# Patient Record
Sex: Female | Born: 1950 | Race: White | Hispanic: No | Smoking: Never smoker
Health system: Southern US, Community
[De-identification: ages and names within clinical notes are randomized; demographics above are authoritative.]

## PROBLEM LIST (undated history)

## (undated) DIAGNOSIS — G4733 Obstructive sleep apnea (adult) (pediatric): Secondary | ICD-10-CM

## (undated) DIAGNOSIS — D6851 Activated protein C resistance: Secondary | ICD-10-CM

## (undated) DIAGNOSIS — I10 Essential (primary) hypertension: Secondary | ICD-10-CM

## (undated) DIAGNOSIS — I4819 Other persistent atrial fibrillation: Secondary | ICD-10-CM

## (undated) DIAGNOSIS — Z86718 Personal history of other venous thrombosis and embolism: Secondary | ICD-10-CM

## (undated) DIAGNOSIS — K219 Gastro-esophageal reflux disease without esophagitis: Secondary | ICD-10-CM

## (undated) DIAGNOSIS — E782 Mixed hyperlipidemia: Secondary | ICD-10-CM

## (undated) DIAGNOSIS — Z86711 Personal history of pulmonary embolism: Secondary | ICD-10-CM

## (undated) DIAGNOSIS — Z9289 Personal history of other medical treatment: Secondary | ICD-10-CM

## (undated) DIAGNOSIS — Z9889 Other specified postprocedural states: Secondary | ICD-10-CM

## (undated) DIAGNOSIS — J309 Allergic rhinitis, unspecified: Secondary | ICD-10-CM

## (undated) DIAGNOSIS — Z9981 Dependence on supplemental oxygen: Secondary | ICD-10-CM

## (undated) DIAGNOSIS — M1990 Unspecified osteoarthritis, unspecified site: Secondary | ICD-10-CM

## (undated) HISTORY — DX: Obstructive sleep apnea (adult) (pediatric): G47.33

## (undated) HISTORY — DX: Activated protein C resistance: D68.51

## (undated) HISTORY — DX: Allergic rhinitis, unspecified: J30.9

## (undated) HISTORY — DX: Personal history of pulmonary embolism: Z86.711

## (undated) HISTORY — DX: Essential (primary) hypertension: I10

## (undated) HISTORY — PX: HERNIA REPAIR: SHX51

## (undated) HISTORY — DX: Other persistent atrial fibrillation: I48.19

## (undated) HISTORY — PX: CHOLECYSTECTOMY: SHX55

## (undated) HISTORY — DX: Personal history of other venous thrombosis and embolism: Z86.718

## (undated) HISTORY — DX: Mixed hyperlipidemia: E78.2

## (undated) HISTORY — DX: Other specified postprocedural states: Z98.890

## (undated) HISTORY — PX: GANGLION CYST EXCISION: SHX1691

## (undated) HISTORY — DX: Gastro-esophageal reflux disease without esophagitis: K21.9

## (undated) HISTORY — DX: Unspecified osteoarthritis, unspecified site: M19.90

## (undated) HISTORY — PX: OTHER SURGICAL HISTORY: SHX169

## (undated) HISTORY — PX: ENDOMETRIAL ABLATION: SHX621

## (undated) HISTORY — PX: ROTATOR CUFF REPAIR: SHX139

---

## 1997-04-21 ENCOUNTER — Other Ambulatory Visit: Admission: RE | Admit: 1997-04-21 | Discharge: 1997-04-21 | Payer: Self-pay | Admitting: *Deleted

## 1997-05-17 ENCOUNTER — Inpatient Hospital Stay (HOSPITAL_COMMUNITY): Admission: AD | Admit: 1997-05-17 | Discharge: 1997-05-19 | Payer: Self-pay | Admitting: *Deleted

## 1997-06-16 ENCOUNTER — Inpatient Hospital Stay (HOSPITAL_COMMUNITY): Admission: EM | Admit: 1997-06-16 | Discharge: 1997-06-20 | Payer: Self-pay | Admitting: Internal Medicine

## 1998-09-14 ENCOUNTER — Other Ambulatory Visit: Admission: RE | Admit: 1998-09-14 | Discharge: 1998-09-14 | Payer: Self-pay | Admitting: *Deleted

## 1998-12-04 ENCOUNTER — Ambulatory Visit (HOSPITAL_COMMUNITY): Admission: RE | Admit: 1998-12-04 | Discharge: 1998-12-04 | Payer: Self-pay | Admitting: *Deleted

## 1998-12-04 ENCOUNTER — Encounter: Payer: Self-pay | Admitting: *Deleted

## 1998-12-04 ENCOUNTER — Inpatient Hospital Stay (HOSPITAL_COMMUNITY): Admission: EM | Admit: 1998-12-04 | Discharge: 1998-12-06 | Payer: Self-pay | Admitting: Emergency Medicine

## 1999-02-25 ENCOUNTER — Encounter (INDEPENDENT_AMBULATORY_CARE_PROVIDER_SITE_OTHER): Payer: Self-pay | Admitting: *Deleted

## 1999-02-25 ENCOUNTER — Inpatient Hospital Stay (HOSPITAL_COMMUNITY): Admission: EM | Admit: 1999-02-25 | Discharge: 1999-03-07 | Payer: Self-pay | Admitting: Emergency Medicine

## 1999-02-25 ENCOUNTER — Encounter: Payer: Self-pay | Admitting: *Deleted

## 1999-02-27 ENCOUNTER — Encounter: Payer: Self-pay | Admitting: *Deleted

## 1999-03-22 ENCOUNTER — Encounter: Payer: Self-pay | Admitting: *Deleted

## 1999-03-22 ENCOUNTER — Encounter: Admission: RE | Admit: 1999-03-22 | Discharge: 1999-03-22 | Payer: Self-pay | Admitting: *Deleted

## 1999-11-14 ENCOUNTER — Encounter: Admission: RE | Admit: 1999-11-14 | Discharge: 1999-11-14 | Payer: Self-pay | Admitting: Internal Medicine

## 1999-11-14 ENCOUNTER — Encounter: Payer: Self-pay | Admitting: Internal Medicine

## 2000-01-01 ENCOUNTER — Ambulatory Visit (HOSPITAL_COMMUNITY): Admission: RE | Admit: 2000-01-01 | Discharge: 2000-01-01 | Payer: Self-pay | Admitting: *Deleted

## 2000-03-12 ENCOUNTER — Ambulatory Visit (HOSPITAL_COMMUNITY): Admission: RE | Admit: 2000-03-12 | Discharge: 2000-03-12 | Payer: Self-pay | Admitting: Physician Assistant

## 2000-03-12 ENCOUNTER — Encounter: Payer: Self-pay | Admitting: *Deleted

## 2000-07-02 ENCOUNTER — Encounter: Payer: Self-pay | Admitting: Internal Medicine

## 2000-07-02 ENCOUNTER — Encounter: Admission: RE | Admit: 2000-07-02 | Discharge: 2000-07-02 | Payer: Self-pay | Admitting: Internal Medicine

## 2000-09-02 ENCOUNTER — Encounter: Payer: Self-pay | Admitting: Internal Medicine

## 2000-09-02 ENCOUNTER — Encounter: Admission: RE | Admit: 2000-09-02 | Discharge: 2000-09-02 | Payer: Self-pay | Admitting: Internal Medicine

## 2000-10-21 ENCOUNTER — Encounter: Admission: RE | Admit: 2000-10-21 | Discharge: 2000-10-21 | Payer: Self-pay | Admitting: Neurosurgery

## 2000-10-21 ENCOUNTER — Encounter: Payer: Self-pay | Admitting: Neurosurgery

## 2000-11-28 ENCOUNTER — Inpatient Hospital Stay (HOSPITAL_COMMUNITY): Admission: RE | Admit: 2000-11-28 | Discharge: 2000-12-03 | Payer: Self-pay | Admitting: Orthopedic Surgery

## 2000-11-28 ENCOUNTER — Encounter: Payer: Self-pay | Admitting: Orthopedic Surgery

## 2000-11-28 ENCOUNTER — Encounter (INDEPENDENT_AMBULATORY_CARE_PROVIDER_SITE_OTHER): Payer: Self-pay

## 2001-02-26 ENCOUNTER — Other Ambulatory Visit: Admission: RE | Admit: 2001-02-26 | Discharge: 2001-02-26 | Payer: Self-pay | Admitting: Internal Medicine

## 2001-03-23 ENCOUNTER — Encounter: Admission: RE | Admit: 2001-03-23 | Discharge: 2001-03-23 | Payer: Self-pay | Admitting: Internal Medicine

## 2001-03-23 ENCOUNTER — Encounter: Payer: Self-pay | Admitting: Internal Medicine

## 2001-03-26 ENCOUNTER — Encounter: Admission: RE | Admit: 2001-03-26 | Discharge: 2001-03-26 | Payer: Self-pay | Admitting: Internal Medicine

## 2001-03-26 ENCOUNTER — Encounter: Payer: Self-pay | Admitting: Internal Medicine

## 2001-04-15 ENCOUNTER — Ambulatory Visit (HOSPITAL_COMMUNITY): Admission: RE | Admit: 2001-04-15 | Discharge: 2001-04-15 | Payer: Self-pay | Admitting: Gastroenterology

## 2001-04-15 ENCOUNTER — Encounter (INDEPENDENT_AMBULATORY_CARE_PROVIDER_SITE_OTHER): Payer: Self-pay | Admitting: Specialist

## 2002-02-25 ENCOUNTER — Encounter: Payer: Self-pay | Admitting: Internal Medicine

## 2002-02-25 ENCOUNTER — Encounter: Admission: RE | Admit: 2002-02-25 | Discharge: 2002-02-25 | Payer: Self-pay | Admitting: Internal Medicine

## 2002-04-08 ENCOUNTER — Encounter: Admission: RE | Admit: 2002-04-08 | Discharge: 2002-04-08 | Payer: Self-pay | Admitting: Internal Medicine

## 2002-04-08 ENCOUNTER — Encounter: Payer: Self-pay | Admitting: Internal Medicine

## 2003-04-11 ENCOUNTER — Encounter: Admission: RE | Admit: 2003-04-11 | Discharge: 2003-04-11 | Payer: Self-pay | Admitting: Internal Medicine

## 2003-06-28 ENCOUNTER — Other Ambulatory Visit: Admission: RE | Admit: 2003-06-28 | Discharge: 2003-06-28 | Payer: Self-pay | Admitting: Internal Medicine

## 2003-12-23 ENCOUNTER — Encounter: Admission: RE | Admit: 2003-12-23 | Discharge: 2003-12-23 | Payer: Self-pay | Admitting: Internal Medicine

## 2004-04-11 ENCOUNTER — Encounter: Admission: RE | Admit: 2004-04-11 | Discharge: 2004-04-11 | Payer: Self-pay | Admitting: Internal Medicine

## 2004-05-15 ENCOUNTER — Encounter (INDEPENDENT_AMBULATORY_CARE_PROVIDER_SITE_OTHER): Payer: Self-pay | Admitting: *Deleted

## 2004-05-15 ENCOUNTER — Ambulatory Visit (HOSPITAL_COMMUNITY): Admission: RE | Admit: 2004-05-15 | Discharge: 2004-05-15 | Payer: Self-pay | Admitting: Gastroenterology

## 2005-04-12 ENCOUNTER — Ambulatory Visit (HOSPITAL_COMMUNITY): Admission: RE | Admit: 2005-04-12 | Discharge: 2005-04-13 | Payer: Self-pay | Admitting: Orthopedic Surgery

## 2005-11-12 ENCOUNTER — Ambulatory Visit (HOSPITAL_COMMUNITY): Admission: RE | Admit: 2005-11-12 | Discharge: 2005-11-12 | Payer: Self-pay | Admitting: Internal Medicine

## 2005-11-12 ENCOUNTER — Encounter: Payer: Self-pay | Admitting: Internal Medicine

## 2005-11-21 ENCOUNTER — Encounter: Admission: RE | Admit: 2005-11-21 | Discharge: 2005-11-21 | Payer: Self-pay | Admitting: Obstetrics & Gynecology

## 2005-12-02 ENCOUNTER — Encounter: Admission: RE | Admit: 2005-12-02 | Discharge: 2005-12-02 | Payer: Self-pay | Admitting: Internal Medicine

## 2006-01-06 ENCOUNTER — Encounter (HOSPITAL_COMMUNITY): Admission: RE | Admit: 2006-01-06 | Discharge: 2006-01-07 | Payer: Self-pay | Admitting: Cardiology

## 2006-10-18 ENCOUNTER — Emergency Department (HOSPITAL_COMMUNITY): Admission: EM | Admit: 2006-10-18 | Discharge: 2006-10-18 | Payer: Self-pay | Admitting: Emergency Medicine

## 2006-12-02 ENCOUNTER — Encounter: Admission: RE | Admit: 2006-12-02 | Discharge: 2006-12-02 | Payer: Self-pay | Admitting: Obstetrics & Gynecology

## 2007-04-28 ENCOUNTER — Ambulatory Visit: Payer: Self-pay | Admitting: Internal Medicine

## 2007-04-28 DIAGNOSIS — J209 Acute bronchitis, unspecified: Secondary | ICD-10-CM

## 2007-04-28 DIAGNOSIS — G4733 Obstructive sleep apnea (adult) (pediatric): Secondary | ICD-10-CM

## 2007-04-28 DIAGNOSIS — E662 Morbid (severe) obesity with alveolar hypoventilation: Secondary | ICD-10-CM

## 2007-05-03 DIAGNOSIS — I82409 Acute embolism and thrombosis of unspecified deep veins of unspecified lower extremity: Secondary | ICD-10-CM | POA: Insufficient documentation

## 2007-05-03 DIAGNOSIS — I10 Essential (primary) hypertension: Secondary | ICD-10-CM | POA: Insufficient documentation

## 2007-05-03 DIAGNOSIS — J45909 Unspecified asthma, uncomplicated: Secondary | ICD-10-CM

## 2007-05-03 DIAGNOSIS — I2699 Other pulmonary embolism without acute cor pulmonale: Secondary | ICD-10-CM

## 2007-05-03 DIAGNOSIS — K219 Gastro-esophageal reflux disease without esophagitis: Secondary | ICD-10-CM

## 2007-05-03 DIAGNOSIS — E785 Hyperlipidemia, unspecified: Secondary | ICD-10-CM

## 2007-05-05 ENCOUNTER — Ambulatory Visit: Payer: Self-pay | Admitting: Internal Medicine

## 2007-05-28 ENCOUNTER — Ambulatory Visit: Payer: Self-pay | Admitting: Internal Medicine

## 2007-05-29 ENCOUNTER — Encounter: Payer: Self-pay | Admitting: Internal Medicine

## 2007-05-29 ENCOUNTER — Ambulatory Visit (HOSPITAL_BASED_OUTPATIENT_CLINIC_OR_DEPARTMENT_OTHER): Admission: RE | Admit: 2007-05-29 | Discharge: 2007-05-29 | Payer: Self-pay | Admitting: Internal Medicine

## 2007-05-30 ENCOUNTER — Ambulatory Visit: Payer: Self-pay | Admitting: Internal Medicine

## 2007-06-29 ENCOUNTER — Ambulatory Visit: Payer: Self-pay | Admitting: Internal Medicine

## 2007-07-17 ENCOUNTER — Encounter: Payer: Self-pay | Admitting: Internal Medicine

## 2007-07-29 ENCOUNTER — Ambulatory Visit: Payer: Self-pay | Admitting: Internal Medicine

## 2007-08-11 ENCOUNTER — Encounter: Payer: Self-pay | Admitting: Internal Medicine

## 2007-10-04 ENCOUNTER — Emergency Department (HOSPITAL_COMMUNITY): Admission: EM | Admit: 2007-10-04 | Discharge: 2007-10-05 | Payer: Self-pay | Admitting: Emergency Medicine

## 2007-11-18 ENCOUNTER — Encounter: Admission: RE | Admit: 2007-11-18 | Discharge: 2007-11-18 | Payer: Self-pay | Admitting: Orthopedic Surgery

## 2007-12-04 ENCOUNTER — Encounter: Admission: RE | Admit: 2007-12-04 | Discharge: 2007-12-04 | Payer: Self-pay | Admitting: Internal Medicine

## 2007-12-09 ENCOUNTER — Ambulatory Visit (HOSPITAL_COMMUNITY): Admission: RE | Admit: 2007-12-09 | Discharge: 2007-12-09 | Payer: Self-pay | Admitting: Orthopedic Surgery

## 2007-12-15 ENCOUNTER — Ambulatory Visit: Admission: RE | Admit: 2007-12-15 | Discharge: 2007-12-15 | Payer: Self-pay | Admitting: Orthopedic Surgery

## 2007-12-15 ENCOUNTER — Encounter (INDEPENDENT_AMBULATORY_CARE_PROVIDER_SITE_OTHER): Payer: Self-pay | Admitting: Orthopedic Surgery

## 2007-12-15 ENCOUNTER — Ambulatory Visit: Payer: Self-pay | Admitting: Surgery

## 2008-01-29 ENCOUNTER — Ambulatory Visit: Payer: Self-pay | Admitting: Internal Medicine

## 2008-07-31 ENCOUNTER — Encounter: Payer: Self-pay | Admitting: Internal Medicine

## 2008-12-11 ENCOUNTER — Emergency Department (HOSPITAL_COMMUNITY): Admission: EM | Admit: 2008-12-11 | Discharge: 2008-12-11 | Payer: Self-pay | Admitting: Emergency Medicine

## 2008-12-13 ENCOUNTER — Emergency Department (HOSPITAL_COMMUNITY): Admission: EM | Admit: 2008-12-13 | Discharge: 2008-12-13 | Payer: Self-pay | Admitting: Emergency Medicine

## 2008-12-30 ENCOUNTER — Encounter: Admission: RE | Admit: 2008-12-30 | Discharge: 2008-12-30 | Payer: Self-pay | Admitting: Internal Medicine

## 2009-05-04 ENCOUNTER — Ambulatory Visit (HOSPITAL_COMMUNITY): Admission: RE | Admit: 2009-05-04 | Discharge: 2009-05-04 | Payer: Self-pay | Admitting: Gastroenterology

## 2009-08-10 ENCOUNTER — Encounter: Payer: Self-pay | Admitting: Internal Medicine

## 2009-09-21 ENCOUNTER — Encounter: Payer: Self-pay | Admitting: Internal Medicine
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
## 2010-02-22 NOTE — Medication Information (Signed)
Summary: [name]  [name]   Imported By: Sherian Rein 08/16/2009 08:26:34  _____________________________________________________________________  External Attachment:    Type:   Image     Comment:   External Document

## 2010-02-22 NOTE — Letter (Signed)
Summary: [name]  [name]   Imported By: Lester [place] 08/23/2009 10:02:55  _____________________________________________________________________  External Attachment:    Type:   Image     Comment:   External Document

## 2010-02-22 NOTE — Letter (Signed)
Summary: [name]  [name]   Imported By: Sherian Rein 10/02/2009 08:07:37  _____________________________________________________________________  External Attachment:    Type:   Image     Comment:   External Document

## 2010-03-13 ENCOUNTER — Encounter: Payer: Self-pay | Admitting: Internal Medicine

## 2010-03-20 ENCOUNTER — Ambulatory Visit
Admission: RE | Admit: 2010-03-20 | Discharge: 2010-03-20 | Disposition: A | Discharge status: Home / Self Care | Payer: Medicare HMO | Source: Ambulatory Visit | Attending: Internal Medicine | Admitting: Internal Medicine

## 2010-03-20 ENCOUNTER — Other Ambulatory Visit: Payer: Self-pay | Admitting: Internal Medicine

## 2010-03-20 DIAGNOSIS — R0602 Shortness of breath: Secondary | ICD-10-CM

## 2010-03-20 MED ORDER — IOHEXOL 350 MG/ML SOLN
125.0000 mL | Freq: Once | INTRAVENOUS | Status: AC | PRN
  Administered 2010-03-20: 125 mL via INTRAVENOUS

## 2010-03-22 ENCOUNTER — Encounter: Payer: Self-pay | Admitting: Internal Medicine

## 2010-03-27 ENCOUNTER — Encounter: Payer: Self-pay | Admitting: Internal Medicine

## 2010-03-27 ENCOUNTER — Ambulatory Visit (INDEPENDENT_AMBULATORY_CARE_PROVIDER_SITE_OTHER): Payer: Medicare HMO | Admitting: Internal Medicine

## 2010-03-27 DIAGNOSIS — J9611 Chronic respiratory failure with hypoxia: Secondary | ICD-10-CM | POA: Insufficient documentation

## 2010-03-27 DIAGNOSIS — I2699 Other pulmonary embolism without acute cor pulmonale: Secondary | ICD-10-CM

## 2010-03-27 DIAGNOSIS — R0602 Shortness of breath: Secondary | ICD-10-CM

## 2010-03-27 DIAGNOSIS — G4730 Sleep apnea, unspecified: Secondary | ICD-10-CM

## 2010-04-03 NOTE — Assessment & Plan Note (Signed)
Summary: sob per dr [place] Cross office/[facility]   Copy to:  Donette Larry Primary Provider/Referring Provider:  Ginette Otto  CC:  Increased SOB x 2 weeks and getting worse; Dr Mayford Knife suggested pt be seen.Marland Kitchen  History of Present Illness:  07/29/07-  60 year old, morbidly obese woman returning for follow-up of asthmatic bronchitis and chronic respiratory failure, complicated by a history of pulmonary embolism, and obesity/hypoventilation syndrome.  Her husband is with her.  She uses oxygen continuously.  Comfortable with CPAP at 7 CWP.  She sleeps comfortably using oxygen with her CPAP.  Questions if she might be catching a cold, has started a little cough and wheeze.  She denies sore throat, fever, purulent discharge, chest pain or palpitation, GI or GU upset.  01/29/08- Asthmatic bronchitis, Hx PE, OSA, Morbid obesity    Husband with her. Has had seasonal flu vax only. Some morning wheeze past few days - mild. Has been doing well without routine cogh or phlegm. denies chest pain, palpitation, fever.  Over past month is getting up to bathroom more at night. Her autotitration cpap is set range 7-9 cwp. She is able to remain compliant, using it every night.  March 27, 2010-  Asthmatic bronchitis, Hx PE, OSA, Morbid obesity     Nurse-CC: Increased SOB x 2 weeks and getting worse; Dr Mayford Knife suggested pt be seen. Since first of this year she has been gradually more short of breath. By 2 weeks ago when she went to Dr Eula Listen, she was hoarse w/o cold, chest described as clear. CT scan was clear. He increased her O2 to 3 L, gave prednisone taper (finished) and sent to Dr Laurel Dimmer. Echocardiogram and EKG  "ok". Getting better, but still band-like tightness around chest, increased work of breathing. Denies sore throat, fever, cough or phlegm. Chest did feel acheiness though chest, relieved by neb trreatment. Denies blood, nausea or vomiting. Does get heartburn- takes pepcid 2-3x daily. Wheezes routinely some with exertion,  otw clear.      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 One Inhalation Two Times A Day 2)  Allegra 180 Mg  Tabs (Fexofenadine Hcl) .... Q Day As Needed 3)  Albuterol 90 Mcg/act  Aers (Albuterol) .... As Needed 4)  Albuterol Sulfate 1.25 Mg/55ml  Nebu (Albuterol Sulfate) .... As Needed 5)  Pravastatin Sodium 80 Mg  Tabs (Pravastatin Sodium) .... Take 1 Tablet By Mouth Once A Day 6)  Flonase 50 Mcg/act  Susp (Fluticasone Propionate) .... Two Sprays Daily 7)  Furosemide 20 Mg  Tabs (Furosemide) .... As Needed 8)  Diltiazem Hcl Coated Beads 300 Mg  Cp24 (Diltiazem Hcl Coated  Beads) .... Take 1 Tablet By Mouth Once A Day 9)  Pepcid 20 Mg  Tabs (Famotidine) .... As Needed 10)  Coumadin 5 Mg  Tabs (Warfarin Sodium) .... 2 Tabs Daily Use As Directed 11)  Cpap 7 12)  Oxygen 2 L/m 13)  Pulmicort 0.5 Mg/62ml  Susp (Budesonide) .... Inhaled As Needed  Current Medications (verified): 1)  Advair Diskus 500-50 Mcg/dose  Misc (Fluticasone-Salmeterol) .... One Inhalation Two Times A Day 2)  Allegra 180 Mg  Tabs (Fexofenadine Hcl) .... Q Day As Needed 3)   Albuterol 90 Mcg/act  Aers (Albuterol) .... As Needed 4)  Albuterol Sulfate 1.25 Mg/70ml  Nebu (Albuterol Sulfate) .... As Needed 5)  Pravastatin Sodium 80 Mg  Tabs (Pravastatin Sodium) .... Take 1 Tablet By Mouth Once A Day 6)  Flonase 50 Mcg/act  Susp (Fluticasone Propionate) .... Two Sprays Daily 7)  Furosemide 20 Mg  Tabs (Furosemide) .... As Needed 8)  Diltiazem Hcl Coated Beads 300 Mg  Cp24 (Diltiazem Hcl Coated Beads) .... Take 1 Tablet By Mouth Once A Day 9)  Pepcid 20 Mg  Tabs (Famotidine) .... As Needed 10)  Coumadin 5 Mg  Tabs (Warfarin Sodium) .... 2 Tabs Daily Use As Directed 11)  Cpap 7 .... [facility] 12)  Oxygen 3 L/m .... [facility]  Allergies (verified): No Known Drug Allergies  Past History:  Past Medical History: Last updated: 06/29/2007 ASTHMA (ICD-493.90) BRONCHITIS (ICD-490) Hx of PULMONARY EMBOLISM (ICD-415.19) Hx of DEEP VENOUS THROMBOPHLEBITIS, CHRONIC (ICD-453.40) OBESITY HYPOVENTILATION SYNDROME (ICD-278.8) SLEEP APNEA (ICD-780.57) NPSG 05/29/07 AHI 32.9, cpap 7- AHI 0/hr weight 382. ESSENTIAL HYPERTENSION, BENIGN (ICD-401.1) HYPERLIPIDEMIA (ICD-272.4) GERD (ICD-530.81)    Past Surgical History: Last updated: 04/28/2007 IVC filter Ganglion cyst Hernia repair hernia abscess I&D hernia incisional abscess Endometrial ablation Rotator cuff  Family History: Last updated: 06/29/2007 Allergies Cancer Family History Asthma Family History Coronary Heart Disease Family History COPD   Mother- deceased age 45; thyroid cancer Father- deceased age 62; heart attack 62 Siblings- 1 deceased  Social History: Last updated: 03/27/2010 Patient states former smoker only as a teen for 6 months widowed ( cirrhosis) disability   Risk Factors: Smoking Status: never (03/27/2010)  Social History: Patient states former smoker only as a teen for 6 months widowed ( cirrhosis) disability  Smoking Status:  never  Review of Systems       See HPI       The patient complains of shortness of breath with activity, acid heartburn, and indigestion.  The patient denies shortness of breath at rest, productive cough, non-productive cough, coughing up blood, chest pain, irregular heartbeats, loss of appetite, weight change, abdominal pain, difficulty swallowing, sore throat, tooth/dental problems, headaches, nasal congestion/difficulty breathing through nose, and sneezing.    Vital Signs:  Patient profile:   60 year old female Height:      68 inches Weight:      398.50 pounds BMI:     60.81 O2 Sat:      99 % on 3 L/min Pulse rate:   74 / minute BP sitting:   134 / 78  (left arm) Cuff size:   wrist-lg cuff  Vitals Entered By: Reynaldo Minium CMA (March 27, 2010 11:35 AM)  O2 Flow:  3 L/min CC: Increased SOB x 2 weeks and getting worse; Dr Mayford Knife suggested pt be seen.   Physical Exam  Additional Exam:  General: A/Ox3; pleasant and cooperative, NAD, morbidly obese. supplemental oxygen- 2 L/M demand valve 99%sat SKIN:  no rash, lesions NODES: no lymphadenopathy HEENT: [place]/AT, EOM- WNL, Conjuctivae- clear, PERRLA, TM-WNL, Nose- clear, Throat- clear and wnl, partial.  NECK: Supple w/ fair ROM, JVD- none, normal carotid impulses w/o bruits Thyroid- normal to palpation CHEST: Clear to P&A HEART: RRR, no m/g/r heard ABDOMEN: Massive pannus ZOX:WRUE, nl pulses, no edema  NEURO: Grossly intact to observation      Impression & Recommendations:  Problem # 1:  DYSPNEA (ICD-786.05)  Recent sustained increased dyspnea over a two month period, slowly improving. Absent exacerbation of her several known related medical problems, this may have been a viral bronchits, without much wheesze or cough. It may have been nonrespiratory- she admits ongoing GERD symptoms at times, and may hve been feeling an esophagitis. The latter would fit with negative cardiac and PE testing, and with clear lungs on exams. I will have her be more aggressive  with Pepcid next 3 weeks and watch for progress.   Problem # 2:  SLEEP APNEA (ICD-780.57)  Good complince and control with CPAP.   Problem # 3:  Hx of PULMONARY EMBOLISM (ICD-415.19) No recurrence evident. If no other explanation for dyspnea becomes available, then we will have to reconsider.  Her updated medication list for this problem includes:    Coumadin 5 Mg Tabs (Warfarin sodium) .Marland Kitchen... 2 tabs daily use as directed  Medications Added to Medication List This Visit: 1)  Cpap 7  .... [name] 2)  Oxygen 3 L/m  .... [name]  Other Orders: Est. Patient Level IV (45409)  Patient Instructions: 1)  Please schedule a follow-up appointment in 1 year.Please call sooner as needed. 2)  Some of the chest discomfort might be from reflux. Try taking the Pepcid 3 times daily x 2 weeks, then drop back to once daily.

## 2010-04-10 NOTE — Letter (Signed)
Summary: [facility]  [facility]   Imported By: Sherian Rein 04/03/2010 08:27:18  _____________________________________________________________________  External Attachment:    Type:   Image     Comment:   External Document

## 2010-04-25 LAB — DIFFERENTIAL
Basophils Absolute: 0 10*3/uL (ref 0.0–0.1)
Basophils Relative: 0 % (ref 0–1)
Eosinophils Relative: 1 % (ref 0–5)
Monocytes Absolute: 0.4 10*3/uL (ref 0.1–1.0)
Monocytes Relative: 7 % (ref 3–12)
Neutro Abs: 3.9 10*3/uL (ref 1.7–7.7)

## 2010-04-25 LAB — BASIC METABOLIC PANEL
CO2: 26 mEq/L (ref 19–32)
Chloride: 107 mEq/L (ref 96–112)
GFR calc Af Amer: 57 mL/min — ABNORMAL LOW (ref >60)
Glucose, Bld: 108 mg/dL — ABNORMAL HIGH (ref 70–99)
Potassium: 4.3 mEq/L (ref 3.5–5.1)
Sodium: 140 mEq/L (ref 135–145)

## 2010-04-25 LAB — CBC
HCT: 36.3 % (ref 36.0–46.0)
Hemoglobin: 12 g/dL (ref 12.0–15.0)
MCHC: 33 g/dL (ref 30.0–36.0)
MCV: 91.8 fL (ref 78.0–100.0)
RBC: 3.95 MIL/uL (ref 3.87–5.11)
RDW: 14 % (ref 11.5–15.5)

## 2010-06-05 NOTE — Procedures (Signed)
NAME:  Rhonda Acevedo, Rhonda Acevedo NO.:  [id number]   MEDICAL RECORD NO.:  [id number]          PATIENT TYPE:  OUT   LOCATION:  SLEEP CENTER                 FACILITY:  [facility]   PHYSICIAN:  Clinton D. Maple Hudson, MD, FCCP, FACPDATE OF BIRTH:  1950-06-15   DATE OF STUDY:  05/29/2007                            NOCTURNAL POLYSOMNOGRAM   REFERRING PHYSICIAN:  Clinton D. Young, MD, FCCP, FACP   INDICATION FOR STUDY:  Hypersomnia with sleep apnea.   EPWORTH SLEEPINESS SCORE:  10/24.   BMI:  58.   WEIGHT:  382 pounds.   HEIGHT:  68 inches.   NECK:  18-1/2 inches.   HOME MEDICATIONS:  Charted and reviewed.   SLEEP ARCHITECTURE:  Split study protocol.  During the diagnostic phase,  total sleep time was 120.5 minutes with sleep efficiency 67.1%.  Stage 1  was 13.3%.  Stage 2 was 86.7%.  Stage 3 was absent.  REM was absent.  Sleep latency 15.5 minutes.  Awake after sleep onset 43.5 minutes.  Arousal index 22.9.  Albuterol nebulizer was taken at bedtime.   RESPIRATORY DATA:  Split study protocol.  Apnea/hypopnea index (AHI)  32.9, indicating moderate obstructive sleep apnea/hypopnea syndrome.  Sixty-six events were scored, including 11 obstructive apneas and 55  hypopneas.  Events were more common while nonsupine.  CPAP was titrated  to 7 CWP, AHI 0.4 per hour.  A small Mirage Quattro mask was used with  heated humidifier.   OXYGEN DATA:  Snoring was suppressed by CPAP but was loud before CPAP.  She arrived on oxygen at 2 L.  She stated that she only used oxygen when  moving around a lot.  The study was performed on room air.  Before CPAP,  oxygen desaturation to a nadir of 80%.  After CPAP titration, mean  oxygen saturation 90.7% on room air.   CARDIAC DATA:  Normal sinus rhythm.   MOVEMENT/PARASOMNIA:  No significant movement disturbance.  Bathroom x1.   IMPRESSIONS/RECOMMENDATIONS:  1. Moderate obstructive sleep apnea/hypopnea syndrome, AHI 32.9 per      hour with events more  common while nonsupine.  Loud snoring with      oxygen desaturation to a nadir of 80%.  2. Successful CPAP titration to 7 CWP, AHI 0.4 per hour.  She chose a      small Mirage Quattro mask with heated humidifier.  3. She arrived wearing oxygen at 2 L but said she did not need not it      while not physically active.  The study was performed on room air.      Oxygen desaturated before CPAP to a nadir of 80% with a mean of      90.1%.  After CPAP titration, mean oxygen saturation was 90.7% on      room air.      Clinton D. Maple Hudson, MD, [facility], FACP  Diplomate, Biomedical engineer of Sleep Medicine  Electronically Signed     CDY/MEDQ  D:  05/30/2007 08:37:23  T:  05/30/2007 09:01:22  Job:  [id number]

## 2010-06-05 NOTE — Op Note (Signed)
NAMESAKINAH, [name]               ACCOUNT NO.:  [id number]   MEDICAL RECORD NO.:  [id number]          PATIENT TYPE:  AMB   LOCATION:  SDS                          FACILITY:  [facility]   PHYSICIAN:  Harvie Junior, M.D.   DATE OF BIRTH:  16-Aug-1950   DATE OF PROCEDURE:  12/09/2007  DATE OF DISCHARGE:                               OPERATIVE REPORT   PREOPERATIVE DIAGNOSIS:  Medial meniscal tear.   POSTOPERATIVE DIAGNOSES:  1. Chondral defect of medial femoral condyle.  2. Chondromalacia of the patellofemoral joint.  3. Lateral meniscal tear with chondromalacia of lateral tibial      plateau.   PROCEDURE:  1. Arthroscopic knee surgery with debridement of lateral tibial      plateau and lateral meniscal tear.  2. Arthroscopic debridement of medial femoral condyle.  3. Arthroscopic debridement of patellofemoral joint.   SURGEON:  Harvie Junior, M.D.   ASSISTANT:  Marshia Ly, P.A.   ANESTHESIA:  General.   BRIEF HISTORY:  [name] is a 60 year old female with long history of  having had significant left knee pain, was treated conservatively  including activity modification, drapes and therapy.  Because of all  this failure, she was ultimately taken to the operating room for  operative knee arthroplasty.  Preoperative MRI showed questionable  medial meniscal tear with chondral problems on both medial and  patellofemoral joint.   PROCEDURE:  The patient was taken to the operating room.  After adequate  anesthesia obtained with a general anesthetic, the patient was placed  supine on the operating table.  The left lower extremity was prepped and  draped in usual sterile fashion.  Following this, a routine arthroscopic  examination of knee revealed that there was obvious chondromalacia of  the patellofemoral joint.  This was debrided back to a smooth and stable  rim.  Attention was turned to the medial compartment where there was  flaps of cartilage on the medial femoral condyle,  which were debrided  back to a smooth and stable rim.  Medial meniscus was probed at length  and felt to be stable. There was some grade 3-4 change right on the  medial tibial plateau as well, but the medial meniscus was allowed to be  left at this time.  Attention was turned towards the lateral compartment  where there was partial lateral meniscectomy which was performed as well  as debridement of the lateral tibial plateau.  At this time attention  was drawn back on the patellofemoral joint where the tracking was  midline.  There was no significant pressure on patellofemoral joint, so  we just debrided the remainder of the patellofemoral joint.  At this  point, the knee was copiously and thoroughly  irrigated, suctioned dry.  All sterile portals were closed with a  bandage.  Sterile compression dressing was applied.  The patient was  taken to the recovery room and was noted to be in satisfactory  condition.  Estimated blood loss for procedure was about __________      Harvie Junior, M.D.  Electronically Signed     JLG/MEDQ  D:  12/09/2007  T:  12/09/2007  Job:  [id number]

## 2010-06-08 NOTE — H&P (Signed)
[facility]  Patient:    Rhonda Acevedo, Rhonda Acevedo Visit Number: [id number] MRN: [id number]          Service Type: Attending:  Elisha Ponder, M.D. Dictated by:   Alexzandrew L. Julien Girt, P.A.-C.   CC:         Tyson Dense, M.D.   History and Physical  CHIEF COMPLAINT:  Right wrist pain.  HISTORY OF PRESENT ILLNESS:  The patient is a 60 year old female who has been seen and evaluated by Dr. Onalee Hua for right wrist pain.  The pain has been ongoing for several months now, since February 2002.  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  Specific arrangements had to be made with her medical physician, Dr. Eula Listen, due to the fact of limiting the amount of time where she is not anticoagulated due to her significant history.  The pain has been ongoing for some time now, and she has elected to proceed with surgical intervention.  Arrangements were made for the patient to be admitted prior to her procedure and taken off her Coumadin and undergo concurrent coverage with Lovenox until the day of her procedure.  The risks and benefits of this procedure have been discussed with the patient, and she has elected to proceed with surgery.  ALLERGIES:  No known drug allergies.  CURRENT MEDICATIONS: 1. She has been on Coumadin 10 mg/7.5 mg alternating doses on alternating    days. 2. Detrol LA 4 mg daily. 3. Advair 250/50 1 puff twice a day. 4. Humibid L.A./guaifenesin 600 mg twice a day. 5. Septra DS p.o. b.i.d. for seven days. 6. Ferrous sulfate 325 mg twice a day.  PAST MEDICAL HISTORY: 1. Recently diagnosed with urinary incontinence with a stress urinary    incontinent component. 2. Anemia. 3. Reflux disease. 4. Varicose veins. 5. Pulmonary embolism x 2.  6. Factor V deficiency.  7. Asthma.  8.  Degenerative disk disease.  9. Diagnosed with bursitis in her shoulders and knees and, also, arthritis     in her hips. 10. History of sleep apnea.  PAST SURGICAL HISTORY:  Cholecystectomy.  SOCIAL HISTORY:  She is married, has two children.  Denies use of tobacco products or alcohol products.  FAMILY HISTORY:  Mother deceased in her 56s with cancer.  Father deceased in his 91s with heart attack and diabetes.  REVIEW OF SYSTEMS:  GENERAL:  No fevers, chills, night sweats.  NEUROLOGIC: No seizures, syncope, paralysis.  RESPIRATORY:  She has been recently placed on antibiotics secondary to bronchitis.  She has had a recent cough secondary to bronchitis with some intermittent shortness of breath.  No hemoptysis. CARDIOVASCULAR:  No chest pain, angina, or orthopnea.  GASTROINTESTINAL:  No nausea, vomiting, diarrhea, constipation.  No blood or mucous in the stool. GENITOURINARY:  She does have some urgency and some frequency, with a history of urinary incontinence.  She is currently on Detrol.  She states she has improved since the initiation of this medication.  No dysuria or hematuria. MUSCULOSKELETAL:  Pertinent to that of the wrist found in the history of present illness.  PHYSICAL EXAMINATION:  VITAL SIGNS:  Pulse 88, respirations 18, blood pressure 150/88.  GENERAL:  [age]-year-old white female, overweight/obese.  Alert, oriented, and cooperative.  Very pleasant at the time of exam.  In no acute distress.  HEENT:  Normocephalic, atraumatic.  Pupils are round and reactive.  EOMs are intact.  NECK:  Supple.  No carotid bruits.  CHEST:  Some fine crackles noted in the bases bilaterally.  There is no rhonchi, rales, and no appreciated wheezing on exam.  HEART:  Regular rate and rhythm.  Distant heart sounds are noted.   S1, S2 noted.  ABDOMEN:  Faint bowel sounds but present.  Nontender.  Large, round, protuberant abdomen.  RECTAL, GENITOURINARY:  Not done, not  pertinent to present illness.  EXTREMITIES:  Limited to that of the right upper extremity.  Right wrist is examined.  She has normal pulses on exam.  Good sensation throughout the right hand, and no signs of dystrophic changes.  She does have a volar carpal ganglion.  IMPRESSION:  1. Right wrist volar mass.  2. Pulmonary embolism x 2.  3. Factor V deficiency.  4. Reflux disease.  5. Anemia.  6. Urinary incontinence.  7. Asthma.  8. Recent bout of bronchitis.  9. Varicose veins. 10. Degenerative disk disease. 11. History of bursitis. 12. History of sleep apnea. 13. History of arthritis.  PLAN:  The patient will be admitted to [facility] to undergo removal of the right wrist mass.  She will be admitted on November 28, 2000. Dr. Eula Listen will be consulted to assist with her anticoagulation of Lovenox until which time her protime comes down after being on Coumadin.  Her Coumadin will be stopped on Thursday.  She will be admitted on Friday and undergo coverage for Lovenox.  Once her protimes are within normal limits, the Lovenox will be stopped, and she will undergo removal of the right mass, tentatively planned for Tuesday, December 02, 2000. Dictated by:   Alexzandrew L. Perkins, P.A.-C. Attending:  Elisha Ponder, M.D. DD:  11/26/00 TD:  11/27/00 Job: [id number]

## 2010-06-08 NOTE — H&P (Signed)
[place]  Patient:    Rhonda, Acevedo                        MRN: [id number] Dictator:   Demetria Pore. Coral Spikes, M.D. CC:         Demetria Pore. Coral Spikes, M.D.             Lum Babe, M.D.                         History and Physical  CHIEF COMPLAINT:  The patient is a 60 year old right handed white female with a  chief complaint of "under my right breast", admitted with nausea and right upper quadrant and right lower chest area pain, ? etiology, initially to rule out gallbladder disease, for further evaluation and treatment.  PROBLEMS: 1.  Right upper quadrant area pain and right lower chest pain, ? etiology;     rule out gallbladder, ? other. 2.  Three to four week history of nausea without abdominal pain.  HISTORY OF PRESENT ILLNESS:  The patient called February 21, 1999 and states called in Phenergan, which did not help.  The patient was seen in the office on February 23, 1999 by Bridgett and blood was done, ? results, and ultrasound was scheduled for February 26, 1999 of the gallbladder.  On February 24, 1999 at 8 p.m. she had onset of a dull constant right upper quadrant and right lower chest pain that she describes below her right breast that goes through to her back but not to her shoulder, 8/10.  Nothing makes it better or worse.  She took no medication.  After two hours she called and I directed her o the ER.  I saw the patient in the emergency room.  Temperature was 97.8, blood pressure 156/91, pulse 91, respiratory rate 18.  Oxygen saturation 100%.  She was still having right upper quadrant and right lower chest area pain.  It was not going o the shoulder and there was no left chest pain.  Four weeks ago she had vomited nce with diarrhea.  No shortness of breath.  She was tender in the right upper quadrant.  I elected to admit for further evaluation.  The patient has a history of factor V leiden with DVT and pulmonary  embolus. She is also obese, with a history of hypoventilation.  She has asthma.  She was not  wheezing.  The pain was not worse with a deep breath.  She had no calf pain.  ALLERGIES:  None.  MEDICATIONS: 1.  Coumadin 7.5 mg Sunday and Wednesday and 10 mg the other days. 2.  Pulmicort inhaler 200 mcg 2 puffs b.i.d. 3.  Volmax 8 mg 1 b.i.d. 4.  Theophylline ER 300 mg 1 t.i.d., but did not take her third dose this evening. 5.  Claritin 10 mg q.d. 6.  Astelin 2 nose sprays b.i.d. p.r.n. 7.  Iron sulfate 325 mg q.d. 8.  Albuterol inhaler or hand-held nebulizer.  PAST SURGICAL HISTORY:  Gravida 3, para 2, one abortion.  PAST MEDICAL HISTORY: 1.  Factor V leiden deficiency. 2.  In 1993 right and left DVT treated with Coumadin and stopped. 3.  In 1995 right and left DVT and pulmonary embolus, on chronic Coumadin since     that time. 4.  Asthma. 5.  Obesity with hypoventilation, followed by Dr. Maple Hudson. 6.  November 2000, hospitalized with chest pain of unknown origin.  Lung scan     negative and Doppler of legs negative. 7.  History of iron deficiency anemia from menses. 8.  Allergy to cat, perfume, and fumes can make her asthma worse. 9.  History of sleep apnea, although she said on retesting did not have that.     Uses a cane because of disk disease in her back as well as knee and hips feel     like they give out.  Sees Dr. Charlett Blake and a chiropractor. 10. Question of right carpal tunnel syndrome in the past, asymptomatic now.  FAMILY/SOCIAL HISTORY:  Married, two children.  Does not smoke, does not drink alcohol.  On social security disability.  Family history of DVT, heart disease,  coronary artery disease, diabetes, cancer of the larynx, brain aneurysm, and colon cancer in brother.  REVIEW OF SYSTEMS:  Last normal menstrual period three weeks ago.  All other systems negative.  PHYSICAL EXAMINATION:  GENERAL:  Alert, no acute distress.  VITAL SIGNS:  Temperature 97.8  degrees, blood pressure 156/91, pulse 91, respiratory rate 18.  SKIN:  No rash, not sweaty.  HEENT:  Eyes:  Conjunctiva pink.  Sclerae white.  PERRL.  Fundi normal.  ENT unremarkable.  NECK:  Supple.  Thyroid not enlarged.  No bruit, no adenopathy.  BREAST:  Large and pendulous without gross mass.  Chaperoned by nurse.  LUNGS:  Clear to auscultation and percussion.  No CVA tenderness.  HEART:  Regular rhythm without murmurs, rubs, or gallops.  ABDOMEN:  Markedly obese.  Mildly tender to palpation in right upper quadrant, reproducing her pain.  No pain on palpation of the right lower chest wall. Bowel sounds normoactive.  No organomegaly.  PELVIC:  Bimanual pelvic exam:  I could move cervix, no pain, no gross adnexal tenderness.  RECTAL:  Confirmatory.  Stool brown and negative for blood.  EXTREMITIES:  No edema. Pulses intact.  Negative Homan sign.  Joint:  No synovitis.  NEUROLOGIC:  No gross focal neurological findings.  DATA BASE:  CBC, CMET, amylase, lipase, theophylline level, EKG, ultrasound of abdomen, PT and PTT pending.  IMPRESSION: 1.  Nausea and right upper quadrant pain, ? etiology; rule out acute cholecystitis,     rule out pancreas or GI problem; doubt cardiac or pulmonary; ? other.  PLAN: 1.  Admit.  Discuss with patient and husband. 2.  Nothing by mouth. 3.  Await above laboratory and ultrasound of gallbladder. 4.  Asthma.  Hand-held nebulizer.  Hold Volmax.  IV theophylline. 5.  Factor V leiden.  Hold Coumadin and give IV heparin. 6.  See order sheet for details.DD:  02/25/99 TD:  02/25/99 Job: [id number]

## 2010-06-08 NOTE — Op Note (Signed)
NAMEISA, HITZ NO.:  [id number]   MEDICAL RECORD NO.:  [id number]          PATIENT TYPE:  AMB   LOCATION:  ENDO                         FACILITY:  [facility]   PHYSICIAN:  Danise Edge, M.D.   DATE OF BIRTH:  1950/07/19   DATE OF PROCEDURE:  05/15/2004  DATE OF DISCHARGE:                                 OPERATIVE REPORT   PROCEDURE:  Colonoscopy and polypectomy.   INDICATIONS:  Ms. Rhonda Acevedo is a 60 year old female, born October 28, 1950.  Rhonda Acevedo takes Coumadin chronically to prevent recurrent pulmonary  emboli.  Her brother was diagnosed with colon cancer at age 29.  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  She is due for  repeat surveillance colonoscopy with polypectomy to prevent colon cancer.   ENDOSCOPIST:  Dr. Reece Agar   PREMEDICATION:  1.  Versed 10 mg.  2.  Demerol 50 mg.   PROCEDURE:  After obtaining informed consent, Rhonda Acevedo was placed in the  left lateral decubitus position.  I administered intravenous Demerol and  intravenous Versed to achieve conscious sedation for the procedure.  The  patient's blood pressure, oxygen saturation, and cardiac rhythm were  monitored throughout the procedure and documented in the medical record.   Anal inspection and digital rectal exam were normal.  The Olympus adjustable  pediatric colonoscope was introduced into the rectum and advanced to the  cecum.  Colonic preparation for the exam today was excellent.   Rectum normal, retroflex view of the distal rectum normal.  Sigmoid colon and descending colon.  At 60 cm from the anal verge, a 1-mm  sessile polyp was removed with the cold biopsy forceps.  At 40 cm from the  anal verge, a 2-mm sessile polyp was removed with electrocautery snare.  Splenic flexure normal.  Transverse colon normal.  Hepatic flexure normal.  Ascending colon normal.  Cecum and ileocecal valve normal.   ASSESSMENT:  A diminutive polyp was removed at 60 cm from the anal verge,  and a small polyp was removed at 40 cm from the anal verge.   RECOMMENDATIONS:  1.  Repeat colonoscopy in 5 years.  2.  Restart Coumadin in 5 days.      MJ/MEDQ  D:  05/15/2004  T:  05/15/2004  Job:  [id number]   cc:   Georgann Housekeeper, MD  [address]  [place]  [place] [address]  Fax: [phone]

## 2010-06-08 NOTE — H&P (Signed)
[place]  Patient:    Rhonda Acevedo                       MRN: [id number] Adm. Date:  [id number] Attending:  Pearla Dubonnet CC:         Clinton D. Maple Hudson, M.D.             Lum Babe, M.D.                         History and Physical  CHIEF COMPLAINT:  Dyspnea on exertion and transient hypoxia noted today.  HISTORY OF PRESENT ILLNESS:  Rhonda Acevedo is a 60 year old female with a history of morbid obesity, restricted airway disease, asthma, and heterozygous Factor V Leiden defect which has predisposed her to previous bilateral lower extremity deep venous thromboses and a pulmonary embolus.  She is on chronic anticoagulation therapy.  Two days prior to admission, the patient developed sudden shortness of breath with right pleuritic chest pain.  She did not notice any wheezing at the time but has noticed dyspnea on exertion.  She felt better as the day progressed and actually on the day prior to admission felt fine.  Today she suddenly developed shortness of breath with mid chest pleuritic chest pressure and pain with, again, dyspnea on  exertion.  She denies any leg pain, fever, chills, cough, or productive phlegm.  She just felt short of breath with exertion.  She noted no obvious wheezing. She had no diaphoresis and no throat or arm pain.  She was seen at ______ internal medicine at [facility] today with an initial pulse oximetry of 84% which rose to 93% while in the office.  She still had dyspnea on exertion and pleuritic chest  pain, however.  She was transferred over to [facility] where a VQ scan has shown low probability for PE, but she did have minimal air trapping.  She still  felt dyspneic and, on lung exam, had end expiratory wheezes.  O2 saturation 98% on room air.  The patient is admitted now for further workup.  PAST MEDICAL HISTORY: 1. Multiple environmental allergies - cats, perfumes,  colognes, and fumes from    cleaning fluids. 2. Iron-deficiency anemia secondary to heavy menses. 3. Sleep apnea in the past, but apparently this has cleared. 4. Otherwise per HPI as above.  Dr. Fannie Knee follows for problems with her lung    disease.  PAST SURGICAL HISTORY:  Noncontributory.  GYNECOLOGICAL HISTORY:  Gravida 3, para 2, spontaneous abortion x 1.  FAMILY HISTORY:  Significant for hypertension and MI in her father who died at ge 28 of an MI.  Mother died at age 102 of cancer of the larynx.  One brother died f brain aneurysm, and she has six brothers and sisters.  SOCIAL HISTORY:  The patient is married and disabled.  Her husband is disabled s well secondary to back pain.  No tobacco use, no alcohol.  REVIEW OF SYSTEMS:  Noncontributory except as above.  The patient has had mild eg edema chronically ever since her deep venous thromboses in the lower extremities. This has been worse currently.  PHYSICAL EXAMINATION:  GENERAL:  An obese female complaining of feeling dyspneic with exertion but sitting up and comfortable in the stretcher in the emergency room.  VITAL SIGNS:  Blood pressure 164/79, pulse 100 and regular, respiratory rate 20 and easy.  The patient  is afebrile.  O2 saturation 98% on room air.  HEENT:  Atraumatic, normocephalic.  It is a benign exam.  NECK:  Supple but obese.  CHEST:  Diffuse end expiratory wheezes noted.  CARDIAC:  Regular rate and rhythm without murmur.  ABDOMEN:  Soft, nontender.  Normal bowel sounds.  EXTREMITIES:  With 1+ edema to the mid shin bilaterally.  This is chronic.  NEUROLOGIC:  Nonfocal.  SKIN:  Without rashes.  LABORATORY DATA:  White count 10,600, hemoglobin 12.8, platelets 366,000, 77 polys, 18 lymphocytes, no eosinophils.  Pro time 20.4 seconds, INR 2.4, PTT 37 seconds. Albumin 3.4.  Electrolytes normal, BUN 11, creatinine 0.8, blood sugar 105. LFTs normal.  Urinalysis moderate blood with 0 to 5 red  cells, 6 to 10 white cells, ew bacteria, and many squamous cells.  Room air ABG is pending.  Iron, iron binding capacity, and theophylline levels re pending.  Chest x-ray is stable except for mild cardiomegaly.  CT scan reveals minimal air trapping but no unmatched defects and no significant change from prior VQ scans which were negative for PE.  EKG reveals sinus rhythm at 99 beats per minute.  It is a normal EKG.  ASSESSMENT:  A 60 year old female with probable asthmatic exacerbation on top of chronic obstructive lung disease.  She is not significantly anemic.  There is no evidence of congestive heart failure.  Pulmonary embolus is ruled out, and her ro time is excellent.  Her pro time is perfectly prolonged for Factor V Leiden for  history of deep venous thrombosis and pulmonary embolus.  PLAN: 1. IV Solu-Medrol, Humibid, albuterol nebulizers. 2. Check iron levels and theophylline level. 3. Continue other outpatient medications. 4. Hope for early discharge and symptoms resolution in the next 12 to 24 hours. DD:  12/04/98 TD:  12/04/98 Job: [id number]

## 2010-06-08 NOTE — Discharge Summary (Signed)
[facility]  Patient:    Rhonda Acevedo, Rhonda Acevedo [facility] Visit Number: [id number] MRN: [id number]          Service Type: SUR Location: 4W [id number] Attending Physician:  Dominica Severin Dictated by:   Ellwood Dense, P.A.-C. Admit Date:  11/28/2000 Discharge Date: 12/03/2000                             Discharge Summary  ADMITTING DIAGNOSES: 1.  Right wrist volar mass. 2.  Pulmonary embolism x2. 3.  Factor V deficiency. 4.  Reflux disease. 5.  Anemia. 6.  Urinary incontinence. 7.  Asthma. 8.  Recent bronchitis. 9.  Varicose veins. 10. Degenerative disk disease. 11. History of bursitis. 12. History of sleep apnea. 13. History of arthritis.  DISCHARGE DIAGNOSES: Right volar wrist mass, status post excisional biopsy, right volar wrist mass and radial artery exploration.  PROCEDURE PERFORMED:  The patient was taken to the OR on December 02, 2000, and underwent an excisional biopsy, right volar wrist mass, and a radial artery exploration.  SURGEON:  Elisha Ponder, M.D.  ASSISTANT:  Dorie Rank, P.A.-C.  ANESTHESIA:  Surgery done under a Bier block, followed by supplemental general anesthesia.  CONSULTS:  None.  BRIEF HISTORY:  The patient is a 60 year old female who has been seen and evaluated by Elisha Ponder, M.D., for right wrist pain.  She has been followed for pain ongoing for several months now since February of 2002.  She was found to have a volar carpal ganglion, which was previously scheduled to undergo excision; however, the patient had a significant history of pulmonary embolism x2 and also a factor V deficiency.  Specific arrangements had to be made with her physician, Georgann Housekeeper, M.D., due to the fact of limiting the amount of time which she was not anticoagulated. Arrangements were made, and the patient had to be admitted to [facility] for Coumadin and placed on heparin/Lovenox for coverage until the day of the procedure.   The patient was subsequently admitted into the hospital.  LABORATORY DATA:  CBC on admission:  hemoglobin 11.3, hematocrit 33.1, white count 6.1, red cell count 3.79.  Followup CBC on December 01, 2000, showed a hemoglobin of 11.4, hematocrit of 33.0, white cell count 6.9, red cell count 3.77.  PT and PTT on admission were elevated at 28.5 and 71, respectively. The INR was elevated at 3.7.  Serial protimes and INRs were followed.  The INR did come down by November 30, 2000.  It was down to 2.8 by the day of surgery, December 02, 2000.  Her protime was 14.7, and the INR was 1.2.  Followup PTINR after surgery on December 03, 2000, prior to discharge was 14.3 with an INR of 1.2.  Chemistry panel on admission: decreased albumin of 3.4, minimally elevated glucose of 119.  Remaining chemistry panel all within normal limits. Urinalysis on admission showed trace hemoglobin, trace leukocyte esterase, and only a few bacteria with 0-2 white cells.  EKG dated November 28, 2000, normal sinus rhythm with normal EKG.  Chest x-ray dated November 28, 2000:  no radiographic evidence of acute cardiopulmonary process.  HOSPITAL COURSE:  The patient was admitted to [facility] November 28, 2000.  The Coumadin was stopped, and she was started on Lovenox protocol per pharmacy.  She remained in the hospital, and her PTINR were followed daily. These may be found on the laboratory section of the chart.  It took several days for the INR to come back down since it very high at a level of 30.7.  On December 01, 2000, the PT was noted to be at 19.1 with an INR of 1.9.  It was felt that she would be able to go to the hospital the following day.  The Lovenox was discontinued that evening, and she was taken to the hospital the following day, on December 02, 2000.  The patient underwent the above procedure without difficulty.  She was transferred back up to the hospital floor where she was placed back on her  Coumadin.  She was also placed back on Lovenox protocol until therapeutic.  The patient was held until the following afternoon.  Later that day on December 03, 2000.  She was doing much better and had been weaned of her p.r.n. analgesics once arrangements had been made. She was discharged home.  DISCHARGE PLAN: 1. The patient was discharged home on December 03, 2000. 2. Discharge diagnoses as above.  Do not ______ 3. Discharge meds: She will resume her Coumadin therapy at home.  She    is also provided Lovenox for coverage, Percocet for pain, and Robaxin for    spasm. 4. Diet: Resume previous home diet.  Followup in 7-10 days with Dr. Amanda Pea.    Call for an appointment at [phone].  ACTIVITY: Elevate as needed for swelling.  Keep dressing clean and dry.  DISPOSITION:  To home. The patient is discharged improved. Dictated by:   Ellwood Dense, P.A.-C. Attending Physician:  Dominica Severin DD:  12/30/00 TD:  12/31/00 Job: [id number]

## 2010-06-08 NOTE — Op Note (Signed)
[facility]  Patient:    Rhonda Acevedo, Rhonda Acevedo [facility] Visit Number: [id number] MRN: [id number]          Service Type: SUR Location: 4W [id number] [id number] Attending Physician:  Dominica Severin Dictated by:   Elisha Ponder, M.D. Proc. Date: 12/02/00 Admit Date:  11/28/2000   CC:         Dorie Rank, P.A.   Operative Report  PREOPERATIVE DIAGNOSIS:  Right volar wrist mass.  POSTOPERATIVE DIAGNOSIS:  Right volar wrist mass.  PROCEDURE: 1. Excisional biopsy right volar wrist mass. 2. Radial artery exploration.  SURGEON:  Elisha Ponder, M.D.  ASSISTANT:  Dorie Rank, P.A.  ANESTHESIA:  Bier block followed by supplemental general anesthesia.  TOURNIQUET TIME:  Less than one hour.  ESTIMATED BLOOD LOSS:  Minimal.  COMPLICATIONS:  None.  DRAINS:  None.  INDICATIONS FOR PROCEDURE:  This patient is a very pleasant 60 year old female who presents with the above mentioned diagnosis.  She has had a very painful right volar wrist mass for quite some time.  She has tried all forms of conservative management and unfortunately still continues to have very significant pain.  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  I have discussed this with her at length.  Unfortunately, the volar wrist mass is quite painful and is not amenable to conservative measures.  Thus, we have elected to proceed with excisional biopsy at her request.  I have counseled her in regards to conservative and surgical management, alternatives of surgery etc.  With this in mind, she has now asked to proceed.  She was admitted to the hospital to allow her coagulation to drift down into normal range while on Lovenox.  She will be placed back on Coumadin and Lovenox protocol immediately postoperatively.  I have discussed this with Dr. Eula Listen.  All questions have been encouraged and answered including risks of infection, bleeding, anesthesia, damage to normal structures, failure of surgery  to accomplish attended goals or relieving symptoms and restoring function and recurrent PE, DVT etc. given her history.  OPERATIVE FINDINGS:  The patient had a volar wrist mass right radial carpal joint.  This was excised and sent for specimen.  The radial artery was dissected and freed of adhesive material around it.  There were no complications.  OPERATION:  The patient was taken to the operative suite.  She underwent Bier block anesthesia and following this was placed supine.  The Bier block anesthesia was somewhat incomplete.  Thus, I placed some supplemental local anesthesia but the discomfort necessitated a general LMA anesthetic.  This was placed under the direction of Dr. Lucille Passy.  Once adequate anesthesia was obtained, the patient then had the operation commenced.  I should note that the patient was appropriately padded, prepped and draped in the usual sterile fashion and carefully transported prior to the induction of general anesthesia.  She was prepped and draped with ten minute Betadine scrub followed by Betadine painting and draping.  Once the general anesthesia was induced, the operation commenced and the incision was made along the radial aspect of the volar wrist.  This was carried down through the skin.  Following this, the subcutaneous tissues was identified and dissected.  The radial artery was identified, mobilized and teased away gently under 4.0 loupe magnification from the volar wrist mass. The patient tolerated this well.  Following this, the patient underwent circumferential dissection of the volar wrist mass.  It originated from a stalk in location about the radial  carpal joint.  This stalk and of course the capsule was excised, cauterized with bipolar electrocautery to prevent recurrence and was sent for specimen.  This was consistent with a ganglion type mass.  This was sent for specimen.  The patient tolerated this well.  Following this, the  radial artery was further explored and noted to be intact. The patient then underwent tourniquet deflation.  Hemostasis was obtained with bipolar electrocautery.  Copious irrigation was applied and the wound was closed with interrupted Nylon suture.  She tolerated the procedure well without difficulty.  A short arm plaster splint was applied.  She was awakened from anesthesia and was stable, awake, alert and oriented in the recovery room and neurovascularly intact.  Will monitor condition closely and proceed accordingly.  I have discussed all issues and all questions have been encouraged and answered.  The patient will be immediately placed upon Coumadin as well as the Lovenox protocol per pharmacy to get her coagulation parameters back out.  I have discussed with the family all issues and all plans. Dictated by:   Elisha Ponder, M.D. Attending Physician:  Dominica Severin DD:  12/02/00 TD:  12/02/00 Job: [id number]

## 2010-06-08 NOTE — Op Note (Signed)
Rhonda Acevedo, Rhonda Acevedo               ACCOUNT NO.:  [id number]   MEDICAL RECORD NO.:  [id number]          PATIENT TYPE:  OIB   LOCATION:  5012                         FACILITY:  [facility]   PHYSICIAN:  Harvie Junior, M.D.   DATE OF BIRTH:  01-Aug-1950   DATE OF PROCEDURE:  04/12/2005  DATE OF DISCHARGE:                                 OPERATIVE REPORT   PREOPERATIVE DIAGNOSIS:  Impingement.   POSTOPERATIVE DIAGNOSES:  1.  Impingement.  2.  Superior labral tear, anterior to posterior.  3.  Rotator cuff tear, supraspinatus.   OPERATION PERFORMED:  1.  Arthroscopic acromioplasty.  2.  Arthroscopic debridement of superior labral tear anterior to posterior      with release of biceps tendon and debridement of undersurface rotator      cuff tear.  3.  Miniopen rotator cuff repair.  4.  Biceps tenodesis open.   SURGEON:  Harvie Junior, M.D.   ASSISTANT:  Marshia Ly, P.A.   ANESTHESIA:  General.   INDICATIONS FOR PROCEDURE:  Rhonda Acevedo is a 60 year old female with a long  history of having significant shoulder pain.  We treated her conservatively  in the office but continued complaints of pain were such that we underwent  an injection of the shoulder that helped wonderfully for a period of time  and then ultimately, she began having increased complaints of pain and  because of this and failure of conservative care she was taken to the  operating room for subacromial decompression, distal clavicle resection.   DESCRIPTION OF PROCEDURE:  The patient was taken to the operating room and  after adequate anesthesia was obtained with general anesthetic, patient was  placed on the operating table.  The left shoulder was then prepped and  draped in the usual sterile fashion.  Following this, routine arthroscopic  examination of the shoulder revealed that there was no significant  glenohumeral arthritis. There was some significant fray of the undersurface  of the rotator cuff and the  supraspinatus and with debridement it was clear  this is a full thickness area.  The biceps tendon was loose at the superior  labrum.  The superior labrum was beat up anterior to posterior.  At this  point it was felt that the most appropriate course of action was going to be  debridement of superior labrum with release of biceps tendon and because we  knew we were going to need to do a rotator cuff repair, we felt that open  biceps tenodesis was going to be the most appropriate course of action.   At this point tendon was turned up into the subacromial space from the  glenohumeral joint and then an anterolateral acromioplasty was performed.  There were a couple of inferior spurs on the clavicle which were debrided  but a distal clavicle resection was not undertaken.  At this time the  arthroscopic portion of the case was abandoned after the thinned area of the  rotator cuff was identified from the superior surface.  At this point the  incision was extended on the lateral side in a  generous nature because of  the patient 's size, subcutaneous dissection down to the level of the  deltoid.  The deltoid muscle was split in line with its fibers up onto the  bone.  Acromioplasty was further performed, followed by locating the rotator  cuff tear and a miniopen rotator cuff repair was achieved with a 5.5 mm  Arthrex anchor with two #2 FiberWire stitches.  The biceps tenodesis was  then undertaken by taking the biceps tendon in its groove, holding the arm  into full extension and then locating the appropriate location for the  biceps tendon.  It was tenodesed in position. The bone was roughened in the  bicipital groove prior to this.  The stitches from the biceps tendon were  further oversewed to the rotator cuff.  At this point the wound was  copiously irrigated and suctioned dry.  A gloved finger was placed on top of  the humeral head and the arm was put through a range of motion.  No tendency   toward impingement.  At this point the wound was copiously irrigated and  suctioned dry.  The deltoid was closed with 1 Vicryl running suture, the  skin with 0 and 2-0 Vicryl and skin staples.  Sterile compressive dressing  was applied.  The patient was then transferred to the recovery room where  she was noted to be in satisfactory condition.  The estimated blood loss was  none.      Harvie Junior, M.D.  Electronically Signed     JLG/MEDQ  D:  04/12/2005  T:  04/15/2005  Job:  [id number]

## 2010-06-08 NOTE — Discharge Summary (Signed)
[place]  Patient:    Rhonda Acevedo, Rhonda Acevedo                      MRN: [id number] Adm. Date:  [id number] Disc. Date: [id number] Attending:  Lum Babe CC:         Jimmye Norman, M.D.                           Discharge Summary  HISTORY OF PRESENT ILLNESS:  [age]-year-old white female, heterozygous for factor V Leiden, was admitted with acute cholecystitis.  Anticoagulation with Coumadin was discontinued, and she was placed on heparin.  On February 28, 1999, she was taken to the operating room for a laparoscopic cholecystectomy.  For the remaining time in the hospital, anticoagulation was reinstituted.  She was discharged on March 07, 1999.  LABORATORY DATA:  Hemoglobin 11.3 grams percent, hematocrit 33.8%, white count 800 on admission, 80 polys, 15 lymphs, 3 monocytes, 1 eosinophil, no basophils, 1 large unidentified cell.  Hemoglobin drifted down to a low of 9.8, with last recorded  value of 10.3.  INR on admission was 3.5.  After vitamin K, her INR came down to 1.3, and she underwent surgery.  Because of the vitamin K, it took time to rebuild her anticoagulation, but on discharge her INR was 2.2.  Sodium 135, potassium 3.6, chloride 103, CO2 25, sugar 132.  Albumin was slightly low at 3.2.  Other values were normal.  Theophylline levels ranged from 7.8, which was slightly subtherapeutic, to 12.0, which was therapeutic.  Electrocardiogram:  Sinus rhythm, possibly low voltage.  Chest x-ray showed cardiomegaly with question of hiatal hernia.  No definite active lung findings.  HOSPITAL COURSE:  The patient was given vitamin K and placed on heparin, as Coumadin was withdrawn.  For her asthma, she was maintained on aminophylline, initially given intravenously, and hand-held nebulization.  Following surgery, he was kept on heparin for awhile, then anticoagulation came into range with Coumadin, and heparin was discontinued.  Pulmonary  toilet was continued.  As her Coumadin  therapy came into range, the patient was discharged.  DISCHARGE DIAGNOSES: 1. Acute cholecystitis. 2. Factor V Leiden heterozygous state. 3. Asthma. 4. Exogenous obesity.  DISCHARGE MEDICATIONS: 1. Coumadin 10 mg daily, with 7.5 mg on Wednesdays and Sundays. 2. Pulmicort 2 puffs twice a day. 3. Proventil hand-held nebulizer as needed. 4. Albuterol metered dose inhaler as needed. 5. Astelin 2 sprays each nostril b.i.d. 6. Flomax 8 mg 1 twice a day. 7. Theophylline 300 mg 1 three times a day. 8. Claritin 10 mg 1 a day. 9. Ferrous sulfate 1 a day.  ACTIVITY:  As tolerated.  DIET:  Weight reduction.  WOUND CARE:  Not applicable.  SPECIAL INSTRUCTIONS:  None.  FOLLOW-UP:  With Dr. Lindie Spruce is arranged.  Prothrombin time two weeks in our office.  CONDITION ON DISCHARGE:  Improved. DD:  03/30/99 TD:  03/31/99 Job: [id number]

## 2010-06-08 NOTE — Consult Note (Signed)
[place]  Patient:    Rhonda Acevedo                       MRN: [id number] Proc. Date: 02/25/99 Adm. Date:  [id number] Attending:  Lum Babe CC:         Lum Babe, M.D., Cardiology             Dr. Valentina Lucks                          Consultation Report  Dear Drs. Demetrius Revel and [place],  Thank you very much for asking me to see Ms. Skluzacek, a 60 year old massively obese woman with acute cholecystitis and symptomatic cholelithiasis.  She was admitted today with right upper quadrant pain, nausea and vomiting. She had an ultrasound, which showed a gallbladder full of stones and a thickened wall. A surgical consultation was obtained.  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  She is on multiple respiratory medications, including Pulmacort, Serevent.  I am not sure if she is on steroids.  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  On examination, she is mildly to moderately tender in the right upper quadrant ith rebound and guarding, but she has bowel sounds.  She currently has no appetite.   Her white count is normal.  She does not have a left shift and it seems very likely that the patient has symptomatic cholelithiasis, possibly biliary or colic, possible acute cholecystitis that will require antibiotics and an INR getting back into the normal range.  Currently, we are allowing her PT to drift down and we ill place her on heparin once her INR gets less than 2.5. DD:  02/25/99 TD:  02/25/99 Job: [id number]

## 2010-06-08 NOTE — Procedures (Signed)
[facility]  Patient:    Rhonda Acevedo, Rhonda Acevedo Visit Number: [id number] MRN: [id number]          Service Type: Attending:  Verlin Grills, M.D. Dictated by:   Verlin Grills, M.D. Proc. Date: 04/15/01   CC:         Tyson Dense, M.D.   Procedure Report  PROCEDURE:  Colonoscopy and polypectomy.  REFERRING PHYSICIAN:  Tyson Dense, M.D.  PROCEDURE INDICATION:  Rhonda Acevedo is a 60 year old female born May 22, 1950. Rhonda Acevedo is referred for diagnostic colonoscopy to evaluate guaiac positive stool. Her younger brother was recently diagnosed with colon cancer.  I discussed with Rhonda Acevedo the complications associated with colonoscopy and polypectomy including a 15 per 1000 risk of bleeding and 4 per 1000 risk of colon perforation requiring surgical repair. Rhonda Acevedo has signed the operative permit.  Rhonda Acevedo chronically takes Coumadin to prevent recurrent pulmonary emboli. Five days prior to todays colonoscopy she stopped Coumadin and started Lovenox. She received no anticoagulation therapy yesterday or the morning prior to her colonoscopy.  ENDOSCOPIST:  Verlin Grills, M.D.  PREMEDICATION:  Versed 10 mg, Demerol 100 mg.  ENDOSCOPE:  Olympus Pediatric colonoscope.  DESCRIPTION OF PROCEDURE:  After obtaining informed consent, Rhonda Acevedo was placed in the left lateral decubitus position. I administered intravenous Demerol and intravenous Versed to achieve conscious sedation for the procedure. The patients blood pressure, oxygen saturation and cardiac rhythm were monitored throughout the procedure and documented in the medical record.  Anal inspection was normal. Digital rectal exam was normal. The Olympus Pediatric video colonoscope was introduced into the rectum and easily advanced to the cecum. Colonic preparation for the exam today was excellent.  Rectum:  Normal.  Sigmoid colon and descending colon:  At 35  cm from the anal verge, a 2-cm pedunculated polyp was discovered. The stalk was Endoclipped with two Endoclips. The electrocautery snare was used to remove the polyp leaving the Endoclipped stalk in place. The polyp was sent for pathological evaluation. There was no bleeding postpolypectomy from the Endoclipped stalk.  Splenic flexure:  Normal.  Transverse colon:  Normal.  Hepatic flexure:  Normal.  Ascending colon:  Normal.  Cecum and ileocecal valve:  Normal.  ASSESSMENT:  At 35 cm from the anal verge, from the sigmoid colon, a 2-cm pedunculated polyp was removed with the electrocautery snare. The remaining stalk was Endoclipped with two clips to prevent bleeding.  RECOMMENDATIONS:  Rhonda Acevedo will resume taking her Lovenox this evening and resume her usual dose of Coumadin this evening. I will not give her a loading dose of Coumadin. I will ask her to return to my office in approximately five days to determine what her prothrombin time is and determine when she can go off the Lovenox. Dictated by:   Verlin Grills, M.D. Attending:  Verlin Grills, M.D. DD:  04/15/01 TD:  04/16/01 Job: [id number]

## 2010-10-24 LAB — BASIC METABOLIC PANEL
CO2: 26
Calcium: 9.2
Chloride: 106
Creatinine, Ser: 0.92
GFR calc Af Amer: >60
Glucose, Bld: 97

## 2010-10-24 LAB — DIFFERENTIAL
Basophils Absolute: 0.1
Basophils Relative: 1
Eosinophils Absolute: 0
Monocytes Absolute: 0.6
Monocytes Relative: 7

## 2010-10-24 LAB — POCT I-STAT, CHEM 8
BUN: 22
Calcium, Ion: 1.18
Creatinine, Ser: 1.1
Glucose, Bld: 114 — ABNORMAL HIGH
TCO2: 25

## 2010-10-24 LAB — PROTIME-INR: Prothrombin Time: 12.8

## 2010-10-24 LAB — CBC
Hemoglobin: 12.1
Hemoglobin: 12.1
MCHC: 32.5
MCHC: 33.1
MCV: 91.4
MCV: 89.2
RBC: 4.06
RBC: 4.1
RDW: 14.7
RDW: 14.2

## 2010-11-01 LAB — DIFFERENTIAL
Basophils Absolute: 0
Basophils Relative: 0
Monocytes Absolute: 0.6
Neutro Abs: 3.6

## 2010-11-01 LAB — CBC
Hemoglobin: 11.3 — ABNORMAL LOW
MCHC: 33.4
Platelets: 247
RDW: 13.4

## 2010-11-01 LAB — BASIC METABOLIC PANEL
BUN: 9
CO2: 30
Calcium: 8.7
Creatinine, Ser: 0.85
Glucose, Bld: 99
Sodium: 138

## 2011-02-28 DIAGNOSIS — IMO0001 Reserved for inherently not codable concepts without codable children: Secondary | ICD-10-CM | POA: Insufficient documentation

## 2011-02-28 DIAGNOSIS — D6851 Activated protein C resistance: Secondary | ICD-10-CM | POA: Insufficient documentation

## 2011-02-28 DIAGNOSIS — K432 Incisional hernia without obstruction or gangrene: Secondary | ICD-10-CM | POA: Insufficient documentation

## 2011-02-28 DIAGNOSIS — K635 Polyp of colon: Secondary | ICD-10-CM | POA: Insufficient documentation

## 2011-02-28 DIAGNOSIS — M539 Dorsopathy, unspecified: Secondary | ICD-10-CM | POA: Insufficient documentation

## 2011-03-26 ENCOUNTER — Encounter: Payer: Self-pay | Admitting: Internal Medicine

## 2011-03-27 ENCOUNTER — Encounter: Payer: Self-pay | Admitting: Internal Medicine

## 2011-03-27 ENCOUNTER — Ambulatory Visit (INDEPENDENT_AMBULATORY_CARE_PROVIDER_SITE_OTHER): Payer: Medicare Other | Admitting: Internal Medicine

## 2011-03-27 VITALS — BP 120/80 | HR 76 | Ht 68.0 in | Wt 361.0 lb

## 2011-03-27 DIAGNOSIS — J9611 Chronic respiratory failure with hypoxia: Secondary | ICD-10-CM

## 2011-03-27 DIAGNOSIS — G4730 Sleep apnea, unspecified: Secondary | ICD-10-CM

## 2011-03-27 DIAGNOSIS — J40 Bronchitis, not specified as acute or chronic: Secondary | ICD-10-CM

## 2011-03-27 DIAGNOSIS — E678 Other specified hyperalimentation: Secondary | ICD-10-CM

## 2011-03-27 DIAGNOSIS — R0902 Hypoxemia: Secondary | ICD-10-CM

## 2011-03-27 DIAGNOSIS — J9610 Chronic respiratory failure, unspecified whether with hypoxia or hypercapnia: Secondary | ICD-10-CM

## 2011-03-27 NOTE — Progress Notes (Signed)
74 yoF followed for OSA, OHS, morbid obesity, complicated by hx DVT, asthma/ bronchitis, GERD LOV- 03/27/10   daughter and granddaughter here.   PCP Dr. Eula Listen She continues oxygen 3 L per minute continuously/[name]. CPAP 7 CWP is used all night every night without a humidifier by her preference. She feels that she is breathing "through a fog". Hoarseness comes and goes with any strong odor. Still using Advair, nebulizer up to 4 times daily and rescue inhaler less than every day. Recognizes some reflux. Last PFT was 05/05/2007.  ROS-see HPI Constitutional:   No-   weight loss, night sweats, fevers, chills, fatigue, lassitude. HEENT:   No-  headaches, difficulty swallowing, tooth/dental problems, sore throat,       No-  sneezing, itching, ear ache, nasal congestion, post nasal drip,  CV:  No-   chest pain, orthopnea, PND, swelling in lower extremities, anasarca,  dizziness, palpitations Resp: No- acute  shortness of breath with exertion or at rest.              No-   productive cough,  No non-productive cough,  No- coughing up of blood.              No-   change in color of mucus.  No- wheezing.   Skin: No-   rash or lesions. GI:  No-   heartburn, indigestion, abdominal pain, nausea, vomiting,  GU: No-   dysuria, . MS:  No-   joint pain or swelling.   Neuro-     nothing unusual Psych:  No- change in mood or affect. No depression or anxiety.  No memory loss.  OBJ- Physical Exam General- Alert, Oriented, Affect-appropriate, Distress- none acute. Morbid obesity Skin- rash-none, lesions- none, excoriation- none Lymphadenopathy- none Head- atraumatic            Eyes- Gross vision intact, PERRLA, conjunctivae and secretions clear            Ears- Hearing, canals-normal            Nose- Clear, no-Septal dev, mucus, polyps, erosion, perforation             Throat- Mallampati II-III , mucosa clear , drainage- none, tonsils- atrophic. Mild hoarseness, throat clearing Neck- flexible , trachea  midline, no stridor , thyroid nl, carotid no bruit Chest - symmetrical excursion , unlabored           Heart/CV- RRR , no murmur , no gallop  , no rub, nl s1 s2                           - JVD- none , edema- none, stasis changes- none, varices- none           Lung- clear to P&A, wheeze- none, cough- none , dullness-none, rub- none           Chest wall-  Abd- Br/ Gen/ Rectal- Not done, not indicated Extrem- cyanosis- none, clubbing, none, atrophy- none, strength- nl Neuro- grossly intact to observation

## 2011-03-27 NOTE — Patient Instructions (Signed)
Order- schedule PFT   Dx asthma with bronchitis  Given samples- Benicar 20 mg- take one daily instead of lisinopril. Watch over the month to see if you notice that dry cough and hoarseness are better.

## 2011-03-30 ENCOUNTER — Encounter: Payer: Self-pay | Admitting: Internal Medicine

## 2011-03-30 NOTE — Assessment & Plan Note (Signed)
Good compliance and control using CPAP 7/O2 3 L/[name]

## 2011-03-30 NOTE — Assessment & Plan Note (Signed)
She has not been willing to make the lifestyle changes that would be necessary.

## 2011-03-30 NOTE — Assessment & Plan Note (Addendum)
Controlled. Weight loss would make a huge impact.

## 2011-03-30 NOTE — Assessment & Plan Note (Signed)
We are giving samples of Benicar 20 mg #21 to take for 3 weeks instead of lisinopril. CHB ACE inhibitor is contributing to her upper airway complaints and cough.. She is divorced but she is not wheezing. Potential additional factors include the steroid and dry powder of her Advair, and her chronic reflux. Schedule PFT.

## 2011-04-12 ENCOUNTER — Ambulatory Visit (INDEPENDENT_AMBULATORY_CARE_PROVIDER_SITE_OTHER): Payer: Medicare Other | Admitting: Internal Medicine

## 2011-04-12 DIAGNOSIS — J40 Bronchitis, not specified as acute or chronic: Secondary | ICD-10-CM

## 2011-04-12 NOTE — Progress Notes (Signed)
PFT done today. 

## 2011-05-09 ENCOUNTER — Encounter: Payer: Self-pay | Admitting: Internal Medicine

## 2011-05-09 ENCOUNTER — Ambulatory Visit (INDEPENDENT_AMBULATORY_CARE_PROVIDER_SITE_OTHER): Payer: Medicare Other | Admitting: Internal Medicine

## 2011-05-09 VITALS — BP 124/80 | HR 79 | Ht 68.0 in | Wt 341.2 lb

## 2011-05-09 DIAGNOSIS — J45909 Unspecified asthma, uncomplicated: Secondary | ICD-10-CM

## 2011-05-09 DIAGNOSIS — E678 Other specified hyperalimentation: Secondary | ICD-10-CM

## 2011-05-09 NOTE — Progress Notes (Signed)
68 yoF followed for OSA, OHS, morbid obesity, complicated by hx DVT, asthma/ bronchitis, GERD LOV- 03/27/10   daughter and granddaughter here.   PCP Dr. Eula Listen She continues oxygen 3 L per minute continuously/[name]. CPAP 7 CWP is used all night every night without a humidifier by her preference. She feels that she is breathing "through a fog". Hoarseness comes and goes with any strong odor. Still using Advair, nebulizer up to 4 times daily and rescue inhaler less than every day. Recognizes some reflux. Last PFT was 05/05/2007.  05/09/11- 60 yoF followed for OSA, OHS, morbid obesity, complicated by hx DVT, asthma/ bronchitis, GERD Breathing "is about the same" as when seen 03/27/11, pt states voice is improving. Pt had PFT 04/12/11- to be reviewed with pt.   We gave Benicar instead of her ACE inhibitor. With the change, she noticed less cough and hoarseness then with lisinopril. Some heartburn on OTC acid blocker. Using Pulmicort powder. Comfortable with CPAP used all might every night. PFT 04/12/2011-normal spirometry flows within significant response to bronchodilator. FEV1/FVC 0.79. Mild restriction and moderate diffusion reduction both may be do to her obesity.  ROS-see HPI Constitutional:   No-   weight loss, night sweats, fevers, chills, fatigue, lassitude. HEENT:   No-  headaches, difficulty swallowing, tooth/dental problems, sore throat,       No-  sneezing, itching, ear ache, nasal congestion, post nasal drip,  CV:  No-   chest pain, orthopnea, PND, swelling in lower extremities, anasarca,  dizziness, palpitations Resp: No- acute  shortness of breath with exertion or at rest.              No-   productive cough,  Little non-productive cough,  No- coughing up of blood.              No-   change in color of mucus.  No- wheezing.   Skin: No-   rash or lesions. GI:  No-   heartburn, indigestion, abdominal pain, nausea, vomiting,  GU: No-   dysuria, . MS:  No-   joint pain or swelling.     Neuro-     nothing unusual Psych:  No- change in mood or affect. No depression or anxiety.  No memory loss.  OBJ- Physical Exam General- Alert, Oriented, Affect-appropriate, Distress- none acute. Morbid obesity Skin- rash-none, lesions- none, excoriation- none Lymphadenopathy- none Head- atraumatic            Eyes- Gross vision intact, PERRLA, conjunctivae and secretions clear            Ears- Hearing, canals-normal            Nose- Clear, no-Septal dev, mucus, polyps, erosion, perforation             Throat- Mallampati II-III , mucosa clear , drainage- none, tonsils- atrophic. Mild hoarseness, throat clearing Neck- flexible , trachea midline, no stridor , thyroid nl, carotid no bruit Chest - symmetrical excursion , unlabored           Heart/CV- RRR , no murmur , no gallop  , no rub, nl s1 s2                           - JVD- none , edema- none, stasis changes- none, varices- none           Lung- clear to P&A, wheeze- none, cough- none , dullness-none, rub- none  Chest wall-  Abd- Br/ Gen/ Rectal- Not done, not indicated Extrem- cyanosis- none, clubbing, none, atrophy- none, strength- nl Neuro- grossly intact to observation

## 2011-05-09 NOTE — Patient Instructions (Signed)
Sample Dulera 100    2 puffs then rinse mouth, twice daily   Try this instead of Advair. Ok to go back to Advair, or call us for script for [employer]

## 2011-05-13 ENCOUNTER — Encounter: Payer: Self-pay | Admitting: Internal Medicine

## 2011-05-13 NOTE — Assessment & Plan Note (Addendum)
Question if we can find a steroid inhaler that does not aggravate hoarseness. Plan-switch to [facility] 100 to get away from dry powder. If problem persists, consider spacer.

## 2011-05-13 NOTE — Assessment & Plan Note (Signed)
Diagnosis is consistent with pulmonary function results. I emphasized weight loss.

## 2011-11-08 ENCOUNTER — Encounter: Payer: Self-pay | Admitting: Internal Medicine

## 2011-11-08 ENCOUNTER — Ambulatory Visit (INDEPENDENT_AMBULATORY_CARE_PROVIDER_SITE_OTHER): Payer: Medicare Other | Admitting: Internal Medicine

## 2011-11-08 VITALS — BP 118/78 | HR 75 | Ht 68.0 in | Wt 371.4 lb

## 2011-11-08 DIAGNOSIS — G4730 Sleep apnea, unspecified: Secondary | ICD-10-CM

## 2011-11-08 DIAGNOSIS — E678 Other specified hyperalimentation: Secondary | ICD-10-CM

## 2011-11-08 NOTE — Patient Instructions (Addendum)
Continue CPAP 7/ [name] Continue oxygen at 3 L/M  Please call as needed

## 2011-11-08 NOTE — Progress Notes (Signed)
59 yoF followed for OSA, OHS, morbid obesity, complicated by hx DVT, asthma/ bronchitis, GERD LOV- 03/27/10   daughter and granddaughter here.   PCP Dr. Eula Listen She continues oxygen 3 L per minute continuously/[name]. CPAP 7 CWP is used all night every night without a humidifier by her preference. She feels that she is breathing "through a fog". Hoarseness comes and goes with any strong odor. Still using Advair, nebulizer up to 4 times daily and rescue inhaler less than every day. Recognizes some reflux. Last PFT was 05/05/2007.  05/09/11- 60 yoF followed for OSA, OHS, morbid obesity, complicated by hx DVT, asthma/ bronchitis, GERD Breathing "is about the same" as when seen 03/27/11, pt states voice is improving. Pt had PFT 04/12/11- to be reviewed with pt.   We gave Benicar instead of her ACE inhibitor. With the change, she noticed less cough and hoarseness then with lisinopril. Some heartburn on OTC acid blocker. Using Pulmicort powder. Comfortable with CPAP used all might every night. PFT 04/12/2011-normal spirometry flows within significant response to bronchodilator. FEV1/FVC 0.79. Mild restriction and moderate diffusion reduction both may be do to her obesity.  11/08/11- 61 yoF followed for OSA, OHS, morbid obesity, complicated by hx DVT/ PE, asthma/ bronchitis, GERD SOB and wheezing with exertion; wears O2 all the time She is staying on oxygen at 3 L. CPAP 7/[name] plus oxygen at 3 L, good compliance and control with no concerns. She has not lost weight [phone]). Sample Dulera work during well but she preferred to stay on Advair, which she was used to. Dr Donette Larry manages her Coumadin.  ROS-see HPI Constitutional:   No-   weight loss, night sweats, fevers, chills, fatigue, lassitude. HEENT:   No-  headaches, difficulty swallowing, tooth/dental problems, sore throat,       No-  sneezing, itching, ear ache, nasal congestion, post nasal drip,  CV:  No-   chest pain, orthopnea, PND,  swelling in lower extremities, anasarca,  dizziness, palpitations Resp: No- acute  shortness of breath with exertion or at rest.              No-   productive cough,  Little non-productive cough,  No- coughing up of blood.              No-   change in color of mucus.  No- wheezing.   Skin: No-   rash or lesions. GI:  No-   heartburn, indigestion, abdominal pain, nausea, vomiting,  GU:  MS:  No-   joint pain or swelling.   Neuro-     nothing unusual Psych:  No- change in mood or affect. No depression or anxiety.  No memory loss.  OBJ- Physical Exam General- Alert, Oriented, Affect-appropriate, Distress- none acute. Morbid obesity. O2 3L Skin- rash-none, lesions- none, excoriation- none Lymphadenopathy- none Head- atraumatic            Eyes- Gross vision intact, PERRLA, conjunctivae and secretions clear            Ears- Hearing, canals-normal            Nose- Clear, no-Septal dev, mucus, polyps, erosion, perforation             Throat- Mallampati II-III , mucosa clear , drainage- none, tonsils- atrophic. Mild hoarseness, throat clearing Neck- flexible , trachea midline, no stridor , thyroid nl, carotid no bruit Chest - symmetrical excursion , unlabored           Heart/CV- RRR , no murmur , no  gallop  , no rub, nl s1 s2                           - JVD- none , edema- none, stasis changes- none, varices- none           Lung- clear to P&A, wheeze- none, cough- none , dullness-none, rub- none           Chest wall-  Abd- Br/ Gen/ Rectal- Not done, not indicated Extrem- cyanosis- none, clubbing, none, atrophy- none, strength- nl Neuro- grossly intact to observation

## 2011-11-17 NOTE — Assessment & Plan Note (Signed)
This problem will not have improved to 30 pound weight gain since last visit. I presented the issue. We have pointed out that bariatric surgery information is available if she wishes to learn about.

## 2011-11-17 NOTE — Assessment & Plan Note (Signed)
Good compliance and control without changes needed.

## 2012-03-05 ENCOUNTER — Emergency Department (HOSPITAL_COMMUNITY)
Admission: EM | Admit: 2012-03-05 | Discharge: 2012-03-06 | Disposition: A | Discharge status: Home / Self Care | Payer: Medicare Other | Attending: Emergency Medicine | Admitting: Emergency Medicine

## 2012-03-05 ENCOUNTER — Encounter (HOSPITAL_COMMUNITY): Payer: Self-pay

## 2012-03-05 DIAGNOSIS — Z7901 Long term (current) use of anticoagulants: Secondary | ICD-10-CM | POA: Insufficient documentation

## 2012-03-05 DIAGNOSIS — E785 Hyperlipidemia, unspecified: Secondary | ICD-10-CM | POA: Insufficient documentation

## 2012-03-05 DIAGNOSIS — I10 Essential (primary) hypertension: Secondary | ICD-10-CM | POA: Insufficient documentation

## 2012-03-05 DIAGNOSIS — K219 Gastro-esophageal reflux disease without esophagitis: Secondary | ICD-10-CM | POA: Insufficient documentation

## 2012-03-05 DIAGNOSIS — J45909 Unspecified asthma, uncomplicated: Secondary | ICD-10-CM | POA: Insufficient documentation

## 2012-03-05 DIAGNOSIS — Z86718 Personal history of other venous thrombosis and embolism: Secondary | ICD-10-CM | POA: Insufficient documentation

## 2012-03-05 DIAGNOSIS — G4730 Sleep apnea, unspecified: Secondary | ICD-10-CM | POA: Insufficient documentation

## 2012-03-05 DIAGNOSIS — Z79899 Other long term (current) drug therapy: Secondary | ICD-10-CM | POA: Insufficient documentation

## 2012-03-05 DIAGNOSIS — Z86711 Personal history of pulmonary embolism: Secondary | ICD-10-CM | POA: Insufficient documentation

## 2012-03-05 DIAGNOSIS — Z87891 Personal history of nicotine dependence: Secondary | ICD-10-CM | POA: Insufficient documentation

## 2012-03-05 DIAGNOSIS — E669 Obesity, unspecified: Secondary | ICD-10-CM | POA: Insufficient documentation

## 2012-03-05 DIAGNOSIS — Z9981 Dependence on supplemental oxygen: Secondary | ICD-10-CM | POA: Insufficient documentation

## 2012-03-05 DIAGNOSIS — R040 Epistaxis: Secondary | ICD-10-CM | POA: Insufficient documentation

## 2012-03-05 MED ORDER — OXYMETAZOLINE HCL 0.05 % NA SOLN
NASAL | Status: AC
  Filled 2012-03-05: qty 15

## 2012-03-05 NOTE — ED Provider Notes (Signed)
History     CSN: [id number]  Arrival date & time 03/05/12  2219   First MD Initiated Contact with Patient 03/05/12 2222      Chief Complaint  Patient presents with  . Epistaxis    (Consider location/radiation/quality/duration/timing/severity/associated sxs/prior treatment) HPI Comments: Patient with hx of continuous oxygen use by nasal cannula and she takes coumadin daily due to previous PE's c/o persistent nose bleed from the left nostril.  Incident began suddenly at 7:30 pm tonight after she tried to pick something from her nose.  She states that she has been applying direct pressure, ice and used Afrin nasal spray w/o improvement.  States the bleeding stopped when she arrived at the ED.  She denies dizziness, vomiting, chest pain, shortness of breath, difficulty swallowing or breathing.   Patient is a 62 y.o. female presenting with nosebleeds. The history is provided by the patient.  Epistaxis  This is a new problem. The current episode started 3 to 5 hours ago. The problem occurs constantly. The problem has been resolved. The problem is associated with anticoagulants and nose-picking (oxygen by nasal cannula). The bleeding has been from the left nare. She has tried applying pressure and ice (afrin nasal spray) for the symptoms. The treatment provided significant relief.    Past Medical History  Diagnosis Date  . Unspecified asthma   . Bronchitis, not specified as acute or chronic   . Other pulmonary embolism and infarction   . Acute venous embolism and thrombosis of unspecified deep vessels of lower extremity   . Other hyperalimentation   . Unspecified sleep apnea     NPSG 05-29-07; AHI 32.9,cpap 7- AHI 0/hr weight 382  . Essential hypertension, benign   . Other and unspecified hyperlipidemia   . Esophageal reflux     Past Surgical History  Procedure Laterality Date  . Ivc filter    . Ganglion cyst excision    . Hernia repair    . Hernia abscess    . I&d hernia incisional  abscess    . Endometrial ablation    . Rotator cuff repair      Family History  Problem Relation Age of Onset  . Allergies    . Cancer Mother     thyroid cancer  . Asthma    . Heart disease Father     heart attack  . COPD      History  Substance Use Topics  . Smoking status: Former Games developer  . Smokeless tobacco: Not on file     Comment: only as a teen for 6 months  . Alcohol Use: Not on file    OB History   Grav Para Term Preterm Abortions TAB SAB Ect Mult Living                  Review of Systems  Constitutional: Negative for fever, diaphoresis, activity change and appetite change.  HENT: Positive for nosebleeds. Negative for ear pain, congestion, sore throat, facial swelling, rhinorrhea, trouble swallowing, neck pain and neck stiffness.   Respiratory: Negative for cough, chest tightness and shortness of breath.   Cardiovascular: Negative for chest pain.  Gastrointestinal: Negative for nausea, vomiting, abdominal pain and blood in stool.  Skin: Negative for rash and wound.  Neurological: Negative for dizziness, syncope, weakness, light-headedness, numbness and headaches.  Hematological: Bruises/bleeds easily.  All other systems reviewed and are negative.    Allergies  Review of patient's allergies indicates no known allergies.  Home Medications   Current Outpatient  Rx  Name  Route  Sig  Dispense  Refill  . Acetaminophen (TYLENOL ARTHRITIS PAIN PO)   Oral   Take 1 tablet by mouth 2 (two) times daily.         Marland Kitchen albuterol (ACCUNEB) 1.25 MG/3ML nebulizer solution   Nebulization   Take 1 ampule by nebulization every 6 (six) hours as needed.         Marland Kitchen albuterol (PROAIR HFA) 108 (90 BASE) MCG/ACT inhaler   Inhalation   Inhale 2 puffs into the lungs 4 (four) times daily as needed.         . benazepril (LOTENSIN) 10 MG tablet   Oral   Take 10 mg by mouth daily.         . budesonide (PULMICORT) 180 MCG/ACT inhaler   Inhalation   Inhale 1 puff into the  lungs 2 (two) times daily.         . Calcium Carbonate-Vitamin D (CALCIUM-VITAMIN D) 600-200 MG-UNIT CAPS   Oral   Take 1 capsule by mouth daily.         . cetirizine (ZYRTEC) 10 MG tablet   Oral   Take 10 mg by mouth daily.         . cholecalciferol (VITAMIN D) 1000 UNITS tablet   Oral   Take 1,000 Units by mouth daily.         . cyclobenzaprine (FLEXERIL) 10 MG tablet   Oral   Take 10 mg by mouth 3 (three) times daily as needed.         . diltiazem (CARDIZEM CD) 300 MG 24 hr capsule   Oral   Take 300 mg by mouth daily.         . ENABLEX 7.5 MG 24 hr tablet   Oral   Take 1 tablet by mouth daily.         . famotidine (PEPCID) 20 MG tablet   Oral   Take 20 mg by mouth at bedtime as needed.         . fluticasone (FLONASE) 50 MCG/ACT nasal spray   Nasal   Place 2 sprays into the nose daily.         . Fluticasone-Salmeterol (ADVAIR) 500-50 MCG/DOSE AEPB   Inhalation   Inhale 1 puff into the lungs every 12 (twelve) hours.         . furosemide (LASIX) 20 MG tablet   Oral   Take 20 mg by mouth daily as needed.         . polyethylene glycol powder (GLYCOLAX/MIRALAX) powder   Oral   Take 17 g by mouth daily.         . pravastatin (PRAVACHOL) 80 MG tablet   Oral   Take 80 mg by mouth daily.         . traMADol (ULTRAM) 50 MG tablet   Oral   Take 2 tablets by mouth At bedtime.         . trospium (SANCTURA) 20 MG tablet   Oral   Take 2 tablets by mouth Daily.         . vitamin C (ASCORBIC ACID) 500 MG tablet   Oral   Take 500 mg by mouth daily.         Marland Kitchen warfarin (COUMADIN) 5 MG tablet      Take as directed           BP 165/99  Pulse 101  Temp(Src) 98.8 F (37.1 C) (Oral)  Ht  5\' 8"  (1.727 m)  Wt 375 lb (170.099 kg)  BMI 57.03 kg/m2  SpO2 94%  Physical Exam  Nursing note and vitals reviewed. Constitutional: She is oriented to person, place, and time. She appears well-developed and well-nourished. No distress.  Patient  is obese  HENT:  Head: Normocephalic and atraumatic.  Mouth/Throat: Uvula is midline and mucous membranes are normal. No edematous.  Clotted blood in the left nostril with excoriation to the anterior septum.  Clotted blood also seen in the oropharynx.    Neck: Normal range of motion. Neck supple.  Cardiovascular: Normal rate, regular rhythm, normal heart sounds and intact distal pulses.   No murmur heard. Pulmonary/Chest: Effort normal and breath sounds normal. No respiratory distress. She has no wheezes. She has no rales. She exhibits no tenderness.  Abdominal: Soft. She exhibits no distension. There is no tenderness. There is no rebound and no guarding.  Musculoskeletal: Normal range of motion.  Lymphadenopathy:    She has no cervical adenopathy.  Neurological: She is alert and oriented to person, place, and time. She exhibits normal muscle tone. Coordination normal.  Skin: Skin is warm and dry.    ED Course  Procedures (including critical care time)  Results for orders placed during the hospital encounter of 03/05/12  PROTIME-INR      Result Value Range   Prothrombin Time 27.4 (*) 11.6 - 15.2 seconds   INR 2.71 (*) 0.00 - 1.49  APTT      Result Value Range   aPTT 43 (*) 24 - 37 seconds  CBC WITH DIFFERENTIAL      Result Value Range   WBC 9.4  4.0 - 10.5 K/uL   RBC 4.06  3.87 - 5.11 MIL/uL   Hemoglobin 12.3  12.0 - 15.0 g/dL   HCT 16.1  09.6 - 04.5 %   MCV 93.8  78.0 - 100.0 fL   MCH 30.3  26.0 - 34.0 pg   MCHC 32.3  30.0 - 36.0 g/dL   RDW 40.9  81.1 - 91.4 %   Platelets 289  150 - 400 K/uL   Neutrophils Relative 76  43 - 77 %   Neutro Abs 7.1  1.7 - 7.7 K/uL   Band Neutrophils 0  0 - 10 %   Lymphocytes Relative 17  12 - 46 %   Lymphs Abs 1.6  0.7 - 4.0 K/uL   Monocytes Relative 6  3 - 12 %   Monocytes Absolute 0.6  0.1 - 1.0 K/uL   Eosinophils Relative 1  0 - 5 %   Eosinophils Absolute 0.1  0.0 - 0.7 K/uL   Basophils Relative 0  0 - 1 %   Basophils Absolute 0.0   0.0 - 0.1 K/uL   LUCs, % 0  0 - 4 %   LUC, Absolute 0  0.0 - 0.5 K/uL   WBC Morphology 0     RBC Morphology 0     Smear Review 0     Other 0     Other 2 0     nRBC 0  0 /100 WBC   Metamyelocytes Relative 0     Myelocytes 0     Promyelocytes Absolute 0     Blasts 0           MDM   Patient has been observed for greater than 2 hours without further epistaxis.  She has drank fluids and ate a snack w/o difficulty.  INR is therapeutic.  She agrees to  use saline nasal spray, avoid blowing her nose and to use direct pressure to her nose if the bleeding returns.  She appears stable for d/c, Advised her to return here if needed       Haneef Hallquist L. [place], [place] 03/06/12 [phone]

## 2012-03-05 NOTE — ED Notes (Signed)
Was picking at something in her nose with a tissue and her nose started bleeding. Used Afrin prior to EMS arrival. Nose still bleeding at this time but a lot less.

## 2012-03-06 LAB — CBC WITH DIFFERENTIAL/PLATELET
Band Neutrophils: 0 % (ref 0–10)
Basophils Relative: 0 % (ref 0–1)
Blasts: 0 %
HCT: 38.1 % (ref 36.0–46.0)
Hemoglobin: 12.3 g/dL (ref 12.0–15.0)
LUC, Absolute: 0 10*3/uL (ref 0.0–0.5)
LUCs, %: 0 % (ref 0–4)
Lymphs Abs: 1.6 10*3/uL (ref 0.7–4.0)
Monocytes Relative: 6 % (ref 3–12)
Myelocytes: 0 %
Neutro Abs: 7.1 10*3/uL (ref 1.7–7.7)
Other: 0 %
Smear Review: 0
WBC Morphology: 0
WBC: 9.4 10*3/uL (ref 4.0–10.5)

## 2012-03-06 LAB — APTT: aPTT: 43 seconds — ABNORMAL HIGH (ref 24–37)

## 2012-03-06 NOTE — ED Notes (Addendum)
Patient rang call bell and informed me that her nose was bleeding again. Dr. Estell Harpin informed due to patient remaining in the ER due to having no ride home and on continuous oxygen at 3L [place]. Patient informed to hold pressure to nose.

## 2012-03-06 NOTE — ED Notes (Signed)
Patient states that her nose has quit bleeding and that she is finished with her food at this time. NAD noted at this time.

## 2012-03-06 NOTE — ED Provider Notes (Signed)
Medical screening examination/treatment/procedure(s) were performed by non-physician practitioner and as supervising physician I was immediately available for consultation/collaboration.   Benny Lennert, MD 03/06/12 [phone]

## 2012-11-09 ENCOUNTER — Ambulatory Visit (INDEPENDENT_AMBULATORY_CARE_PROVIDER_SITE_OTHER): Payer: Medicare Other | Admitting: Internal Medicine

## 2012-11-09 ENCOUNTER — Encounter: Payer: Self-pay | Admitting: Internal Medicine

## 2012-11-09 VITALS — BP 124/64 | HR 75 | Ht 66.0 in | Wt 390.2 lb

## 2012-11-09 DIAGNOSIS — E678 Other specified hyperalimentation: Secondary | ICD-10-CM

## 2012-11-09 DIAGNOSIS — R0902 Hypoxemia: Secondary | ICD-10-CM

## 2012-11-09 DIAGNOSIS — J9611 Chronic respiratory failure with hypoxia: Secondary | ICD-10-CM

## 2012-11-09 DIAGNOSIS — G4730 Sleep apnea, unspecified: Secondary | ICD-10-CM

## 2012-11-09 DIAGNOSIS — J9610 Chronic respiratory failure, unspecified whether with hypoxia or hypercapnia: Secondary | ICD-10-CM

## 2012-11-09 NOTE — Progress Notes (Signed)
62 yoF followed for OSA, OHS, morbid obesity, complicated by hx DVT, asthma/ bronchitis, GERD LOV- 03/27/10   daughter and granddaughter here.   PCP Dr. Eula Listen She continues oxygen 3 L per minute continuously/[name]. CPAP 7 CWP is used all night every night without a humidifier by her preference. She feels that she is breathing "through a fog". Hoarseness comes and goes with any strong odor. Still using Advair, nebulizer up to 4 times daily and rescue inhaler less than every day. Recognizes some reflux. Last PFT was 05/05/2007.  05/09/11- 60 yoF followed for OSA, OHS, morbid obesity, complicated by hx DVT, asthma/ bronchitis, GERD Breathing "is about the same" as when seen 03/27/11, pt states voice is improving. Pt had PFT 04/12/11- to be reviewed with pt.   We gave Benicar instead of her ACE inhibitor. With the change, she noticed less cough and hoarseness then with lisinopril. Some heartburn on OTC acid blocker. Using Pulmicort powder. Comfortable with CPAP used all might every night. PFT 04/12/2011-normal spirometry flows within significant response to bronchodilator. FEV1/FVC 0.79. Mild restriction and moderate diffusion reduction both may be do to her obesity.  11/08/11- 61 yoF followed for OSA, OHS, morbid obesity, complicated by hx DVT/ PE, asthma/ bronchitis, GERD SOB and wheezing with exertion; wears O2 all the time She is staying on oxygen at 3 L. CPAP 7/[name] plus oxygen at 3 L, good compliance and control with no concerns. She has not lost weight [phone]). Sample Dulera work during well but she preferred to stay on Advair, which she was used to. Dr Donette Larry manages her Coumadin.  11/09/12-  62 yoF followed for OSA, OHS, morbid obesity, complicated by hx DVT/ PE, asthma/ bronchitis, GERD SOB and wheezing with exertion; wears O2 all the time Follows For: SOB a little worse - Some wheezing - Cough occas prod (tlight yellow -clear) - Wearing CPAP 9-10 hrs/night - Mask fits well CPAP  7/[name] plus oxygen at 3 L continuous.  Weight up 14  More lbs since 2013. Minor cold.  Stays indoors to avoid pollen and odors. Helps to use neb twice daily, Advair 500.   ROS-see HPI Constitutional:   No-   weight loss, night sweats, fevers, chills, fatigue, lassitude. HEENT:   No-  headaches, difficulty swallowing, tooth/dental problems, sore throat,       No-  sneezing, itching, ear ache, +nasal congestion, post nasal drip,  CV:  No-   chest pain, orthopnea, PND, swelling in lower extremities, anasarca,  dizziness, palpitations Resp: + shortness of breath with exertion or at rest.              No-   productive cough,  Little non-productive cough,  No- coughing up of blood.              No-   change in color of mucus.  No- wheezing.   Skin: No-   rash or lesions. GI:  No-   heartburn, indigestion, abdominal pain, nausea, vomiting,  GU:  MS:  No-   joint pain or swelling.   Neuro-     nothing unusual Psych:  No- change in mood or affect. No depression or anxiety.  No memory loss.  OBJ- Physical Exam General- Alert, Oriented, Affect-appropriate, Distress- none acute. Morbid obesity. O2 3L Skin- rash-none, lesions- none, excoriation- none Lymphadenopathy- none Head- atraumatic            Eyes- Gross vision intact, PERRLA, conjunctivae and secretions clear  Ears- Hearing, canals-normal            Nose- Clear, no-Septal dev, mucus, polyps, erosion, perforation             Throat- Mallampati II-III , mucosa clear , drainage- none, tonsils- atrophic. Mild hoarseness, throat clearing Neck- flexible , trachea midline, no stridor , thyroid nl, carotid no bruit Chest - symmetrical excursion , unlabored           Heart/CV- RRR , no murmur , no gallop  , no rub, nl s1 s2                           - JVD- none , edema- none, stasis changes- none, varices- none           Lung- clear to P&A, wheeze- none, cough- none , dullness-none, rub- none. Shallow c/w habitus           Chest  wall-  Abd- Br/ Gen/ Rectal- Not done, not indicated Extrem- cyanosis- none, clubbing, none, atrophy- none, strength- nl Neuro- grossly intact to observation

## 2012-11-09 NOTE — Patient Instructions (Signed)
Flu vax  We can continue CPAP 7/ O2 3L/ [name]  Please call as needed

## 2012-11-24 NOTE — Assessment & Plan Note (Signed)
Pressure and compliance good

## 2012-11-24 NOTE — Assessment & Plan Note (Signed)
Continues O2 dependent Plan flu vax

## 2012-11-24 NOTE — Assessment & Plan Note (Signed)
Seh declined offer for bariatric referral- " I know what to do"

## 2012-11-26 ENCOUNTER — Other Ambulatory Visit: Payer: Self-pay

## 2013-03-20 ENCOUNTER — Encounter: Payer: Self-pay | Admitting: *Deleted

## 2013-07-05 DIAGNOSIS — Z9889 Other specified postprocedural states: Secondary | ICD-10-CM | POA: Insufficient documentation

## 2013-08-11 ENCOUNTER — Other Ambulatory Visit (HOSPITAL_COMMUNITY): Payer: Self-pay | Admitting: Internal Medicine

## 2013-08-11 ENCOUNTER — Ambulatory Visit (HOSPITAL_COMMUNITY): Payer: Medicare HMO | Attending: Cardiovascular Disease

## 2013-08-11 DIAGNOSIS — J40 Bronchitis, not specified as acute or chronic: Secondary | ICD-10-CM | POA: Insufficient documentation

## 2013-08-11 DIAGNOSIS — I10 Essential (primary) hypertension: Secondary | ICD-10-CM | POA: Insufficient documentation

## 2013-08-11 DIAGNOSIS — R0602 Shortness of breath: Secondary | ICD-10-CM

## 2013-08-11 DIAGNOSIS — J9610 Chronic respiratory failure, unspecified whether with hypoxia or hypercapnia: Secondary | ICD-10-CM | POA: Insufficient documentation

## 2013-08-11 DIAGNOSIS — E785 Hyperlipidemia, unspecified: Secondary | ICD-10-CM | POA: Insufficient documentation

## 2013-08-11 DIAGNOSIS — G4733 Obstructive sleep apnea (adult) (pediatric): Secondary | ICD-10-CM | POA: Insufficient documentation

## 2013-08-11 DIAGNOSIS — I82409 Acute embolism and thrombosis of unspecified deep veins of unspecified lower extremity: Secondary | ICD-10-CM | POA: Insufficient documentation

## 2013-08-11 DIAGNOSIS — I2699 Other pulmonary embolism without acute cor pulmonale: Secondary | ICD-10-CM | POA: Insufficient documentation

## 2013-08-11 DIAGNOSIS — Z87891 Personal history of nicotine dependence: Secondary | ICD-10-CM | POA: Insufficient documentation

## 2013-08-11 DIAGNOSIS — K219 Gastro-esophageal reflux disease without esophagitis: Secondary | ICD-10-CM | POA: Insufficient documentation

## 2013-08-11 DIAGNOSIS — J45909 Unspecified asthma, uncomplicated: Secondary | ICD-10-CM | POA: Insufficient documentation

## 2013-08-11 DIAGNOSIS — I517 Cardiomegaly: Secondary | ICD-10-CM | POA: Insufficient documentation

## 2013-08-11 NOTE — Progress Notes (Signed)
2D Echo completed. 08/11/2013 

## 2013-08-13 DIAGNOSIS — Z7901 Long term (current) use of anticoagulants: Secondary | ICD-10-CM | POA: Insufficient documentation

## 2013-08-17 ENCOUNTER — Encounter: Payer: Self-pay | Admitting: Cardiology

## 2013-08-17 ENCOUNTER — Ambulatory Visit (HOSPITAL_COMMUNITY)
Admission: RE | Admit: 2013-08-17 | Discharge: 2013-08-17 | Disposition: A | Discharge status: Home / Self Care | Payer: Medicare HMO | Source: Ambulatory Visit | Attending: Cardiology | Admitting: Cardiology

## 2013-08-17 ENCOUNTER — Other Ambulatory Visit: Payer: Self-pay | Admitting: Cardiology

## 2013-08-17 ENCOUNTER — Ambulatory Visit (INDEPENDENT_AMBULATORY_CARE_PROVIDER_SITE_OTHER): Payer: Commercial Managed Care - HMO | Admitting: Cardiology

## 2013-08-17 ENCOUNTER — Encounter: Payer: Self-pay | Admitting: Internal Medicine

## 2013-08-17 VITALS — BP 130/68 | HR 88 | Ht 66.0 in | Wt >= 6400 oz

## 2013-08-17 DIAGNOSIS — Z01818 Encounter for other preprocedural examination: Secondary | ICD-10-CM | POA: Insufficient documentation

## 2013-08-17 DIAGNOSIS — R0609 Other forms of dyspnea: Principal | ICD-10-CM

## 2013-08-17 DIAGNOSIS — E782 Mixed hyperlipidemia: Secondary | ICD-10-CM

## 2013-08-17 DIAGNOSIS — I10 Essential (primary) hypertension: Secondary | ICD-10-CM

## 2013-08-17 DIAGNOSIS — Z5189 Encounter for other specified aftercare: Secondary | ICD-10-CM

## 2013-08-17 DIAGNOSIS — E662 Morbid (severe) obesity with alveolar hypoventilation: Secondary | ICD-10-CM

## 2013-08-17 DIAGNOSIS — D6859 Other primary thrombophilia: Secondary | ICD-10-CM

## 2013-08-17 DIAGNOSIS — R0989 Other specified symptoms and signs involving the circulatory and respiratory systems: Secondary | ICD-10-CM

## 2013-08-17 DIAGNOSIS — T671XXD Heat syncope, subsequent encounter: Secondary | ICD-10-CM

## 2013-08-17 LAB — CBC WITH DIFFERENTIAL/PLATELET
BASOS ABS: 0 10*3/uL (ref 0.0–0.1)
Basophils Relative: 0 % (ref 0–1)
Eosinophils Absolute: 0.1 10*3/uL (ref 0.0–0.7)
Eosinophils Relative: 1 % (ref 0–5)
HEMATOCRIT: 34.5 % — AB (ref 36.0–46.0)
HEMOGLOBIN: 11.4 g/dL — AB (ref 12.0–15.0)
LYMPHS PCT: 21 % (ref 12–46)
Lymphs Abs: 1.6 10*3/uL (ref 0.7–4.0)
MCH: 29.5 pg (ref 26.0–34.0)
MCHC: 33 g/dL (ref 30.0–36.0)
MCV: 89.4 fL (ref 78.0–100.0)
MONO ABS: 0.5 10*3/uL (ref 0.1–1.0)
MONOS PCT: 7 % (ref 3–12)
NEUTROS ABS: 5.3 10*3/uL (ref 1.7–7.7)
Neutrophils Relative %: 71 % (ref 43–77)
Platelets: 252 10*3/uL (ref 150–400)
RBC: 3.86 MIL/uL — AB (ref 3.87–5.11)
RDW: 13.9 % (ref 11.5–15.5)
WBC: 7.4 10*3/uL (ref 4.0–10.5)

## 2013-08-17 LAB — BASIC METABOLIC PANEL
BUN: 17 mg/dL (ref 6–23)
CHLORIDE: 101 meq/L (ref 96–112)
CO2: 28 mEq/L (ref 19–32)
Calcium: 9.1 mg/dL (ref 8.4–10.5)
Creat: 0.93 mg/dL (ref 0.50–1.10)
GLUCOSE: 104 mg/dL — AB (ref 70–99)
POTASSIUM: 3.9 meq/L (ref 3.5–5.3)
SODIUM: 139 meq/L (ref 135–145)

## 2013-08-17 LAB — PROTIME-INR
INR: 1.54 — AB (ref <1.50)
Prothrombin Time: 18.5 seconds — ABNORMAL HIGH (ref 11.6–15.2)

## 2013-08-17 NOTE — Assessment & Plan Note (Signed)
Followed by Dr. Young. 

## 2013-08-17 NOTE — Assessment & Plan Note (Signed)
On Cozaar and Cardizem CD.

## 2013-08-17 NOTE — Assessment & Plan Note (Signed)
Patient has a history of factor V Leiden mutation, prior multiple DVTs and pulmonary emboli, status post IVC filter, and on chronic Coumadin.

## 2013-08-17 NOTE — Progress Notes (Signed)
Clinical Summary Rhonda Acevedo is a medically complex 63 y.o.female with past medical history outlined below, referred by Dr. Inda Merlin for evaluation of progressive shortness of breath. She has a history of morbid obesity, OSA, COPD as well as prior DVT and pulmonary emboli status post IVC filter and on chronic Coumadin. Patient is also followed by Dr. Annamaria Boots for management of OSA. She is here with her daughter today. She reports that since May she has been more short of breath with activity (NYHA class III), to the point that she has to stop when walking and is wheezing at the time, although when she stops her symptoms resolve and she does not need rescue inhalers. She reports that this pattern is different from her typical problems with asthma and COPD. She has had no chest discomfort. There is concern that her symptoms may be a reflection of underlying ischemic heart disease it is not yet diagnosed.  She reports no major change in her medications over this time., No other change in clinical status. She continues on long-term Coumadin, has had no recent bleeding problems. She uses a cane to walk.  Dobutamine Myoview from 2007 was a limited examination due to body habitus, however indicated no clear evidence of ischemia, possible scarring in the distal anterior and inferior apical walls, LVEF was 57%. Echocardiogram just done on July 22 reported LVEF 55-60%, unable to adequately assess wall motion, normal diastolic parameters, and mild left atrial enlargement.  ECG today shows normal sinus rhythm with nonspecific T-wave changes.   Allergies  Allergen Reactions  . Benazepril Hcl Cough    Current Outpatient Prescriptions  Medication Sig Dispense Refill  . Acetaminophen (TYLENOL ARTHRITIS PAIN PO) Take 1 tablet by mouth 2 (two) times daily.      Marland Kitchen albuterol (ACCUNEB) 1.25 MG/3ML nebulizer solution Take 1 ampule by nebulization every 6 (six) hours as needed.      Marland Kitchen albuterol (PROAIR HFA) 108 (90 BASE)  MCG/ACT inhaler Inhale 2 puffs into the lungs 4 (four) times daily as needed.      . Calcium Carbonate-Vitamin D (CALCIUM-VITAMIN D) 600-200 MG-UNIT CAPS Take 1 capsule by mouth daily.      . cetirizine (ZYRTEC) 10 MG tablet Take 10 mg by mouth daily.      . Cholecalciferol (VITAMIN D3) 2000 UNITS TABS Take 1 tablet by mouth daily.      . cyclobenzaprine (FLEXERIL) 10 MG tablet Take 10 mg by mouth 3 (three) times daily as needed.      . diltiazem (CARDIZEM CD) 300 MG 24 hr capsule Take 300 mg by mouth daily.      . ENABLEX 7.5 MG 24 hr tablet Take 1 tablet by mouth daily.      . famotidine (PEPCID) 20 MG tablet Take 20 mg by mouth 2 (two) times daily as needed.       . fluticasone (FLONASE) 50 MCG/ACT nasal spray Place 1 spray into the nose 2 (two) times daily.       . Fluticasone-Salmeterol (ADVAIR) 500-50 MCG/DOSE AEPB Inhale 1 puff into the lungs every 12 (twelve) hours.      . furosemide (LASIX) 20 MG tablet Take 20 mg by mouth 2 (two) times daily.       Marland Kitchen losartan (COZAAR) 100 MG tablet Take 50 mg by mouth. 1/2 tablet at bedtime      . polyethylene glycol powder (GLYCOLAX/MIRALAX) powder Take 17 g by mouth daily.      . pravastatin (PRAVACHOL) 80 MG  tablet Take 80 mg by mouth daily.      . traMADol (ULTRAM) 50 MG tablet Take 50 mg by mouth every 6 (six) hours as needed.       . vitamin C (ASCORBIC ACID) 500 MG tablet Take 500 mg by mouth daily.      Marland Kitchen warfarin (COUMADIN) 10 MG tablet Take one tablet daily except none of Friday      . warfarin (COUMADIN) 5 MG tablet Take as directed       No current facility-administered medications for this visit.    Past Medical History  Diagnosis Date  . Obstructive sleep apnea     NPSG 05-29-07; AHI 32.9,cpap 7- AHI 0/hr weight 382  . Essential hypertension, benign   . Mixed hyperlipidemia   . Esophageal reflux   . COPD (chronic obstructive pulmonary disease)   . History of pulmonary embolism     Multiple, status post IVC filter, on Coumadin  .  History of DVT (deep vein thrombosis)   . Atrial fibrillation   . GERD (gastroesophageal reflux disease)   . Osteoarthritis   . Factor V Leiden mutation   . Allergic rhinitis     Past Surgical History  Procedure Laterality Date  . Ivc filter    . Ganglion cyst excision    . Hernia repair    . Hernia abscess    . I&d hernia incisional abscess    . Endometrial ablation    . Rotator cuff repair      Family History  Problem Relation Age of Onset  . Allergies    . Cancer Mother     Thyroid cancer  . Asthma    . Heart disease Father     Heart attack  . COPD      Social History Ms. Strausser reports that she has never smoked. She does not have any smokeless tobacco history on file. Ms. Dorsi reports that she does not drink alcohol.  Review of Systems No palpitations or syncope. Chronic leg edema. No progressive cough or hemoptysis. No fevers or chills. Other systems reviewed and negative except as outlined.  Physical Examination Filed Vitals:   08/17/13 1337  BP: 130/68  Pulse: 88   Filed Weights   08/17/13 1337  Weight: 411 lb (186.428 kg)   Morbidly obese woman, appears comfortable at rest. States that she was significant for short of breath walking into the office from the parking lot however. HEENT: Conjunctiva and lids normal, oropharynx clear. Neck: Supple, no elevated JVP or carotid bruits, no thyromegaly. Lungs: Decreased breath sounds without wheezing, nonlabored breathing at rest. Cardiac: Distant, regular rate and rhythm, no S3, soft systolic murmur, no pericardial rub. Abdomen: Morbidly obese with pannus, nontender, bowel sounds present, no guarding or rebound. Extremities: Chronic appearing edema is mild with associated adipose tissue, distal pulses 2+. Skin: Warm and dry. Musculoskeletal: No kyphosis. Neuropsychiatric: Alert and oriented x3, affect grossly appropriate.   Problem List and Plan   Exertional dyspnea Ms. Mcneely has multiple reasons to be  short of breath including morbid obesity, COPD with asthma, exertional hypoxia requiring supplemental oxygen, and a prior history of multiple pulmonary emboli status post IVC filter and on chronic Coumadin. She is fairly clear in indicating however that her symptoms have changed since May, with much more exertional shortness of breath that does not persist when she rests, and does not require rescue inhalers. She is referred by Dr. Inda Merlin with a concern for possible cardiac asthma and to exclude  underlying obstructive CAD contributing to her symptomatology. Recent echocardiogram was a limited study but showed preserved LVEF and normal diastolic parameters. PA pressures were not obtained. This presents a diagnostic difficulty particularly based on her body habitus as it relates to noninvasive testing. Nuclear stress testing would almost certainly be fraught with artifact, and not particularly helpful at this point. A dobutamine echocardiogram with Definity contrast could be considered, but she has a history of PAF on high-dose Cardizem which would need to be interrupted, and it is not entirely clear that we would obtain optimal images even with that study. Alternatively, a diagnostic heart catheterization via the radial approach is more likely to provide diagnostic information regarding her coronary anatomy. Complicating this however is a need for long-term anticoagulation in light of her history and hypercoagulable state. We have discussed the risks and benefits, and she is willing to proceed. Our plan is to coordinate Lovenox bridging while off Coumadin through our anticoagulation clinic in the short-term, and schedule this procedure as soon as is feasible. Her INR today was 4.0.  HYPERLIPIDEMIA On Pravachol, followed by Dr. Inda Merlin.  Essential hypertension, benign  On Cozaar and Cardizem CD.  Hypercoagulable state Patient has a history of factor V Leiden mutation, prior multiple DVTs and pulmonary emboli,  status post IVC filter, and on chronic Coumadin.  OBESITY HYPOVENTILATION SYNDROME Followed by Dr. Annamaria Boots.    Satira Sark, M.D., F.A.C.C.

## 2013-08-17 NOTE — Patient Instructions (Addendum)
Return on Thursday to have INR checked with Rhonda Acevedo physician has requested that you have a cardiac catheterization. Cardiac catheterization is used to diagnose and/or treat various heart conditions. Doctors may recommend this procedure for a number of different reasons. The most common reason is to evaluate chest pain. Chest pain can be a symptom of coronary artery disease (CAD), and cardiac catheterization can show whether plaque is narrowing or blocking your heart's arteries. This procedure is also used to evaluate the valves, as well as measure the blood flow and oxygen levels in different parts of your heart. For further information please visit [email or web address]. Please follow instruction sheet, as given.  Thank you for choosing [facility] !

## 2013-08-17 NOTE — Assessment & Plan Note (Signed)
On Pravachol, followed by Dr. Inda Merlin.

## 2013-08-17 NOTE — Assessment & Plan Note (Signed)
Ms. Plyler has multiple reasons to be short of breath including morbid obesity, COPD with asthma, exertional hypoxia requiring supplemental oxygen, and a prior history of multiple pulmonary emboli status post IVC filter and on chronic Coumadin. She is fairly clear in indicating however that her symptoms have changed since May, with much more exertional shortness of breath that does not persist when she rests, and does not require rescue inhalers. She is referred by Dr. Inda Merlin with a concern for possible cardiac asthma and to exclude underlying obstructive CAD contributing to her symptomatology. Recent echocardiogram was a limited study but showed preserved LVEF and normal diastolic parameters. PA pressures were not obtained. This presents a diagnostic difficulty particularly based on her body habitus as it relates to noninvasive testing. Nuclear stress testing would almost certainly be fraught with artifact, and not particularly helpful at this point. A dobutamine echocardiogram with Definity contrast could be considered, but she has a history of PAF on high-dose Cardizem which would need to be interrupted, and it is not entirely clear that we would obtain optimal images even with that study. Alternatively, a diagnostic heart catheterization via the radial approach is more likely to provide diagnostic information regarding her coronary anatomy. Complicating this however is a need for long-term anticoagulation in light of her history and hypercoagulable state. We have discussed the risks and benefits, and she is willing to proceed. Our plan is to coordinate Lovenox bridging while off Coumadin through our anticoagulation clinic in the short-term, and schedule this procedure as soon as is feasible. Her INR today was 4.0.

## 2013-08-18 ENCOUNTER — Ambulatory Visit (INDEPENDENT_AMBULATORY_CARE_PROVIDER_SITE_OTHER): Payer: Commercial Managed Care - HMO | Admitting: *Deleted

## 2013-08-18 DIAGNOSIS — Z5181 Encounter for therapeutic drug level monitoring: Secondary | ICD-10-CM

## 2013-08-18 NOTE — Patient Instructions (Addendum)
Pt here to be bridged with Lovenox for cardiac cath on 7/31 by  Dr Burt Knack.  See office note by Dr Domenic Polite on 7/28 INR on 7/28 at [facility] was 1.54   Coumadin has been managed by Dr [name] for years.  She has been on 10mg  daily except none on Fridays Pt was started on lovenox 150mg  bid today at the following schedule:  7/29  Lovenox 150mg  sq bid @ 11:30am & 11:00pm 7/30  Lovenox 150mg  sq bid @ 9am & 9pm  7/31  No Lovenox -------Cardiac Cath -----Coumadin 10mg  pm 8/1  Lovenox 150mg  sq bid @ 9am & 9pm and coumadin 10mg  pm 8/2  Lovenox 150mg  sq bid @ 9am & 9pm and coumadin 10mg  pm 8/3  Lovenox 150mg  sq @ 9am  ----INR appt @ 1:10pm  Schedule given to pt and daughter and they verbalized understanding.  They have administered lovenox injections before and feel comfortable doing so again.

## 2013-08-19 NOTE — Addendum Note (Signed)
Addended by: Truett Mainland on: 08/19/2013 10:55 AM   Modules accepted: Orders

## 2013-08-19 NOTE — Addendum Note (Signed)
Addended by: Truett Mainland on: 08/19/2013 10:57 AM   Modules accepted: Orders

## 2013-08-20 ENCOUNTER — Encounter (HOSPITAL_COMMUNITY)
Admission: RE | Disposition: A | Discharge status: Home / Self Care | Payer: Commercial Managed Care - HMO | Source: Ambulatory Visit | Attending: Cardiovascular Disease

## 2013-08-20 ENCOUNTER — Ambulatory Visit (HOSPITAL_COMMUNITY)
Admission: RE | Admit: 2013-08-20 | Discharge: 2013-08-20 | Disposition: A | Discharge status: Home / Self Care | Payer: Medicare HMO | Source: Ambulatory Visit | Attending: Cardiovascular Disease | Admitting: Cardiovascular Disease

## 2013-08-20 DIAGNOSIS — Z79899 Other long term (current) drug therapy: Secondary | ICD-10-CM | POA: Insufficient documentation

## 2013-08-20 DIAGNOSIS — R0609 Other forms of dyspnea: Secondary | ICD-10-CM | POA: Insufficient documentation

## 2013-08-20 DIAGNOSIS — K219 Gastro-esophageal reflux disease without esophagitis: Secondary | ICD-10-CM | POA: Insufficient documentation

## 2013-08-20 DIAGNOSIS — R0989 Other specified symptoms and signs involving the circulatory and respiratory systems: Secondary | ICD-10-CM | POA: Insufficient documentation

## 2013-08-20 DIAGNOSIS — E785 Hyperlipidemia, unspecified: Secondary | ICD-10-CM | POA: Diagnosis not present

## 2013-08-20 DIAGNOSIS — I4891 Unspecified atrial fibrillation: Secondary | ICD-10-CM | POA: Insufficient documentation

## 2013-08-20 DIAGNOSIS — R0602 Shortness of breath: Secondary | ICD-10-CM | POA: Diagnosis present

## 2013-08-20 DIAGNOSIS — Z86711 Personal history of pulmonary embolism: Secondary | ICD-10-CM | POA: Diagnosis not present

## 2013-08-20 DIAGNOSIS — Z86718 Personal history of other venous thrombosis and embolism: Secondary | ICD-10-CM | POA: Insufficient documentation

## 2013-08-20 DIAGNOSIS — I10 Essential (primary) hypertension: Secondary | ICD-10-CM | POA: Insufficient documentation

## 2013-08-20 DIAGNOSIS — E662 Morbid (severe) obesity with alveolar hypoventilation: Secondary | ICD-10-CM | POA: Insufficient documentation

## 2013-08-20 DIAGNOSIS — G4733 Obstructive sleep apnea (adult) (pediatric): Secondary | ICD-10-CM | POA: Diagnosis not present

## 2013-08-20 DIAGNOSIS — M1990 Unspecified osteoarthritis, unspecified site: Secondary | ICD-10-CM | POA: Insufficient documentation

## 2013-08-20 DIAGNOSIS — D6859 Other primary thrombophilia: Secondary | ICD-10-CM | POA: Diagnosis not present

## 2013-08-20 DIAGNOSIS — Z7901 Long term (current) use of anticoagulants: Secondary | ICD-10-CM | POA: Diagnosis not present

## 2013-08-20 DIAGNOSIS — Z6841 Body Mass Index (BMI) 40.0 and over, adult: Secondary | ICD-10-CM | POA: Insufficient documentation

## 2013-08-20 DIAGNOSIS — J449 Chronic obstructive pulmonary disease, unspecified: Secondary | ICD-10-CM | POA: Diagnosis not present

## 2013-08-20 DIAGNOSIS — J4489 Other specified chronic obstructive pulmonary disease: Secondary | ICD-10-CM | POA: Insufficient documentation

## 2013-08-20 HISTORY — PX: LEFT HEART CATHETERIZATION WITH CORONARY ANGIOGRAM: SHX5451

## 2013-08-20 LAB — PROTIME-INR
INR: 1.15 (ref 0.00–1.49)
Prothrombin Time: 14.7 seconds (ref 11.6–15.2)

## 2013-08-20 SURGERY — LEFT HEART CATHETERIZATION WITH CORONARY ANGIOGRAM
Anesthesia: LOCAL

## 2013-08-20 MED ORDER — NITROGLYCERIN 1 MG/10 ML FOR IR/CATH LAB
INTRA_ARTERIAL | Status: AC
  Filled 2013-08-20: qty 10

## 2013-08-20 MED ORDER — ONDANSETRON HCL 4 MG/2ML IJ SOLN: 4.0000 mg | Freq: Four times a day (QID) | INTRAMUSCULAR | Status: DC | PRN

## 2013-08-20 MED ORDER — ACETAMINOPHEN 325 MG PO TABS: 650.0000 mg | ORAL_TABLET | ORAL | Status: DC | PRN

## 2013-08-20 MED ORDER — MIDAZOLAM HCL 2 MG/2ML IJ SOLN
INTRAMUSCULAR | Status: AC
  Filled 2013-08-20: qty 2

## 2013-08-20 MED ORDER — SODIUM CHLORIDE 0.9 % IV SOLN
INTRAVENOUS | Status: DC
  Administered 2013-08-20: 50 mL/h via INTRAVENOUS

## 2013-08-20 MED ORDER — SODIUM CHLORIDE 0.9 % IJ SOLN: 3.0000 mL | INTRAMUSCULAR | Status: DC | PRN

## 2013-08-20 MED ORDER — ASPIRIN 81 MG PO CHEW
CHEWABLE_TABLET | ORAL | Status: AC
  Filled 2013-08-20: qty 1

## 2013-08-20 MED ORDER — SODIUM CHLORIDE 0.9 % IV SOLN: INTRAVENOUS | Status: DC

## 2013-08-20 MED ORDER — FENTANYL CITRATE 0.05 MG/ML IJ SOLN
INTRAMUSCULAR | Status: AC
  Filled 2013-08-20: qty 2

## 2013-08-20 MED ORDER — SODIUM CHLORIDE 0.9 % IV SOLN: 250.0000 mL | INTRAVENOUS | Status: DC | PRN

## 2013-08-20 MED ORDER — SODIUM CHLORIDE 0.9 % IJ SOLN: 3.0000 mL | Freq: Two times a day (BID) | INTRAMUSCULAR | Status: DC

## 2013-08-20 MED ORDER — ASPIRIN 81 MG PO CHEW
81.0000 mg | CHEWABLE_TABLET | ORAL | Status: AC
  Administered 2013-08-20: 81 mg via ORAL

## 2013-08-20 MED ORDER — VERAPAMIL HCL 2.5 MG/ML IV SOLN
INTRAVENOUS | Status: AC
  Filled 2013-08-20: qty 2

## 2013-08-20 MED ORDER — LIDOCAINE HCL (PF) 1 % IJ SOLN
INTRAMUSCULAR | Status: AC
  Filled 2013-08-20: qty 30

## 2013-08-20 MED ORDER — HEPARIN (PORCINE) IN NACL 2-0.9 UNIT/ML-% IJ SOLN
INTRAMUSCULAR | Status: AC
  Filled 2013-08-20: qty 1000

## 2013-08-20 NOTE — Progress Notes (Signed)
DR Burt Knack IN AND OK TO D/C HOME

## 2013-08-20 NOTE — CV Procedure (Signed)
    Cardiac Catheterization Procedure Note  Name: KEYASIA JOLLIFF MRN: [id number] DOB: [birth year]  Procedure: Left Heart Cath, Selective Coronary Angiography, LV angiography  Indication: Shortness of breath, progressive symptoms, multiple risk factors for CAD   Procedural Details: The right wrist was prepped, draped, and anesthetized with 1% lidocaine. Using the modified Seldinger technique, a 5 French sheath was introduced into the right radial artery. 3 mg of verapamil was administered through the sheath, weight-based unfractionated heparin was administered intravenously. Standard Judkins catheters were used for selective coronary angiography and left ventriculography. Catheter exchanges were performed over an exchange length guidewire. There were no immediate procedural complications. A Rad-Stat was used for radial hemostasis at the completion of the procedure.  The patient was transferred to the post catheterization recovery area for further monitoring.  Procedural Findings: Hemodynamics: AO 149/77 LV 153/17  Coronary angiography: Coronary dominance: right  Left mainstem: The left main is angiographically normal. It arises from the left cusp and it divides into the LAD, intermediate branch, and left circumflex   Left anterior descending (LAD): Large-caliber vessel coursing to the LV apex. The vessel supplies a large first diagonal and small second and third diagonal branches. The LAD and its diagonal branches are angiographically normal throughout.  Left circumflex (LCx): There is a small intermediate branch without significant obstructive disease. The AV circumflex is small and supplies one small OM. The vessel is smooth throughout its distribution without significant stenosis.  Right coronary artery (RCA): Large-caliber dominant vessel. The RCA is smooth throughout its proximal, mid, and distal portions. The PDA and PLA branches are both widely patent.  Left ventriculography: Left  ventricular systolic function is normal, LVEF is estimated at 65%, there is no significant mitral regurgitation   Final Conclusions:   1. Angiographically normal coronary arteries 2. Normal left ventricular systolic function with normal LVEDP  Recommendations: Suspect noncardiac cause of dyspnea. Recommend lifestyle modification and follow-up with primary care physician.   Sherren Mocha MD 08/20/2013, 12:55 PM

## 2013-08-20 NOTE — Interval H&P Note (Signed)
History and Physical Interval Note:  08/20/2013 12:04 PM  Rhonda Acevedo  has presented today for surgery, with the diagnosis of dysmia  The various methods of treatment have been discussed with the patient and family. After consideration of risks, benefits and other options for treatment, the patient has consented to  Procedure(s): LEFT HEART CATHETERIZATION WITH CORONARY ANGIOGRAM (N/A) as a surgical intervention .  The patient's history has been reviewed, patient examined, no change in status, stable for surgery.  I have reviewed the patient's chart and labs.  Questions were answered to the patient's satisfaction.    Cath Lab Visit (complete for each Cath Lab visit)  Clinical Evaluation Leading to the Procedure:   ACS: No.  Non-ACS:    Anginal Classification: CCS III  Anti-ischemic medical therapy: Minimal Therapy (1 class of medications)  Non-Invasive Test Results: No non-invasive testing performed  Prior CABG: No previous CABG       Sherren Mocha

## 2013-08-20 NOTE — H&P (View-Only) (Signed)
Clinical Summary Rhonda Acevedo is a medically complex 63 y.o.female with past medical history outlined below, referred by Dr. Inda Merlin for evaluation of progressive shortness of breath. She has a history of morbid obesity, OSA, COPD as well as prior DVT and pulmonary emboli status post IVC filter and on chronic Coumadin. Patient is also followed by Dr. Annamaria Boots for management of OSA. She is here with her daughter today. She reports that since May she has been more short of breath with activity (NYHA class III), to the point that she has to stop when walking and is wheezing at the time, although when she stops her symptoms resolve and she does not need rescue inhalers. She reports that this pattern is different from her typical problems with asthma and COPD. She has had no chest discomfort. There is concern that her symptoms may be a reflection of underlying ischemic heart disease it is not yet diagnosed.  She reports no major change in her medications over this time., No other change in clinical status. She continues on long-term Coumadin, has had no recent bleeding problems. She uses a cane to walk.  Dobutamine Myoview from 2007 was a limited examination due to body habitus, however indicated no clear evidence of ischemia, possible scarring in the distal anterior and inferior apical walls, LVEF was 57%. Echocardiogram just done on July 22 reported LVEF 55-60%, unable to adequately assess wall motion, normal diastolic parameters, and mild left atrial enlargement.  ECG today shows normal sinus rhythm with nonspecific T-wave changes.   Allergies  Allergen Reactions  . Benazepril Hcl Cough    Current Outpatient Prescriptions  Medication Sig Dispense Refill  . Acetaminophen (TYLENOL ARTHRITIS PAIN PO) Take 1 tablet by mouth 2 (two) times daily.      Marland Kitchen albuterol (ACCUNEB) 1.25 MG/3ML nebulizer solution Take 1 ampule by nebulization every 6 (six) hours as needed.      Marland Kitchen albuterol (PROAIR HFA) 108 (90 BASE)  MCG/ACT inhaler Inhale 2 puffs into the lungs 4 (four) times daily as needed.      . Calcium Carbonate-Vitamin D (CALCIUM-VITAMIN D) 600-200 MG-UNIT CAPS Take 1 capsule by mouth daily.      . cetirizine (ZYRTEC) 10 MG tablet Take 10 mg by mouth daily.      . Cholecalciferol (VITAMIN D3) 2000 UNITS TABS Take 1 tablet by mouth daily.      . cyclobenzaprine (FLEXERIL) 10 MG tablet Take 10 mg by mouth 3 (three) times daily as needed.      . diltiazem (CARDIZEM CD) 300 MG 24 hr capsule Take 300 mg by mouth daily.      . ENABLEX 7.5 MG 24 hr tablet Take 1 tablet by mouth daily.      . famotidine (PEPCID) 20 MG tablet Take 20 mg by mouth 2 (two) times daily as needed.       . fluticasone (FLONASE) 50 MCG/ACT nasal spray Place 1 spray into the nose 2 (two) times daily.       . Fluticasone-Salmeterol (ADVAIR) 500-50 MCG/DOSE AEPB Inhale 1 puff into the lungs every 12 (twelve) hours.      . furosemide (LASIX) 20 MG tablet Take 20 mg by mouth 2 (two) times daily.       Marland Kitchen losartan (COZAAR) 100 MG tablet Take 50 mg by mouth. 1/2 tablet at bedtime      . polyethylene glycol powder (GLYCOLAX/MIRALAX) powder Take 17 g by mouth daily.      . pravastatin (PRAVACHOL) 80 MG  tablet Take 80 mg by mouth daily.      . traMADol (ULTRAM) 50 MG tablet Take 50 mg by mouth every 6 (six) hours as needed.       . vitamin C (ASCORBIC ACID) 500 MG tablet Take 500 mg by mouth daily.      Marland Kitchen warfarin (COUMADIN) 10 MG tablet Take one tablet daily except none of Friday      . warfarin (COUMADIN) 5 MG tablet Take as directed       No current facility-administered medications for this visit.    Past Medical History  Diagnosis Date  . Obstructive sleep apnea     NPSG 05-29-07; AHI 32.9,cpap 7- AHI 0/hr weight 382  . Essential hypertension, benign   . Mixed hyperlipidemia   . Esophageal reflux   . COPD (chronic obstructive pulmonary disease)   . History of pulmonary embolism     Multiple, status post IVC filter, on Coumadin  .  History of DVT (deep vein thrombosis)   . Atrial fibrillation   . GERD (gastroesophageal reflux disease)   . Osteoarthritis   . Factor V Leiden mutation   . Allergic rhinitis     Past Surgical History  Procedure Laterality Date  . Ivc filter    . Ganglion cyst excision    . Hernia repair    . Hernia abscess    . I&d hernia incisional abscess    . Endometrial ablation    . Rotator cuff repair      Family History  Problem Relation Age of Onset  . Allergies    . Cancer Mother     Thyroid cancer  . Asthma    . Heart disease Father     Heart attack  . COPD      Social History Rhonda Acevedo reports that she has never smoked. She does not have any smokeless tobacco history on file. Rhonda Acevedo reports that she does not drink alcohol.  Review of Systems No palpitations or syncope. Chronic leg edema. No progressive cough or hemoptysis. No fevers or chills. Other systems reviewed and negative except as outlined.  Physical Examination Filed Vitals:   08/17/13 1337  BP: 130/68  Pulse: 88   Filed Weights   08/17/13 1337  Weight: 411 lb (186.428 kg)   Morbidly obese woman, appears comfortable at rest. States that she was significant for short of breath walking into the office from the parking lot however. HEENT: Conjunctiva and lids normal, oropharynx clear. Neck: Supple, no elevated JVP or carotid bruits, no thyromegaly. Lungs: Decreased breath sounds without wheezing, nonlabored breathing at rest. Cardiac: Distant, regular rate and rhythm, no S3, soft systolic murmur, no pericardial rub. Abdomen: Morbidly obese with pannus, nontender, bowel sounds present, no guarding or rebound. Extremities: Chronic appearing edema is mild with associated adipose tissue, distal pulses 2+. Skin: Warm and dry. Musculoskeletal: No kyphosis. Neuropsychiatric: Alert and oriented x3, affect grossly appropriate.   Problem List and Plan   Exertional dyspnea Rhonda Acevedo has multiple reasons to be  short of breath including morbid obesity, COPD with asthma, exertional hypoxia requiring supplemental oxygen, and a prior history of multiple pulmonary emboli status post IVC filter and on chronic Coumadin. She is fairly clear in indicating however that her symptoms have changed since May, with much more exertional shortness of breath that does not persist when she rests, and does not require rescue inhalers. She is referred by Dr. Inda Merlin with a concern for possible cardiac asthma and to exclude  underlying obstructive CAD contributing to her symptomatology. Recent echocardiogram was a limited study but showed preserved LVEF and normal diastolic parameters. PA pressures were not obtained. This presents a diagnostic difficulty particularly based on her body habitus as it relates to noninvasive testing. Nuclear stress testing would almost certainly be fraught with artifact, and not particularly helpful at this point. A dobutamine echocardiogram with Definity contrast could be considered, but she has a history of PAF on high-dose Cardizem which would need to be interrupted, and it is not entirely clear that we would obtain optimal images even with that study. Alternatively, a diagnostic heart catheterization via the radial approach is more likely to provide diagnostic information regarding her coronary anatomy. Complicating this however is a need for long-term anticoagulation in light of her history and hypercoagulable state. We have discussed the risks and benefits, and she is willing to proceed. Our plan is to coordinate Lovenox bridging while off Coumadin through our anticoagulation clinic in the short-term, and schedule this procedure as soon as is feasible. Her INR today was 4.0.  HYPERLIPIDEMIA On Pravachol, followed by Dr. Inda Merlin.  Essential hypertension, benign  On Cozaar and Cardizem CD.  Hypercoagulable state Patient has a history of factor V Leiden mutation, prior multiple DVTs and pulmonary emboli,  status post IVC filter, and on chronic Coumadin.  OBESITY HYPOVENTILATION SYNDROME Followed by Dr. Annamaria Boots.    Satira Sark, M.D., F.A.C.C.

## 2013-08-20 NOTE — Progress Notes (Signed)
RADstat removed. Pressure held for 20 minutes by Lucio Edward, RN. Site level 0. Radial pulse 3+.

## 2013-08-20 NOTE — Discharge Instructions (Signed)
Radial Site Care °Refer to this sheet in the next few weeks. These instructions provide you with information on caring for yourself after your procedure. Your caregiver may also give you more specific instructions. Your treatment has been planned according to current medical practices, but problems sometimes occur. Call your caregiver if you have any problems or questions after your procedure. °HOME CARE INSTRUCTIONS °· You may shower the day after the procedure. Remove the bandage (dressing) and gently wash the site with plain soap and water. Gently pat the site dry. °· Do not apply powder or lotion to the site. °· Do not submerge the affected site in water for 3 to 5 days. °· Inspect the site at least twice daily. °· Do not flex or bend the affected arm for 24 hours. °· No lifting over 5 pounds (2.3 kg) for 5 days after your procedure. °· Do not drive home if you are discharged the same day of the procedure. Have someone else drive you. °· You may drive 24 hours after the procedure unless otherwise instructed by your caregiver. °· Do not operate machinery or power tools for 24 hours. °· A responsible adult should be with you for the first 24 hours after you arrive home. °What to expect: °· Any bruising will usually fade within 1 to 2 weeks. °· Blood that collects in the tissue (hematoma) may be painful to the touch. It should usually decrease in size and tenderness within 1 to 2 weeks. °SEEK IMMEDIATE MEDICAL CARE IF: °· You have unusual pain at the radial site. °· You have redness, warmth, swelling, or pain at the radial site. °· You have drainage (other than a small amount of blood on the dressing). °· You have chills. °· You have a fever or persistent symptoms for more than 72 hours. °· You have a fever and your symptoms suddenly get worse. °· Your arm becomes pale, cool, tingly, or numb. °· You have heavy bleeding from the site. Hold pressure on the site. °Document Released: 02/09/2010 Document Revised:  04/01/2011 Document Reviewed: 02/09/2010 °ExitCare® Patient Information ©2015 [employer]. This information is not intended to replace advice given to you by your health care provider. Make sure you discuss any questions you have with your health care provider. ° °

## 2013-08-23 ENCOUNTER — Ambulatory Visit (INDEPENDENT_AMBULATORY_CARE_PROVIDER_SITE_OTHER): Payer: Commercial Managed Care - HMO | Admitting: *Deleted

## 2013-08-23 DIAGNOSIS — D6859 Other primary thrombophilia: Secondary | ICD-10-CM | POA: Diagnosis not present

## 2013-08-23 DIAGNOSIS — Z5181 Encounter for therapeutic drug level monitoring: Secondary | ICD-10-CM | POA: Diagnosis not present

## 2013-08-23 DIAGNOSIS — I2699 Other pulmonary embolism without acute cor pulmonale: Secondary | ICD-10-CM | POA: Diagnosis not present

## 2013-08-23 DIAGNOSIS — I82409 Acute embolism and thrombosis of unspecified deep veins of unspecified lower extremity: Secondary | ICD-10-CM

## 2013-08-23 LAB — POCT INR: INR: 1.4

## 2013-08-26 ENCOUNTER — Inpatient Hospital Stay (HOSPITAL_COMMUNITY)
Admission: EM | Admit: 2013-08-26 | Discharge: 2013-08-28 | DRG: 176 | Disposition: A | Discharge status: Home / Self Care | Payer: Medicare HMO | Attending: Internal Medicine | Admitting: Internal Medicine

## 2013-08-26 ENCOUNTER — Encounter (HOSPITAL_COMMUNITY): Payer: Self-pay | Admitting: Emergency Medicine

## 2013-08-26 ENCOUNTER — Ambulatory Visit (HOSPITAL_COMMUNITY)
Admission: RE | Admit: 2013-08-26 | Discharge: 2013-08-26 | Disposition: A | Discharge status: Home / Self Care | Payer: Medicare HMO | Source: Ambulatory Visit | Attending: Internal Medicine | Admitting: Internal Medicine

## 2013-08-26 ENCOUNTER — Other Ambulatory Visit: Payer: Self-pay

## 2013-08-26 ENCOUNTER — Encounter (HOSPITAL_COMMUNITY): Payer: Self-pay

## 2013-08-26 ENCOUNTER — Other Ambulatory Visit (HOSPITAL_COMMUNITY): Payer: Self-pay | Admitting: Internal Medicine

## 2013-08-26 DIAGNOSIS — R0600 Dyspnea, unspecified: Secondary | ICD-10-CM

## 2013-08-26 DIAGNOSIS — J4489 Other specified chronic obstructive pulmonary disease: Secondary | ICD-10-CM | POA: Diagnosis present

## 2013-08-26 DIAGNOSIS — D6859 Other primary thrombophilia: Secondary | ICD-10-CM | POA: Diagnosis present

## 2013-08-26 DIAGNOSIS — J9611 Chronic respiratory failure with hypoxia: Secondary | ICD-10-CM

## 2013-08-26 DIAGNOSIS — R0609 Other forms of dyspnea: Principal | ICD-10-CM

## 2013-08-26 DIAGNOSIS — M1990 Unspecified osteoarthritis, unspecified site: Secondary | ICD-10-CM | POA: Diagnosis present

## 2013-08-26 DIAGNOSIS — J449 Chronic obstructive pulmonary disease, unspecified: Secondary | ICD-10-CM | POA: Diagnosis present

## 2013-08-26 DIAGNOSIS — E782 Mixed hyperlipidemia: Secondary | ICD-10-CM | POA: Diagnosis present

## 2013-08-26 DIAGNOSIS — J309 Allergic rhinitis, unspecified: Secondary | ICD-10-CM | POA: Diagnosis present

## 2013-08-26 DIAGNOSIS — L02219 Cutaneous abscess of trunk, unspecified: Secondary | ICD-10-CM | POA: Diagnosis present

## 2013-08-26 DIAGNOSIS — I10 Essential (primary) hypertension: Secondary | ICD-10-CM | POA: Diagnosis present

## 2013-08-26 DIAGNOSIS — Z86711 Personal history of pulmonary embolism: Secondary | ICD-10-CM

## 2013-08-26 DIAGNOSIS — Z6841 Body Mass Index (BMI) 40.0 and over, adult: Secondary | ICD-10-CM

## 2013-08-26 DIAGNOSIS — I2699 Other pulmonary embolism without acute cor pulmonale: Principal | ICD-10-CM | POA: Diagnosis present

## 2013-08-26 DIAGNOSIS — J9610 Chronic respiratory failure, unspecified whether with hypoxia or hypercapnia: Secondary | ICD-10-CM | POA: Diagnosis present

## 2013-08-26 DIAGNOSIS — Z86718 Personal history of other venous thrombosis and embolism: Secondary | ICD-10-CM

## 2013-08-26 DIAGNOSIS — Z7901 Long term (current) use of anticoagulants: Secondary | ICD-10-CM

## 2013-08-26 DIAGNOSIS — I4891 Unspecified atrial fibrillation: Secondary | ICD-10-CM | POA: Diagnosis present

## 2013-08-26 DIAGNOSIS — K219 Gastro-esophageal reflux disease without esophagitis: Secondary | ICD-10-CM | POA: Diagnosis present

## 2013-08-26 DIAGNOSIS — R0902 Hypoxemia: Secondary | ICD-10-CM | POA: Diagnosis present

## 2013-08-26 DIAGNOSIS — G4733 Obstructive sleep apnea (adult) (pediatric): Secondary | ICD-10-CM | POA: Diagnosis present

## 2013-08-26 DIAGNOSIS — L03319 Cellulitis of trunk, unspecified: Secondary | ICD-10-CM

## 2013-08-26 DIAGNOSIS — E662 Morbid (severe) obesity with alveolar hypoventilation: Secondary | ICD-10-CM | POA: Diagnosis present

## 2013-08-26 DIAGNOSIS — Z79899 Other long term (current) drug therapy: Secondary | ICD-10-CM

## 2013-08-26 LAB — CBC WITH DIFFERENTIAL/PLATELET
BASOS PCT: 1 % (ref 0–1)
Basophils Absolute: 0 10*3/uL (ref 0.0–0.1)
Eosinophils Absolute: 0.1 10*3/uL (ref 0.0–0.7)
Eosinophils Relative: 1 % (ref 0–5)
HCT: 38.1 % (ref 36.0–46.0)
HEMOGLOBIN: 12 g/dL (ref 12.0–15.0)
Lymphocytes Relative: 30 % (ref 12–46)
Lymphs Abs: 2 10*3/uL (ref 0.7–4.0)
MCH: 30.1 pg (ref 26.0–34.0)
MCHC: 31.5 g/dL (ref 30.0–36.0)
MCV: 95.5 fL (ref 78.0–100.0)
MONOS PCT: 9 % (ref 3–12)
Monocytes Absolute: 0.6 10*3/uL (ref 0.1–1.0)
Neutro Abs: 4 10*3/uL (ref 1.7–7.7)
Neutrophils Relative %: 59 % (ref 43–77)
Platelets: 276 10*3/uL (ref 150–400)
RBC: 3.99 MIL/uL (ref 3.87–5.11)
RDW: 13.8 % (ref 11.5–15.5)
WBC: 6.7 10*3/uL (ref 4.0–10.5)

## 2013-08-26 MED ORDER — IOHEXOL 350 MG/ML SOLN
100.0000 mL | Freq: Once | INTRAVENOUS | Status: AC | PRN
Start: 2013-08-26 — End: 2013-08-26
  Administered 2013-08-26: 200 mL via INTRAVENOUS

## 2013-08-26 NOTE — ED Notes (Signed)
Pt states- her MD sent her here for rule out of PE for occasional chest discomfort (with ambulation) and difficulty breathing. Pt wears 3L O2 via [place] at home. Pt states PCP drew a PT INR but that was it. No chest pain at present, no acute distress. Breath sounds clear but diminished.

## 2013-08-26 NOTE — ED Notes (Addendum)
Patient here from home. Has been experiencing increased shortness of breath for about 3 months. Was sent here by PCP for CT scan to evaluate for PE. PE study was completed and results were not definitive but suspicious per Radiology report and patient. Reports that her symptoms are stable at this time. Reports no chest pain. Patient currently has an IVC filter and is on coumadin. INR was measured today by PCP at 3.1.

## 2013-08-27 ENCOUNTER — Inpatient Hospital Stay (HOSPITAL_COMMUNITY): Payer: Medicare HMO

## 2013-08-27 DIAGNOSIS — Z86718 Personal history of other venous thrombosis and embolism: Secondary | ICD-10-CM | POA: Diagnosis not present

## 2013-08-27 DIAGNOSIS — R0602 Shortness of breath: Secondary | ICD-10-CM

## 2013-08-27 DIAGNOSIS — Z6841 Body Mass Index (BMI) 40.0 and over, adult: Secondary | ICD-10-CM | POA: Diagnosis not present

## 2013-08-27 DIAGNOSIS — M1990 Unspecified osteoarthritis, unspecified site: Secondary | ICD-10-CM | POA: Diagnosis present

## 2013-08-27 DIAGNOSIS — I2699 Other pulmonary embolism without acute cor pulmonale: Secondary | ICD-10-CM | POA: Diagnosis present

## 2013-08-27 DIAGNOSIS — G4733 Obstructive sleep apnea (adult) (pediatric): Secondary | ICD-10-CM

## 2013-08-27 DIAGNOSIS — R0902 Hypoxemia: Secondary | ICD-10-CM | POA: Diagnosis present

## 2013-08-27 DIAGNOSIS — K219 Gastro-esophageal reflux disease without esophagitis: Secondary | ICD-10-CM

## 2013-08-27 DIAGNOSIS — I10 Essential (primary) hypertension: Secondary | ICD-10-CM | POA: Diagnosis present

## 2013-08-27 DIAGNOSIS — J449 Chronic obstructive pulmonary disease, unspecified: Secondary | ICD-10-CM | POA: Diagnosis present

## 2013-08-27 DIAGNOSIS — L02219 Cutaneous abscess of trunk, unspecified: Secondary | ICD-10-CM | POA: Diagnosis present

## 2013-08-27 DIAGNOSIS — Z79899 Other long term (current) drug therapy: Secondary | ICD-10-CM | POA: Diagnosis not present

## 2013-08-27 DIAGNOSIS — D6859 Other primary thrombophilia: Secondary | ICD-10-CM | POA: Diagnosis present

## 2013-08-27 DIAGNOSIS — R0609 Other forms of dyspnea: Secondary | ICD-10-CM

## 2013-08-27 DIAGNOSIS — I369 Nonrheumatic tricuspid valve disorder, unspecified: Secondary | ICD-10-CM

## 2013-08-27 DIAGNOSIS — E782 Mixed hyperlipidemia: Secondary | ICD-10-CM | POA: Diagnosis present

## 2013-08-27 DIAGNOSIS — E662 Morbid (severe) obesity with alveolar hypoventilation: Secondary | ICD-10-CM | POA: Diagnosis present

## 2013-08-27 DIAGNOSIS — Z7901 Long term (current) use of anticoagulants: Secondary | ICD-10-CM | POA: Diagnosis not present

## 2013-08-27 DIAGNOSIS — Z86711 Personal history of pulmonary embolism: Secondary | ICD-10-CM | POA: Diagnosis not present

## 2013-08-27 DIAGNOSIS — R0989 Other specified symptoms and signs involving the circulatory and respiratory systems: Secondary | ICD-10-CM

## 2013-08-27 DIAGNOSIS — J309 Allergic rhinitis, unspecified: Secondary | ICD-10-CM | POA: Diagnosis present

## 2013-08-27 DIAGNOSIS — J9610 Chronic respiratory failure, unspecified whether with hypoxia or hypercapnia: Secondary | ICD-10-CM | POA: Diagnosis present

## 2013-08-27 DIAGNOSIS — I4891 Unspecified atrial fibrillation: Secondary | ICD-10-CM | POA: Diagnosis present

## 2013-08-27 LAB — TROPONIN I

## 2013-08-27 LAB — BASIC METABOLIC PANEL
ANION GAP: 14 (ref 5–15)
BUN: 14 mg/dL (ref 6–23)
CO2: 26 mEq/L (ref 19–32)
Calcium: 9.3 mg/dL (ref 8.4–10.5)
Chloride: 99 mEq/L (ref 96–112)
Creatinine, Ser: 0.98 mg/dL (ref 0.50–1.10)
GFR calc Af Amer: 71 mL/min — ABNORMAL LOW (ref >90)
GFR, EST NON AFRICAN AMERICAN: 61 mL/min — AB (ref >90)
GLUCOSE: 108 mg/dL — AB (ref 70–99)
POTASSIUM: 3.9 meq/L (ref 3.7–5.3)
Sodium: 139 mEq/L (ref 137–147)

## 2013-08-27 LAB — COMPREHENSIVE METABOLIC PANEL
ALBUMIN: 3.5 g/dL (ref 3.5–5.2)
ALK PHOS: 71 U/L (ref 39–117)
ALT: 26 U/L (ref 0–35)
ANION GAP: 16 — AB (ref 5–15)
AST: 22 U/L (ref 0–37)
BUN: 15 mg/dL (ref 6–23)
CO2: 23 mEq/L (ref 19–32)
CREATININE: 1.05 mg/dL (ref 0.50–1.10)
Calcium: 9.2 mg/dL (ref 8.4–10.5)
Chloride: 100 mEq/L (ref 96–112)
GFR calc Af Amer: 66 mL/min — ABNORMAL LOW (ref >90)
GFR calc non Af Amer: 57 mL/min — ABNORMAL LOW (ref >90)
Glucose, Bld: 110 mg/dL — ABNORMAL HIGH (ref 70–99)
POTASSIUM: 3.9 meq/L (ref 3.7–5.3)
Sodium: 139 mEq/L (ref 137–147)
TOTAL PROTEIN: 6.4 g/dL (ref 6.0–8.3)
Total Bilirubin: 0.2 mg/dL — ABNORMAL LOW (ref 0.3–1.2)

## 2013-08-27 LAB — APTT: aPTT: 56 seconds — ABNORMAL HIGH (ref 24–37)

## 2013-08-27 LAB — CBC
HEMATOCRIT: 36.5 % (ref 36.0–46.0)
HEMOGLOBIN: 11.7 g/dL — AB (ref 12.0–15.0)
MCH: 30.4 pg (ref 26.0–34.0)
MCHC: 32.1 g/dL (ref 30.0–36.0)
MCV: 94.8 fL (ref 78.0–100.0)
Platelets: 265 10*3/uL (ref 150–400)
RBC: 3.85 MIL/uL — ABNORMAL LOW (ref 3.87–5.11)
RDW: 13.9 % (ref 11.5–15.5)
WBC: 6.7 10*3/uL (ref 4.0–10.5)

## 2013-08-27 LAB — PRO B NATRIURETIC PEPTIDE: Pro B Natriuretic peptide (BNP): 329.4 pg/mL — ABNORMAL HIGH (ref 0–125)

## 2013-08-27 LAB — PROTIME-INR
INR: 2.31 — AB (ref 0.00–1.49)
INR: 2.04 — ABNORMAL HIGH (ref 0.00–1.49)
Prothrombin Time: 25.4 seconds — ABNORMAL HIGH (ref 11.6–15.2)
Prothrombin Time: 23 seconds — ABNORMAL HIGH (ref 11.6–15.2)

## 2013-08-27 MED ORDER — MOMETASONE FURO-FORMOTEROL FUM 200-5 MCG/ACT IN AERO
2.0000 | INHALATION_SPRAY | Freq: Two times a day (BID) | RESPIRATORY_TRACT | Status: DC
Start: 2013-08-27 — End: 2013-08-28
  Administered 2013-08-27 – 2013-08-28 (×3): 2 via RESPIRATORY_TRACT
  Filled 2013-08-27: qty 8.8

## 2013-08-27 MED ORDER — TECHNETIUM TC 99M DIETHYLENETRIAME-PENTAACETIC ACID
40.0000 | Freq: Once | INTRAVENOUS | Status: AC | PRN
  Administered 2013-08-27: 40 via RESPIRATORY_TRACT

## 2013-08-27 MED ORDER — VANCOMYCIN HCL 10 G IV SOLR
1500.0000 mg | Freq: Two times a day (BID) | INTRAVENOUS | Status: DC
  Administered 2013-08-27 – 2013-08-28 (×3): 1500 mg via INTRAVENOUS
  Filled 2013-08-27 (×4): qty 1500

## 2013-08-27 MED ORDER — ALBUTEROL SULFATE (2.5 MG/3ML) 0.083% IN NEBU: 2.5000 mg | INHALATION_SOLUTION | Freq: Four times a day (QID) | RESPIRATORY_TRACT | Status: DC | PRN

## 2013-08-27 MED ORDER — FAMOTIDINE 40 MG PO TABS
40.0000 mg | ORAL_TABLET | Freq: Two times a day (BID) | ORAL | Status: DC
  Administered 2013-08-27 – 2013-08-28 (×3): 40 mg via ORAL
  Filled 2013-08-27 (×5): qty 1

## 2013-08-27 MED ORDER — ACETAMINOPHEN 650 MG RE SUPP: 650.0000 mg | Freq: Four times a day (QID) | RECTAL | Status: DC | PRN

## 2013-08-27 MED ORDER — TRAMADOL HCL 50 MG PO TABS
50.0000 mg | ORAL_TABLET | Freq: Four times a day (QID) | ORAL | Status: DC | PRN
Start: 2013-08-27 — End: 2013-08-28

## 2013-08-27 MED ORDER — PRAVASTATIN SODIUM 40 MG PO TABS
80.0000 mg | ORAL_TABLET | Freq: Every day | ORAL | Status: DC
  Administered 2013-08-27 – 2013-08-28 (×2): 80 mg via ORAL
  Filled 2013-08-27 (×2): qty 2

## 2013-08-27 MED ORDER — POLYETHYLENE GLYCOL 3350 17 G PO PACK
17.0000 g | PACK | Freq: Every day | ORAL | Status: DC
  Filled 2013-08-27 (×3): qty 1

## 2013-08-27 MED ORDER — FLUTICASONE PROPIONATE HFA 44 MCG/ACT IN AERO
2.0000 | INHALATION_SPRAY | Freq: Every day | RESPIRATORY_TRACT | Status: DC
  Filled 2013-08-27: qty 10.6

## 2013-08-27 MED ORDER — ACETAMINOPHEN 325 MG PO TABS: 650.0000 mg | ORAL_TABLET | Freq: Four times a day (QID) | ORAL | Status: DC | PRN

## 2013-08-27 MED ORDER — VANCOMYCIN HCL 10 G IV SOLR
2000.0000 mg | Freq: Once | INTRAVENOUS | Status: AC
  Administered 2013-08-27: 2000 mg via INTRAVENOUS
  Filled 2013-08-27: qty 2000

## 2013-08-27 MED ORDER — ENOXAPARIN SODIUM 150 MG/ML ~~LOC~~ SOLN
180.0000 mg | Freq: Two times a day (BID) | SUBCUTANEOUS | Status: DC
  Administered 2013-08-27 – 2013-08-28 (×2): 180 mg via SUBCUTANEOUS
  Filled 2013-08-27 (×5): qty 2

## 2013-08-27 MED ORDER — WARFARIN SODIUM 10 MG PO TABS: 10.0000 mg | ORAL_TABLET | Freq: Every day | ORAL | Status: DC

## 2013-08-27 MED ORDER — FUROSEMIDE 20 MG PO TABS
20.0000 mg | ORAL_TABLET | Freq: Two times a day (BID) | ORAL | Status: DC
  Administered 2013-08-27 – 2013-08-28 (×3): 20 mg via ORAL
  Filled 2013-08-27 (×6): qty 1

## 2013-08-27 MED ORDER — TECHNETIUM TO 99M ALBUMIN AGGREGATED
6.0000 | Freq: Once | INTRAVENOUS | Status: AC | PRN
  Administered 2013-08-27: 6 via INTRAVENOUS

## 2013-08-27 MED ORDER — OXYBUTYNIN CHLORIDE 5 MG PO TABS
5.0000 mg | ORAL_TABLET | Freq: Two times a day (BID) | ORAL | Status: DC
  Administered 2013-08-27 – 2013-08-28 (×3): 5 mg via ORAL
  Filled 2013-08-27 (×4): qty 1

## 2013-08-27 MED ORDER — FLUTICASONE PROPIONATE 50 MCG/ACT NA SUSP
1.0000 | Freq: Two times a day (BID) | NASAL | Status: DC
  Administered 2013-08-27 – 2013-08-28 (×3): 1 via NASAL
  Filled 2013-08-27: qty 16

## 2013-08-27 MED ORDER — ALBUTEROL SULFATE 1.25 MG/3ML IN NEBU: 1.0000 | INHALATION_SOLUTION | Freq: Four times a day (QID) | RESPIRATORY_TRACT | Status: DC | PRN

## 2013-08-27 MED ORDER — ENOXAPARIN SODIUM 150 MG/ML ~~LOC~~ SOLN
180.0000 mg | Freq: Once | SUBCUTANEOUS | Status: AC
  Administered 2013-08-27: 180 mg via SUBCUTANEOUS
  Filled 2013-08-27 (×2): qty 2

## 2013-08-27 MED ORDER — SODIUM CHLORIDE 0.9 % IJ SOLN
3.0000 mL | Freq: Two times a day (BID) | INTRAMUSCULAR | Status: DC
  Administered 2013-08-27 – 2013-08-28 (×4): 3 mL via INTRAVENOUS

## 2013-08-27 MED ORDER — LOSARTAN POTASSIUM 50 MG PO TABS
50.0000 mg | ORAL_TABLET | Freq: Every day | ORAL | Status: DC
  Administered 2013-08-27: 50 mg via ORAL
  Filled 2013-08-27 (×2): qty 1

## 2013-08-27 MED ORDER — SIMVASTATIN 40 MG PO TABS
40.0000 mg | ORAL_TABLET | Freq: Every day | ORAL | Status: DC
  Filled 2013-08-27: qty 1

## 2013-08-27 MED ORDER — DILTIAZEM HCL ER COATED BEADS 300 MG PO CP24
300.0000 mg | ORAL_CAPSULE | Freq: Every day | ORAL | Status: DC
  Administered 2013-08-27 – 2013-08-28 (×2): 300 mg via ORAL
  Filled 2013-08-27 (×2): qty 1

## 2013-08-27 MED ORDER — ONDANSETRON HCL 4 MG/2ML IJ SOLN: 4.0000 mg | Freq: Four times a day (QID) | INTRAMUSCULAR | Status: DC | PRN

## 2013-08-27 MED ORDER — ONDANSETRON HCL 4 MG PO TABS: 4.0000 mg | ORAL_TABLET | Freq: Four times a day (QID) | ORAL | Status: DC | PRN

## 2013-08-27 NOTE — Progress Notes (Signed)
Patient admitted earlier today. 63 year old lady with h/o PE, DVT admitted for sob. Repeat NM scan does not show any PE and echocardiogram does not show any right heart strain. Continue to monitor.   Hosie Poisson, MD  [phone]

## 2013-08-27 NOTE — Progress Notes (Signed)
Echo Lab  Limited 2D Echocardiogram completed.  [place], RDCS 08/27/2013 10:58 AM

## 2013-08-27 NOTE — Care Management Note (Unsigned)
    Page 1 of 2   08/27/2013     3:22:26 PM CARE MANAGEMENT NOTE 08/27/2013  Patient:  Rhonda Acevedo, Rhonda Acevedo   Account Number:  [id number]  Date Initiated:  08/27/2013  Documentation initiated by:  Giovan Pinsky  Subjective/Objective Assessment:   Pt adm on 08/26/13 with PE.  PTA, pt from home and independent of most ADLS.     Action/Plan:   Will check insurance coverage for Xarelto and Eliquis, per MD request.  Will follow for dc planning.   Anticipated DC Date:  08/29/2013   Anticipated DC Plan:  [place]  CM consult      Choice offered to / List presented to:             Status of service:  In process, will continue to follow Medicare Important Message given?   (If response is "NO", the following Medicare IM given date fields will be blank) Date Medicare IM given:   Medicare IM given by:   Date Additional Medicare IM given:   Additional Medicare IM given by:    Discharge Disposition:    Per UR Regulation:  Reviewed for med. necessity/level of care/duration of stay  If discussed at [place] of Stay Meetings, dates discussed:    Comments:  08/27/13 Ellan Lambert, RN, BSN [phone]  S[facility] @  [place] # [phone]  COVER-YES  Alveda Reasons 15MG  BID X 21 DAYS CO-PAY $ 100.00  XARELTO 20 MG  DAILY  CO-PAY- $ 50.24  PRIOR APPROVAL -NO PHARMACY : ANY STANDARD PHARMACY  * ELIQUIS  10 MG X 7 DAYS -NO ELIQUIS 5 MG  BID 30 DAYS CO-PAY _ 142.18 PRIOR APPROVAL - NO PHARMACY - ANY STANDARD   ( MAIL ORDER - 7 10 DAYS  )

## 2013-08-27 NOTE — Progress Notes (Signed)
ANTIBIOTIC CONSULT NOTE - INITIAL  Pharmacy Consult for Vancomycin  Indication: Cellulitis  Allergies  Allergen Reactions  . Adhesive [Tape] Itching and Rash  . Benazepril Hcl Cough    Patient Measurements: Height: 5\' 6"  (167.6 cm) Weight: 402 lb (182.346 kg) IBW/kg (Calculated) : 59.3   Vital Signs: Temp: 99.1 F (37.3 C) (08/07 0227) Temp src: Oral (08/07 0227) BP: 145/69 mmHg (08/07 0227) Pulse Rate: 85 (08/07 0227)  Labs:  Recent Labs  08/26/13 2322  WBC 6.7  HGB 12.0  PLT 276  CREATININE 0.98   Medical History: Past Medical History  Diagnosis Date  . Obstructive sleep apnea     NPSG 05-29-07; AHI 32.9,cpap 7- AHI 0/hr weight 382  . Essential hypertension, benign   . Mixed hyperlipidemia   . Esophageal reflux   . COPD (chronic obstructive pulmonary disease)   . History of pulmonary embolism     Multiple, status post IVC filter, on Coumadin  . History of DVT (deep vein thrombosis)   . Atrial fibrillation   . GERD (gastroesophageal reflux disease)   . Osteoarthritis   . Factor V Leiden mutation   . Allergic rhinitis     Assessment: 63 y/o morbidly obese female to start vancomycin for cellulitis. WBC 6.7, renal function ok, other labs as above.   Goal of Therapy:  Vancomycin trough level 10-15 mcg/ml  Plan:  -Vancomycin 2000 mg IV x 1, then 1500 mg IV q12h -Check VT ASAP given weight -Trend WBC, temp, renal function   Narda Bonds 08/27/2013,3:22 AM

## 2013-08-27 NOTE — ED Provider Notes (Signed)
CSN: [id number]     Arrival date & time 08/26/13  2038 History   First MD Initiated Contact with Patient 08/27/13 0021     Chief Complaint  Patient presents with  . Shortness of Breath  . Abnormal Lab     (Consider location/radiation/quality/duration/timing/severity/associated sxs/prior Treatment) Patient is a 63 y.o. female presenting with shortness of breath. The history is provided by the patient.  Shortness of Breath Associated symptoms: no abdominal pain, no chest pain, no cough, no fever, no headaches, no neck pain, no rash, no sore throat and no vomiting   pt with hx PE, morbid obesity, asthma, c/o sob x 3 months. Dyspnea w minimal exertion.  Improves w rest. Not responsive to mdi use.  Had recent eval for same including cardiac cath 1 week ago w normal coronaries. Today had ct concerning for bil PE, so sent to ED.  No acute or abrupt change in sob today or this week. No pnd. No increased leg edema or pain. Is on coumadin, has ivc filter.  Symptoms constant, persistent since onset.     Past Medical History  Diagnosis Date  . Obstructive sleep apnea     NPSG 05-29-07; AHI 32.9,cpap 7- AHI 0/hr weight 382  . Essential hypertension, benign   . Mixed hyperlipidemia   . Esophageal reflux   . COPD (chronic obstructive pulmonary disease)   . History of pulmonary embolism     Multiple, status post IVC filter, on Coumadin  . History of DVT (deep vein thrombosis)   . Atrial fibrillation   . GERD (gastroesophageal reflux disease)   . Osteoarthritis   . Factor V Leiden mutation   . Allergic rhinitis    Past Surgical History  Procedure Laterality Date  . Ivc filter    . Ganglion cyst excision    . Hernia repair    . Hernia abscess    . I&d hernia incisional abscess    . Endometrial ablation    . Rotator cuff repair     Family History  Problem Relation Age of Onset  . Allergies    . Cancer Mother     Thyroid cancer  . Asthma    . Heart disease Father     Heart attack  .  COPD     History  Substance Use Topics  . Smoking status: Never Smoker   . Smokeless tobacco: Not on file     Comment: only as a teen for 6 months  . Alcohol Use: No   OB History   Grav Para Term Preterm Abortions TAB SAB Ect Mult Living                 Review of Systems  Constitutional: Negative for fever and chills.  HENT: Negative for sore throat.   Eyes: Negative for redness.  Respiratory: Positive for shortness of breath. Negative for cough.   Cardiovascular: Negative for chest pain.  Gastrointestinal: Positive for diarrhea. Negative for vomiting and abdominal pain.  Genitourinary: Negative for flank pain.  Musculoskeletal: Negative for back pain and neck pain.  Skin: Negative for rash.  Neurological: Negative for headaches.  Hematological: Does not bruise/bleed easily.  Psychiatric/Behavioral: Negative for confusion.      Allergies  Adhesive and Benazepril hcl  Home Medications   Prior to Admission medications   Medication Sig Start Date End Date Taking? Authorizing Provider  Acetaminophen (TYLENOL ARTHRITIS PAIN PO) Take 2 tablets by mouth 2 (two) times daily.    Yes Historical Provider, MD  albuterol (ACCUNEB) 1.25 MG/3ML nebulizer solution Take 1 ampule by nebulization every 6 (six) hours as needed for wheezing or shortness of breath.    Yes Historical Provider, MD  albuterol (PROAIR HFA) 108 (90 BASE) MCG/ACT inhaler Inhale 2 puffs into the lungs 4 (four) times daily as needed for wheezing or shortness of breath.    Yes Historical Provider, MD  Calcium Carbonate-Vitamin D (CALCIUM-VITAMIN D) 600-200 MG-UNIT CAPS Take 1 capsule by mouth daily.   Yes Historical Provider, MD  cetirizine (ZYRTEC) 10 MG tablet Take 10 mg by mouth at bedtime.    Yes Historical Provider, MD  Cholecalciferol (VITAMIN D3) 2000 UNITS TABS Take 2,000 Units by mouth daily.    Yes Historical Provider, MD  diltiazem (CARDIZEM CD) 300 MG 24 hr capsule Take 300 mg by mouth daily.   Yes  Historical Provider, MD  famotidine (PEPCID) 20 MG tablet Take 40 mg by mouth 2 (two) times daily.    Yes Historical Provider, MD  fluticasone (FLONASE) 50 MCG/ACT nasal spray Place 1 spray into the nose 2 (two) times daily.    Yes Historical Provider, MD  fluticasone (FLOVENT HFA) 44 MCG/ACT inhaler Inhale 2 puffs into the lungs daily.   Yes Historical Provider, MD  Fluticasone-Salmeterol (ADVAIR) 500-50 MCG/DOSE AEPB Inhale 1 puff into the lungs every 12 (twelve) hours.   Yes Historical Provider, MD  furosemide (LASIX) 20 MG tablet Take 20 mg by mouth 2 (two) times daily.    Yes Historical Provider, MD  losartan (COZAAR) 100 MG tablet Take 50 mg by mouth at bedtime.  09/18/12  Yes Historical Provider, MD  oxybutynin (DITROPAN) 5 MG tablet Take 5 mg by mouth 2 (two) times daily.   Yes Historical Provider, MD  OXYGEN-HELIUM IN Inhale 3 L into the lungs continuous.   Yes Historical Provider, MD  polyethylene glycol powder (GLYCOLAX/MIRALAX) powder Take 17 g by mouth daily. 11/04/11  Yes Historical Provider, MD  pravastatin (PRAVACHOL) 80 MG tablet Take 80 mg by mouth at bedtime.    Yes Historical Provider, MD  traMADol (ULTRAM) 50 MG tablet Take 50 mg by mouth every 6 (six) hours as needed for moderate pain.    Yes Historical Provider, MD  vitamin C (ASCORBIC ACID) 500 MG tablet Take 500 mg by mouth daily.   Yes Historical Provider, MD  warfarin (COUMADIN) 10 MG tablet Take 10 mg by mouth daily at 6 PM. Take one tablet daily except none of Friday   Yes Historical Provider, MD  enoxaparin (LOVENOX) 150 MG/ML injection Inject 150 mg into the skin every 12 (twelve) hours.    Historical Provider, MD   BP 154/44  Pulse 79  Temp(Src) 98.1 F (36.7 C) (Oral)  Resp 21  Ht 5\' 6"  (1.676 m)  Wt 402 lb (182.346 kg)  BMI 64.92 kg/m2  SpO2 95% Physical Exam  Nursing note and vitals reviewed. Constitutional: She is oriented to person, place, and time. She appears well-developed and well-nourished. No  distress.  HENT:  Mouth/Throat: Oropharynx is clear and moist.  Eyes: Conjunctivae are normal. No scleral icterus.  Neck: Neck supple. No tracheal deviation present.  Cardiovascular: Normal rate, regular rhythm, normal heart sounds and intact distal pulses.   Pulmonary/Chest: Effort normal and breath sounds normal. No respiratory distress.  Abdominal: Soft. Normal appearance and bowel sounds are normal. She exhibits no distension. There is no tenderness.  Morbidly obese  Musculoskeletal:  Symmetric bil lower leg edema,  Neurological: She is alert and oriented to person, place,  and time.  Skin: Skin is warm and dry. No rash noted.  Psychiatric: She has a normal mood and affect.    ED Course  Procedures (including critical care time) Labs Review   Results for orders placed during the hospital encounter of 08/26/13  CBC WITH DIFFERENTIAL      Result Value Ref Range   WBC 6.7  4.0 - 10.5 K/uL   RBC 3.99  3.87 - 5.11 MIL/uL   Hemoglobin 12.0  12.0 - 15.0 g/dL   HCT 38.1  36.0 - 46.0 %   MCV 95.5  78.0 - 100.0 fL   MCH 30.1  26.0 - 34.0 pg   MCHC 31.5  30.0 - 36.0 g/dL   RDW 13.8  11.5 - 15.5 %   Platelets 276  150 - 400 K/uL   Neutrophils Relative % 59  43 - 77 %   Neutro Abs 4.0  1.7 - 7.7 K/uL   Lymphocytes Relative 30  12 - 46 %   Lymphs Abs 2.0  0.7 - 4.0 K/uL   Monocytes Relative 9  3 - 12 %   Monocytes Absolute 0.6  0.1 - 1.0 K/uL   Eosinophils Relative 1  0 - 5 %   Eosinophils Absolute 0.1  0.0 - 0.7 K/uL   Basophils Relative 1  0 - 1 %   Basophils Absolute 0.0  0.0 - 0.1 K/uL  PRO B NATRIURETIC PEPTIDE      Result Value Ref Range   Pro B Natriuretic peptide (BNP) 329.4 (*) 0 - 125 pg/mL  BASIC METABOLIC PANEL      Result Value Ref Range   Sodium 139  137 - 147 mEq/L   Potassium 3.9  3.7 - 5.3 mEq/L   Chloride 99  96 - 112 mEq/L   CO2 26  19 - 32 mEq/L   Glucose, Bld 108 (*) 70 - 99 mg/dL   BUN 14  6 - 23 mg/dL   Creatinine, Ser 0.98  0.50 - 1.10 mg/dL    Calcium 9.3  8.4 - 10.5 mg/dL   GFR calc non Af Amer 61 (*) >90 mL/min   GFR calc Af Amer 71 (*) >90 mL/min   Anion gap 14  5 - 15  TROPONIN I      Result Value Ref Range   Troponin I <0.30  <0.30 ng/mL   Dg Chest 2 View  08/17/2013   CLINICAL DATA:  Shortness of breath, pre cardiac catheterization  EXAM: CHEST  2 VIEW  COMPARISON:  03/13/2010  FINDINGS: Cardiac shadow is mildly enlarged. The lungs are clear bilaterally. No acute bony abnormality is seen.  IMPRESSION: No active cardiopulmonary disease.   Electronically Signed   By: Inez Catalina M.D.   On: 08/17/2013 16:19   Ct Angio Chest Pe W/cm &/or Wo Cm  08/26/2013   ADDENDUM REPORT: 08/26/2013 20:25  ADDENDUM: IV contrast bolus was repeated to further evaluate the pulmonary arteries. Again due to the patient's size and due to streak artifact this is a very suboptimal exam. Bilateral pulmonary emboli cannot be excluded.   Electronically Signed   By: Marcello Moores  Register   On: 08/26/2013 20:25   08/26/2013   CLINICAL DATA:  Shortness of breath on exertion.  History of DVT.  EXAM: CT ANGIOGRAPHY CHEST WITH CONTRAST  TECHNIQUE: Multidetector CT imaging of the chest was performed using the standard protocol during bolus administration of intravenous contrast. Multiplanar CT image reconstructions and MIPs were obtained to evaluate the vascular  anatomy.  CONTRAST:  135mL OMNIPAQUE IOHEXOL 350 MG/ML SOLN  COMPARISON:  Chest x-ray 08/17/2013.  Chest CT 03/20/2010.  FINDINGS: This is a suboptimal study for pulmonary embolus due to difficulty with IV contrast bolus. In addition there is extensive streak artifact. Patient's body habitus makes exam difficult. Nevertheless findings are suspicious for bilateral large pulmonary emboli. A repeat CT with better contrast bolus would be needed for confirmation. Thoracic aorta normal in caliber. No evidence of dissection or aneurysm. Cardiomegaly. Coronary artery disease.  No significant mediastinal lymph nodes are present.  Thoracic esophagus is not distended.  Large airways are patent.  Atelectasis.  No pneumothorax.  Visualized upper abdominal structures are unremarkable. No acute bony abnormality.  Review of the MIP images confirms the above findings.  IMPRESSION: 1. Very limited exam due to difficulty with IV contrast bolus and extensive streak artifact. Nevertheless findings suspicious for bilateral large pulmonary emboli. Repeat chest CT with better contrast bolus would be needed for confirmation.  2. Cardiomegaly. Coronary artery disease. Critical Value/emergent results were called by telephone at the time of interpretation on 08/26/2013 at 6:43 pm to the patient's caregiver, who verbally acknowledged these results.  Electronically Signed: ByMarcello Moores  Register On: 08/26/2013 18:46      MDM  Iv ns. Labs. Reviewed recent ct ?concern bil large PE.  Med service contacted for admission.  Reviewed nursing notes and prior charts for additional history.   lovenox ordered.  On review ct, it would appear pt still needs definitive dx new PE on anticoag before proclaiming failure of current oral anticoag and ivc filter, although it appears body habitus is severely limited the ability of obtaining such definitive study.     Mirna Mires, MD 08/27/13 [phone]

## 2013-08-27 NOTE — Progress Notes (Signed)
ANTICOAGULATION CONSULT NOTE - Initial Consult  Pharmacy Consult for Lovenox Indication: pulmonary embolus  Allergies  Allergen Reactions  . Adhesive [Tape] Itching and Rash  . Benazepril Hcl Cough    Patient Measurements: Height: 5\' 6"  (167.6 cm) Weight: 402 lb (182.346 kg) IBW/kg (Calculated) : 59.3  Vital Signs: Temp: 98.1 F (36.7 C) (08/06 2052) Temp src: Oral (08/06 2052) BP: 154/44 mmHg (08/06 2345) Pulse Rate: 79 (08/06 2345)  Labs:  Recent Labs  08/26/13 2322  HGB 12.0  HCT 38.1  PLT 276  CREATININE 0.98  TROPONINI <0.30    Estimated Creatinine Clearance: 104.6 ml/min (by C-G formula based on Cr of 0.98).   Medical History: Past Medical History  Diagnosis Date  . Obstructive sleep apnea     NPSG 05-29-07; AHI 32.9,cpap 7- AHI 0/hr weight 382  . Essential hypertension, benign   . Mixed hyperlipidemia   . Esophageal reflux   . COPD (chronic obstructive pulmonary disease)   . History of pulmonary embolism     Multiple, status post IVC filter, on Coumadin  . History of DVT (deep vein thrombosis)   . Atrial fibrillation   . GERD (gastroesophageal reflux disease)   . Osteoarthritis   . Factor V Leiden mutation   . Allergic rhinitis     Assessment: 63 y/o morbidly obese F with h/o PE and DVT on warfarin PTA along with IVC now with POSSIBLE new PE. Plan for now is to start Lovenox even if the INR is therapeutic. Had recently been on Lovenox bridge from warfarin for cardiac cath. INR on 8/3 was 1.4 (order for current INR has been placed).   Goal of Therapy:  Anti-Xa level 0.6-1 units/ml 4hrs after LMWH dose given Monitor platelets by anticoagulation protocol: Yes   Plan:  -Lovenox 1 mg/kg subcutaneous q12h -F/U plans for long term-anticoagulation  -Minimum q72h CBC  -Given morbid obesity, would likely check anti-Xa level with 4th or 5th dose of Lovenox -F/U INR  Narda Bonds 08/27/2013,1:40 AM

## 2013-08-27 NOTE — Progress Notes (Signed)
08/27/13 2120  BiPAP/CPAP/SIPAP  BiPAP/CPAP/SIPAP Pt Type Adult  Mask Type Full face mask  Mask Size Small  IPAP 9 cmH20  EPAP 9 cmH2O  Flow Rate 3 lpm  BiPAP/CPAP/SIPAP CPAP  Patient Home Equipment No  Auto Titrate No  BiPAP/CPAP /SiPAP Vitals  Pulse Rate 78  Resp 18  Patient placed on CPAP of 9 per home settings with 3L O2 bleed in. She tolerates it very well at this time.

## 2013-08-27 NOTE — Progress Notes (Signed)
*  PRELIMINARY RESULTS* Vascular Ultrasound Lower extremity venous duplex has been completed.  Preliminary findings: Technically limited due to body habitus. No obvious evidence of DVT in visualized veins.  Landry Mellow, RDMS, RVT  08/27/2013, 10:22 AM

## 2013-08-27 NOTE — H&P (Signed)
[facility] History and Physical  Patient: Rhonda Acevedo  OJJ:[id number]  DOB: 12/14/1950  DOS: the patient was seen and examined on 08/27/2013 PCP: Wenda Low, MD  Chief Complaint: Shortness of breath  HPI: Rhonda Acevedo is a 63 y.o. 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  patient presented with complaints of shortness of breath.she has been having difficulty with her breathing since last May progressively worsening she initially felt it was worsening of her COPD. She had a PFT in April which was not showing any significant change in her baseline PFT earlier. With her continuous complain of progressively worsening dyspnea on exertion and echocardiogram was performed which did not show any significant abnormality as well. Chest x-ray was also unremarkable as an outpatient. Patient was sent to cardiology for further workup and patient underwent coronary angiogram which showed normal coronaries without any obstruction. Patient had a pulmonary appointment in November and PCP ordered a CT image of chest to rule out recurrent pulmonary embolism as a cause of her shortness of breath which was performed today. Due to her body habitus and other difficulties the CT scans of her chest were equivocal with possibility of bilateral large pulmonary embolism. Her prior CT scan back in 2012 was not showing any pulmonary embolism. With this she is currently admitted in the hospital for further workup. Patient was given o150mg  of Lovenox as a bridge with Coumadin when she was scheduled for a coronary angiograph in one week ago.her current weight is 180 kg. Her INR was also subtherapeutic before beginning her on Lovenox. To 3 weeks ago she was placed on prednisone taper with which her INR increased to 5 and there were some changes  made in her warfarin dose.  The patient is coming from home. And at her baseline independent for most of her ADL.  Review of Systems: as mentioned in the history of present illness.  A Comprehensive review of the other systems is negative.  Past Medical History  Diagnosis Date  . Obstructive sleep apnea     NPSG 05-29-07; AHI 32.9,cpap 7- AHI 0/hr weight 382  . Essential hypertension, benign   . Mixed hyperlipidemia   . Esophageal reflux   . COPD (chronic obstructive pulmonary disease)   . History of pulmonary embolism     Multiple, status post IVC filter, on Coumadin  . History of DVT (deep vein thrombosis)   . Atrial fibrillation   . GERD (gastroesophageal reflux disease)   . Osteoarthritis   . Factor V Leiden mutation   . Allergic rhinitis    Past Surgical History  Procedure Laterality Date  . Ivc filter    . Ganglion cyst excision    . Hernia repair    . Hernia abscess    . I&d hernia incisional abscess    . Endometrial ablation    . Rotator cuff repair     Social History:  reports that she has never smoked. She does not have any smokeless tobacco history on file. She reports that she does not drink alcohol or use illicit drugs.  Allergies  Allergen Reactions  . Adhesive [Tape] Itching and Rash  . Benazepril Hcl Cough    Family History  Problem Relation Age of Onset  . Allergies    . Cancer Mother     Thyroid cancer  . Asthma    .  Heart disease Father     Heart attack  . COPD      Prior to Admission medications   Medication Sig Start Date End Date Taking? Authorizing Provider  Acetaminophen (TYLENOL ARTHRITIS PAIN PO) Take 2 tablets by mouth 2 (two) times daily.    Yes Historical Provider, MD  albuterol (ACCUNEB) 1.25 MG/3ML nebulizer solution Take 1 ampule by nebulization every 6 (six) hours as needed for wheezing or shortness of breath.    Yes Historical Provider, MD  albuterol (PROAIR HFA) 108 (90 BASE) MCG/ACT inhaler Inhale 2 puffs into the lungs 4  (four) times daily as needed for wheezing or shortness of breath.    Yes Historical Provider, MD  Calcium Carbonate-Vitamin D (CALCIUM-VITAMIN D) 600-200 MG-UNIT CAPS Take 1 capsule by mouth daily.   Yes Historical Provider, MD  cetirizine (ZYRTEC) 10 MG tablet Take 10 mg by mouth at bedtime.    Yes Historical Provider, MD  Cholecalciferol (VITAMIN D3) 2000 UNITS TABS Take 2,000 Units by mouth daily.    Yes Historical Provider, MD  diltiazem (CARDIZEM CD) 300 MG 24 hr capsule Take 300 mg by mouth daily.   Yes Historical Provider, MD  famotidine (PEPCID) 20 MG tablet Take 40 mg by mouth 2 (two) times daily.    Yes Historical Provider, MD  fluticasone (FLONASE) 50 MCG/ACT nasal spray Place 1 spray into the nose 2 (two) times daily.    Yes Historical Provider, MD  fluticasone (FLOVENT HFA) 44 MCG/ACT inhaler Inhale 2 puffs into the lungs daily.   Yes Historical Provider, MD  Fluticasone-Salmeterol (ADVAIR) 500-50 MCG/DOSE AEPB Inhale 1 puff into the lungs every 12 (twelve) hours.   Yes Historical Provider, MD  furosemide (LASIX) 20 MG tablet Take 20 mg by mouth 2 (two) times daily.    Yes Historical Provider, MD  losartan (COZAAR) 100 MG tablet Take 50 mg by mouth at bedtime.  09/18/12  Yes Historical Provider, MD  oxybutynin (DITROPAN) 5 MG tablet Take 5 mg by mouth 2 (two) times daily.   Yes Historical Provider, MD  OXYGEN-HELIUM IN Inhale 3 L into the lungs continuous.   Yes Historical Provider, MD  polyethylene glycol powder (GLYCOLAX/MIRALAX) powder Take 17 g by mouth daily. 11/04/11  Yes Historical Provider, MD  pravastatin (PRAVACHOL) 80 MG tablet Take 80 mg by mouth at bedtime.    Yes Historical Provider, MD  traMADol (ULTRAM) 50 MG tablet Take 50 mg by mouth every 6 (six) hours as needed for moderate pain.    Yes Historical Provider, MD  vitamin C (ASCORBIC ACID) 500 MG tablet Take 500 mg by mouth daily.   Yes Historical Provider, MD  warfarin (COUMADIN) 10 MG tablet Take 10 mg by mouth daily  at 6 PM. Take one tablet daily except none of Friday   Yes Historical Provider, MD  enoxaparin (LOVENOX) 150 MG/ML injection Inject 150 mg into the skin every 12 (twelve) hours.    Historical Provider, MD    Physical Exam: Filed Vitals:   08/27/13 0130 08/27/13 0145 08/27/13 0227 08/27/13 0508  BP: 119/94 104/56 145/69 120/48  Pulse: 92 79 85 71  Temp:   99.1 F (37.3 C) 99.2 F (37.3 C)  TempSrc:   Oral Oral  Resp: 21 20 18 18   Height:      Weight:    182.346 kg (402 lb)  SpO2: 96% 99% 100% 98%    General: Alert, Awake and Oriented to Time, Place and Person. Appear in mild distress Eyes: PERRL ENT:  Oral Mucosa clear moist. Neck:  difficult to assess  JVD Cardiovascular: S1 and S2 Present, no Murmur, Peripheral Pulses Present Respiratory: Bilateral Air entry equal and Decreased, Clear to Auscultation, noCrackles, no wheezes Abdomen: Bowel Sound Present, Soft and Non tender Skin:  large area of redness with induration noted on the abdomen no other Rash Extremities:  trace Pedal edema,  not  calf tenderness Neurologic: Grossly no focal neuro deficit.  Labs on Admission:  CBC:  Recent Labs Lab 08/26/13 2322 08/27/13 0533  WBC 6.7 6.7  NEUTROABS 4.0  --   HGB 12.0 11.7*  HCT 38.1 36.5  MCV 95.5 94.8  PLT 276 265    CMP     Component Value Date/Time   NA 139 08/26/2013 2322   K 3.9 08/26/2013 2322   CL 99 08/26/2013 2322   CO2 26 08/26/2013 2322   GLUCOSE 108* 08/26/2013 2322   BUN 14 08/26/2013 2322   CREATININE 0.98 08/26/2013 2322   CREATININE 0.93 08/17/2013 1628   CALCIUM 9.3 08/26/2013 2322   GFRNONAA 61* 08/26/2013 2322   GFRAA 71* 08/26/2013 2322    No results found for this basename: LIPASE, AMYLASE,  in the last 168 hours No results found for this basename: AMMONIA,  in the last 168 hours   Recent Labs Lab 08/26/13 2322  TROPONINI <0.30   BNP (last 3 results)  Recent Labs  08/26/13 2322  PROBNP 329.4*    Radiological Exams on Admission: Ct Angio Chest Pe  W/cm &/or Wo Cm  08/26/2013   ADDENDUM REPORT: 08/26/2013 20:25  ADDENDUM: IV contrast bolus was repeated to further evaluate the pulmonary arteries. Again due to the patient's size and due to streak artifact this is a very suboptimal exam. Bilateral pulmonary emboli cannot be excluded.   Electronically Signed   By: Marcello Moores  Register   On: 08/26/2013 20:25   08/26/2013   CLINICAL DATA:  Shortness of breath on exertion.  History of DVT.  EXAM: CT ANGIOGRAPHY CHEST WITH CONTRAST  TECHNIQUE: Multidetector CT imaging of the chest was performed using the standard protocol during bolus administration of intravenous contrast. Multiplanar CT image reconstructions and MIPs were obtained to evaluate the vascular anatomy.  CONTRAST:  124mL OMNIPAQUE IOHEXOL 350 MG/ML SOLN  COMPARISON:  Chest x-ray 08/17/2013.  Chest CT 03/20/2010.  FINDINGS: This is a suboptimal study for pulmonary embolus due to difficulty with IV contrast bolus. In addition there is extensive streak artifact. Patient's body habitus makes exam difficult. Nevertheless findings are suspicious for bilateral large pulmonary emboli. A repeat CT with better contrast bolus would be needed for confirmation. Thoracic aorta normal in caliber. No evidence of dissection or aneurysm. Cardiomegaly. Coronary artery disease.  No significant mediastinal lymph nodes are present. Thoracic esophagus is not distended.  Large airways are patent.  Atelectasis.  No pneumothorax.  Visualized upper abdominal structures are unremarkable. No acute bony abnormality.  Review of the MIP images confirms the above findings.  IMPRESSION: 1. Very limited exam due to difficulty with IV contrast bolus and extensive streak artifact. Nevertheless findings suspicious for bilateral large pulmonary emboli. Repeat chest CT with better contrast bolus would be needed for confirmation.  2. Cardiomegaly. Coronary artery disease. Critical Value/emergent results were called by telephone at the time of  interpretation on 08/26/2013 at 6:43 pm to the patient's caregiver, who verbally acknowledged these results.  Electronically Signed: ByMarcello Moores  Register On: 08/26/2013 18:46    EKG: Independently reviewed. normal sinus rhythm, nonspecific ST and T waves  changes. Assessment/Plan Principal Problem:   Pulmonary embolism Active Problems:   OBESITY HYPOVENTILATION SYNDROME   Essential hypertension, benign   GERD   Obstructive sleep apnea   Chronic respiratory failure with hypoxia   Exertional dyspnea   1. Pulmonary embolism The patient is presenting with complaint of exertional shortness of breath without any significant hypoxia more than her baseline. She has undergone extensive workup over last few months for the same. Today she presented with an equivocal CT chest angiogram suggesting large bilateral pulmonary embolism despite being on warfarin and Lovenox.  Possibility of recurrent pulmonary embolism cannot be ruled out and therefore I will obtain a VQ scan for confirmatory testing as well as an echocardiogram to see right ventricle pressures as well as pulmonary artery pressure. Lower extremity Doppler will also be checked.  Possible reason for recurrent pulmonary embolism include subtherapeutic INR, even prior to her being on Lovenox, and possible subtherapeutic dosing of Lovenox for less than 1 mg per kilogram dose. With this it wouldn't be modified as warfarin failure and she may benefit from continuation of the warfarin.  2. cellulitis of the abdominal skin  Patient has noted a redness of the abdominal skin, the redness has progressed 5 cm beyond her margins earlier noted in the day. She has warmth with tenderness and induration which favors cellulitis than the hematoma, but hematoma cannot be ruled out. With this aggressive spread I will place her on vancomycin for the treatment of the cellulitis.  3. obesity hypoventilation syndrome COPD Continue home oxygen, continue Lasix,  continue CPAP  on chronic anticoagulation Nutrition:  n.p.o. for a VQ scan   Code Status:  full   Family Communication: daughter  was present at bedside, opportunity was given to ask question and all questions were answered satisfactorily at the time of interview. Disposition: Admitted to inpatient in telemetry unit.  Author: Berle Mull, MD [facility] Pager: [phone] 08/27/2013, 6:04 AM    If 7PM-7AM, please contact night-coverage [email or web address] Password [email or web address]  **Disclaimer: This note may have been dictated with voice recognition software. Similar sounding words can inadvertently be transcribed and this note may contain transcription errors which may not have been corrected upon publication of note.**

## 2013-08-28 DIAGNOSIS — R0902 Hypoxemia: Secondary | ICD-10-CM

## 2013-08-28 DIAGNOSIS — J9610 Chronic respiratory failure, unspecified whether with hypoxia or hypercapnia: Secondary | ICD-10-CM

## 2013-08-28 LAB — CBC
HCT: 33.6 % — ABNORMAL LOW (ref 36.0–46.0)
HEMOGLOBIN: 10.5 g/dL — AB (ref 12.0–15.0)
MCH: 29.9 pg (ref 26.0–34.0)
MCHC: 31.3 g/dL (ref 30.0–36.0)
MCV: 95.7 fL (ref 78.0–100.0)
Platelets: 242 10*3/uL (ref 150–400)
RBC: 3.51 MIL/uL — ABNORMAL LOW (ref 3.87–5.11)
RDW: 13.9 % (ref 11.5–15.5)
WBC: 5.4 10*3/uL (ref 4.0–10.5)

## 2013-08-28 LAB — PROTIME-INR
INR: 1.98 — AB (ref 0.00–1.49)
Prothrombin Time: 22.5 seconds — ABNORMAL HIGH (ref 11.6–15.2)

## 2013-08-28 MED ORDER — ENOXAPARIN SODIUM 150 MG/ML ~~LOC~~ SOLN
180.0000 mg | Freq: Two times a day (BID) | SUBCUTANEOUS | Status: DC
  Administered 2013-08-28: 180 mg via SUBCUTANEOUS
  Filled 2013-08-28 (×2): qty 2

## 2013-08-28 MED ORDER — FUROSEMIDE 20 MG PO TABS
20.0000 mg | ORAL_TABLET | Freq: Two times a day (BID) | ORAL | Status: DC
  Administered 2013-08-28: 20 mg via ORAL
  Filled 2013-08-28 (×2): qty 1

## 2013-08-28 MED ORDER — WARFARIN SODIUM 10 MG PO TABS
10.0000 mg | ORAL_TABLET | Freq: Once | ORAL | Status: AC
  Administered 2013-08-28: 10 mg via ORAL
  Filled 2013-08-28: qty 1

## 2013-08-28 MED ORDER — ENOXAPARIN SODIUM 150 MG/ML ~~LOC~~ SOLN: 180.0000 mg | Freq: Two times a day (BID) | SUBCUTANEOUS | Status: DC

## 2013-08-28 MED ORDER — WARFARIN - PHARMACIST DOSING INPATIENT: Freq: Every day | Status: DC

## 2013-08-28 NOTE — Progress Notes (Addendum)
[place] for Lovenox / Coumadin Indication: pulmonary embolus  Allergies  Allergen Reactions  . Adhesive [Tape] Itching and Rash  . Benazepril Hcl Cough    Assessment: 63 y/o morbidly obese F with h/o PE and DVT on warfarin PTA along with IVC.  VQ scan negative for new PE  No bleeding noted  Continues only on Lovenox for anti-coag  Resuming Coumadin today    Labs:  Recent Labs  08/26/13 2322 08/27/13 0200 08/27/13 0533 08/28/13 0400  HGB 12.0  --  11.7* 10.5*  HCT 38.1  --  36.5 33.6*  PLT 276  --  265 242  APTT  --  56*  --   --   LABPROT  --  25.4* 23.0*  --   INR  --  2.31* 2.04*  --   CREATININE 0.98  --  1.05  --   TROPONINI <0.30  --   --   --     Estimated Creatinine Clearance: 97.6 ml/min (by C-G formula based on Cr of 1.05).  Goal of Therapy:  Anti-Xa level 0.6-1 units/ml 4hrs after LMWH dose given Monitor platelets by anticoagulation protocol: Yes INR = 2 to 3   Plan:  Continue Lovenox 180 mg sq Q 12 hours Coumadin 10 mg po x 1 Daily INR  Thank you. Anette Guarneri, PharmD [phone] 08/28/2013,10:59 AM

## 2013-08-28 NOTE — Discharge Summary (Signed)
Physician Discharge Summary  Rhonda Acevedo IOE:[id number] DOB: Feb 23, 1950 DOA: 08/26/2013  PCP: Wenda Low, MD  Admit date: 08/26/2013 Discharge date: 08/28/2013  Time spent: 30 minutes  Recommendations for Outpatient Follow-up:  1. followup with PCP in one week. 2. Check CBC in one week.  Discharge Diagnoses:  Principal Problem:   Pulmonary embolism Active Problems:   OBESITY HYPOVENTILATION SYNDROME   Essential hypertension, benign   GERD   Obstructive sleep apnea   Chronic respiratory failure with hypoxia   Exertional dyspnea   Discharge Condition: improved.   Diet recommendation: regular  Filed Weights   08/26/13 2052 08/27/13 0508  Weight: 182.346 kg (402 lb) 182.346 kg (402 lb)    History of present illness, hospital course:  Rhonda Acevedo is a 63 y.o. 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  patient presented with complaints of shortness of breath A repeat NM scan of the lungs did not show any PE. She was discharged home on one day of lovenox and coumadin. Please check cbc in one week. And please check iNR in one to two days. Echocardiogram did not show any right heart strain. No evidence of CHF. Lower extremity duplex did not reveal any dvt.      Procedures:  NM perfusion ventilation scan.   Consultations:  none  Discharge Exam: Filed Vitals:   08/28/13 1807  BP: 126/50  Pulse:   Temp:   Resp:     General: alert afebrile comfortable Cardiovascular: s1s2 Respiratory: ctab  Discharge Instructions You were cared for by a hospitalist during your hospital stay. If you have any questions about your discharge medications or the care you received while you were in the hospital after you are discharged, you can call the unit and asked to speak with the hospitalist on call if the  hospitalist that took care of you is not available. Once you are discharged, your primary care physician will handle any further medical issues. Please note that NO REFILLS for any discharge medications will be authorized once you are discharged, as it is imperative that you return to your primary care physician (or establish a relationship with a primary care physician if you do not have one) for your aftercare needs so that they can reassess your need for medications and monitor your lab values.  Discharge Instructions   Diet - low sodium heart healthy    Complete by:  As directed      Discharge instructions    Complete by:  As directed   Follow up with PCP on Monday. Please check CBC on Monday. Continue keflex for 5 more days.            Medication List    STOP taking these medications       fluticasone 44 MCG/ACT inhaler  Commonly known as:  FLOVENT HFA      TAKE these medications       Calcium-Vitamin D 600-200 MG-UNIT Caps  Take 1 capsule by mouth daily.     cetirizine 10 MG tablet  Commonly known as:  ZYRTEC  Take 10 mg by mouth at bedtime.     diltiazem 300 MG 24 hr capsule  Commonly known as:  CARDIZEM CD  Take 300 mg by mouth daily.     enoxaparin 150 MG/ML injection  Commonly known as:  LOVENOX  Inject 1.2 mLs (  180 mg total) into the skin every 12 (twelve) hours.     famotidine 20 MG tablet  Commonly known as:  PEPCID  Take 40 mg by mouth 2 (two) times daily.     fluticasone 50 MCG/ACT nasal spray  Commonly known as:  FLONASE  Place 1 spray into the nose 2 (two) times daily.     Fluticasone-Salmeterol 500-50 MCG/DOSE Aepb  Commonly known as:  ADVAIR  Inhale 1 puff into the lungs every 12 (twelve) hours.     furosemide 20 MG tablet  Commonly known as:  LASIX  Take 20 mg by mouth 2 (two) times daily.     losartan 100 MG tablet  Commonly known as:  COZAAR  Take 50 mg by mouth at bedtime.     oxybutynin 5 MG tablet  Commonly known as:  DITROPAN  Take  5 mg by mouth 2 (two) times daily.     OXYGEN-HELIUM IN  Inhale 3 L into the lungs continuous.     polyethylene glycol powder powder  Commonly known as:  GLYCOLAX/MIRALAX  Take 17 g by mouth daily.     pravastatin 80 MG tablet  Commonly known as:  PRAVACHOL  Take 80 mg by mouth at bedtime.     PROAIR HFA 108 (90 BASE) MCG/ACT inhaler  Generic drug:  albuterol  Inhale 2 puffs into the lungs 4 (four) times daily as needed for wheezing or shortness of breath.     albuterol 1.25 MG/3ML nebulizer solution  Commonly known as:  ACCUNEB  Take 1 ampule by nebulization every 6 (six) hours as needed for wheezing or shortness of breath.     traMADol 50 MG tablet  Commonly known as:  ULTRAM  Take 50 mg by mouth every 6 (six) hours as needed for moderate pain.     TYLENOL ARTHRITIS PAIN PO  Take 2 tablets by mouth 2 (two) times daily.     vitamin C 500 MG tablet  Commonly known as:  ASCORBIC ACID  Take 500 mg by mouth daily.     Vitamin D3 2000 UNITS Tabs  Take 2,000 Units by mouth daily.     warfarin 10 MG tablet  Commonly known as:  COUMADIN  Take 10 mg by mouth daily at 6 PM. Take one tablet daily except none of Friday       Allergies  Allergen Reactions  . Adhesive [Tape] Itching and Rash  . Benazepril Hcl Cough       Follow-up Information   Follow up with Wenda Low, MD On 08/30/2013.   Specialty:  Internal Medicine   Contact information:   [address] [place] [address] [phone]        The results of significant diagnostics from this hospitalization (including imaging, microbiology, ancillary and laboratory) are listed below for reference.    Significant Diagnostic Studies: Dg Chest 2 View  08/17/2013   CLINICAL DATA:  Shortness of breath, pre cardiac catheterization  EXAM: CHEST  2 VIEW  COMPARISON:  03/13/2010  FINDINGS: Cardiac shadow is mildly enlarged. The lungs are clear bilaterally. No acute bony abnormality is seen.   IMPRESSION: No active cardiopulmonary disease.   Electronically Signed   By: Inez Catalina M.D.   On: 08/17/2013 16:19   Ct Angio Chest Pe W/cm &/or Wo Cm  08/26/2013   ADDENDUM REPORT: 08/26/2013 20:25  ADDENDUM: IV contrast bolus was repeated to further evaluate the pulmonary arteries. Again due to the patient's size and due to  streak artifact this is a very suboptimal exam. Bilateral pulmonary emboli cannot be excluded.   Electronically Signed   By: Marcello Moores  Register   On: 08/26/2013 20:25   08/26/2013   CLINICAL DATA:  Shortness of breath on exertion.  History of DVT.  EXAM: CT ANGIOGRAPHY CHEST WITH CONTRAST  TECHNIQUE: Multidetector CT imaging of the chest was performed using the standard protocol during bolus administration of intravenous contrast. Multiplanar CT image reconstructions and MIPs were obtained to evaluate the vascular anatomy.  CONTRAST:  171mL OMNIPAQUE IOHEXOL 350 MG/ML SOLN  COMPARISON:  Chest x-ray 08/17/2013.  Chest CT 03/20/2010.  FINDINGS: This is a suboptimal study for pulmonary embolus due to difficulty with IV contrast bolus. In addition there is extensive streak artifact. Patient's body habitus makes exam difficult. Nevertheless findings are suspicious for bilateral large pulmonary emboli. A repeat CT with better contrast bolus would be needed for confirmation. Thoracic aorta normal in caliber. No evidence of dissection or aneurysm. Cardiomegaly. Coronary artery disease.  No significant mediastinal lymph nodes are present. Thoracic esophagus is not distended.  Large airways are patent.  Atelectasis.  No pneumothorax.  Visualized upper abdominal structures are unremarkable. No acute bony abnormality.  Review of the MIP images confirms the above findings.  IMPRESSION: 1. Very limited exam due to difficulty with IV contrast bolus and extensive streak artifact. Nevertheless findings suspicious for bilateral large pulmonary emboli. Repeat chest CT with better contrast bolus would be needed  for confirmation.  2. Cardiomegaly. Coronary artery disease. Critical Value/emergent results were called by telephone at the time of interpretation on 08/26/2013 at 6:43 pm to the patient's caregiver, who verbally acknowledged these results.  Electronically Signed: ByMarcello Moores  Register On: 08/26/2013 18:46   Nm Pulmonary Perf And Vent  08/27/2013   CLINICAL DATA:  Exertional dyspnea, inconclusive CT  EXAM: NUCLEAR MEDICINE VENTILATION - PERFUSION LUNG SCAN  TECHNIQUE: Ventilation images were obtained in multiple projections using inhaled aerosol technetium 99 M DTPA. Perfusion images were obtained in multiple projections after intravenous injection of Tc-26m MAA.  RADIOPHARMACEUTICALS:  Forty mCi Tc-30m DTPA aerosol and 6 mCi Tc-4m MAA  COMPARISON:  None.  FINDINGS: Ventilation: Some central trapping is noted. Free technetium is noted within the stomach. No ventilation defect is seen.  Perfusion: No wedge shaped peripheral perfusion defects to suggest acute pulmonary embolism.  IMPRESSION: No evidence of pulmonary embolism.   Electronically Signed   By: Inez Catalina M.D.   On: 08/27/2013 12:59    Microbiology: No results found for this or any previous visit (from the past 240 hour(s)).   Labs: Basic Metabolic Panel:  Recent Labs Lab 08/26/13 2322 08/27/13 0533  NA 139 139  K 3.9 3.9  CL 99 100  CO2 26 23  GLUCOSE 108* 110*  BUN 14 15  CREATININE 0.98 1.05  CALCIUM 9.3 9.2   Liver Function Tests:  Recent Labs Lab 08/27/13 0533  AST 22  ALT 26  ALKPHOS 71  BILITOT 0.2*  PROT 6.4  ALBUMIN 3.5   No results found for this basename: LIPASE, AMYLASE,  in the last 168 hours No results found for this basename: AMMONIA,  in the last 168 hours CBC:  Recent Labs Lab 08/26/13 2322 08/27/13 0533 08/28/13 0400  WBC 6.7 6.7 5.4  NEUTROABS 4.0  --   --   HGB 12.0 11.7* 10.5*  HCT 38.1 36.5 33.6*  MCV 95.5 94.8 95.7  PLT 276 265 242   Cardiac Enzymes:  Recent Labs Lab  08/26/13 2322  TROPONINI <0.30   BNP: BNP (last 3 results)  Recent Labs  08/26/13 2322  PROBNP 329.4*   CBG: No results found for this basename: GLUCAP,  in the last 168 hours     Signed:  Ibraham Levi  [facility] 08/28/2013, 6:38 PM

## 2013-09-07 ENCOUNTER — Ambulatory Visit: Payer: Commercial Managed Care - HMO | Admitting: Internal Medicine

## 2013-09-07 ENCOUNTER — Encounter: Payer: Self-pay | Admitting: Internal Medicine

## 2013-09-07 VITALS — BP 112/72 | HR 65 | Ht 66.0 in | Wt >= 6400 oz

## 2013-09-07 DIAGNOSIS — G4733 Obstructive sleep apnea (adult) (pediatric): Secondary | ICD-10-CM

## 2013-09-07 MED ORDER — UMECLIDINIUM-VILANTEROL 62.5-25 MCG/INH IN AEPB: 1.0000 | INHALATION_SPRAY | Freq: Every day | RESPIRATORY_TRACT | Status: DC

## 2013-09-07 NOTE — Patient Instructions (Addendum)
Ok to increase your oxygen to 4 or 5 liters as needed with exertion. When you sit, turn it back down to 3 for rest and sleep  Ok to continue CPAP 7/ [employer]  Order- DME [employer] CPAP download for pressure compliance  Dx OSA  Sample Anoro maintenance inhaler   1 puff, one time daily   Try this instead of Advair. When you run out of Anoro, go back to your Advair for comparison

## 2013-09-07 NOTE — Progress Notes (Signed)
78 yoF followed for OSA, OHS, morbid obesity, complicated by hx DVT, asthma/ bronchitis, GERD LOV- 03/27/10   daughter and granddaughter here.   PCP Dr. [name] She continues oxygen 3 L per minute continuously/[name]. CPAP 7 CWP is used all night every night without a humidifier by her preference. She feels that she is breathing "through a fog". Hoarseness comes and goes with any strong odor. Still using Advair, nebulizer up to 4 times daily and rescue inhaler less than every day. Recognizes some reflux. Last PFT was 05/05/2007.  05/09/11- 60 yoF followed for OSA, OHS, morbid obesity, complicated by hx DVT, asthma/ bronchitis, GERD Breathing "is about the same" as when seen 03/27/11, pt states voice is improving. Pt had PFT 04/12/11- to be reviewed with pt.   We gave Benicar instead of her ACE inhibitor. With the change, she noticed less cough and hoarseness then with lisinopril. Some heartburn on OTC acid blocker. Using Pulmicort powder. Comfortable with CPAP used all might every night. PFT 04/12/2011-normal spirometry flows within significant response to bronchodilator. FEV1/FVC 0.79. Mild restriction and moderate diffusion reduction both may be do to her obesity.  11/08/11- 70 yoF followed for OSA, OHS, morbid obesity, complicated by hx DVT/ PE, asthma/ bronchitis, GERD SOB and wheezing with exertion; wears O2 all the time She is staying on oxygen at 3 L. CPAP 7/[name] plus oxygen at 3 L, good compliance and control with no concerns. She has not lost weight [phone]). Sample Dulera work during well but she preferred to stay on Advair, which she was used to. Dr [name] manages her Coumadin.  11/09/12-  56 yoF followed for OSA, OHS, morbid obesity, complicated by hx DVT/ PE, asthma/ bronchitis, GERD SOB and wheezing with exertion; wears O2 all the time Follows For: SOB a little worse - Some wheezing - Cough occas prod (tlight yellow -clear) - Wearing CPAP 9-10 hrs/night - Mask fits well CPAP  7/[name] plus oxygen at 3 L continuous.  Weight up 14  More lbs since 2013. Minor cold.  Stays indoors to avoid pollen and odors. Helps to use neb twice daily, Advair 500.   09/07/13- 52 yoF followed for OSA, OHS, morbid obesity, complicated by hx DVT/ PE, asthma/ bronchitis, GERD FOLLOWS FOR: Wears CPAP 7/ [facility] with O2 3L continuous every night for about 8-10 hours; DME is [facility]. Pt having increased SOB, wheezing, cough-non productive. Chest tightness at times.   CXR 08/17/13-IMPRESSION:  No active cardiopulmonary disease.  Electronically Signed  By: Inez Catalina M.D.  On: 08/17/2013 16:19 V/Q scan low probability CT chest 08/26/13 IMPRESSION:  1. Very limited exam due to difficulty with IV contrast bolus and  extensive streak artifact. Nevertheless findings suspicious for  bilateral large pulmonary emboli. Repeat chest CT with better  contrast bolus would be needed for confirmation.  2. Cardiomegaly. Coronary artery disease. Critical Value/emergent  results were called by telephone at the time of interpretation on  08/26/2013 at 6:43 pm to the patient's caregiver, who verbally  acknowledged these results.  Electronically Signed:  ByMarcello Moores Register  On: 08/26/2013 18:46  ROS-see HPI Constitutional:   No-   weight loss, night sweats, fevers, chills, fatigue, lassitude. HEENT:   No-  headaches, difficulty swallowing, tooth/dental problems, sore throat,       No-  sneezing, itching, ear ache, +nasal congestion, post nasal drip,  CV:  No-   chest pain, orthopnea, PND, swelling in lower extremities, anasarca,  dizziness, palpitations Resp: + shortness of breath with exertion or at rest.  No-   productive cough,  Little non-productive cough,  No- coughing up of blood.              No-   change in color of mucus.  No- wheezing.   Skin: No-   rash or lesions. GI:  No-   heartburn, indigestion, abdominal pain, nausea, vomiting,  GU:  MS:  No-   joint pain or swelling.    Neuro-     nothing unusual Psych:  No- change in mood or affect. No depression or anxiety.  No memory loss.  OBJ- Physical Exam General- Alert, Oriented, Affect-appropriate, Distress- none acute. Morbid obesity. O2 3L Skin- rash-none, lesions- none, excoriation- none Lymphadenopathy- none Head- atraumatic            Eyes- Gross vision intact, PERRLA, conjunctivae and secretions clear            Ears- Hearing, canals-normal            Nose- Clear, no-Septal dev, mucus, polyps, erosion, perforation             Throat- Mallampati II-III , mucosa clear , drainage- none, tonsils- atrophic. Mild hoarseness, throat clearing Neck- flexible , trachea midline, no stridor , thyroid nl, carotid no bruit Chest - symmetrical excursion , unlabored           Heart/CV- RRR , no murmur , no gallop  , no rub, nl s1 s2                           - JVD- none , edema- none, stasis changes- none, varices- none           Lung- clear to P&A, wheeze- none, cough- none , dullness-none, rub- none. Shallow c/w habitus           Chest wall-  Abd- Br/ Gen/ Rectal- Not done, not indicated Extrem- cyanosis- none, clubbing, none, atrophy- none, strength- nl Neuro- grossly intact to observation

## 2013-11-09 ENCOUNTER — Encounter: Payer: Self-pay | Admitting: Internal Medicine

## 2013-11-09 ENCOUNTER — Ambulatory Visit (INDEPENDENT_AMBULATORY_CARE_PROVIDER_SITE_OTHER): Payer: Commercial Managed Care - HMO | Admitting: Internal Medicine

## 2013-11-09 VITALS — BP 110/80 | HR 66 | Ht 66.0 in | Wt >= 6400 oz

## 2013-11-09 DIAGNOSIS — J9611 Chronic respiratory failure with hypoxia: Secondary | ICD-10-CM

## 2013-11-09 DIAGNOSIS — J4530 Mild persistent asthma, uncomplicated: Secondary | ICD-10-CM

## 2013-11-09 DIAGNOSIS — G4733 Obstructive sleep apnea (adult) (pediatric): Secondary | ICD-10-CM

## 2013-11-09 NOTE — Patient Instructions (Addendum)
Flu vax  Order- please request download result from [place] for pressure compliance    Dx OSA  Order - schedule PFT  Dx Asthma with bronchitis,

## 2013-11-09 NOTE — Progress Notes (Signed)
80 yoF followed for OSA, OHS, morbid obesity, complicated by hx DVT, asthma/ bronchitis, GERD LOV- 03/27/10   daughter and granddaughter here.   PCP Dr. [name] She continues oxygen 3 L per minute continuously/[name]. CPAP 7 CWP is used all night every night without a humidifier by her preference. She feels that she is breathing "through a fog". Hoarseness comes and goes with any strong odor. Still using Advair, nebulizer up to 4 times daily and rescue inhaler less than every day. Recognizes some reflux. Last PFT was 05/05/2007.  05/09/11- 60 yoF followed for OSA, OHS, morbid obesity, complicated by hx DVT, asthma/ bronchitis, GERD Breathing "is about the same" as when seen 03/27/11, pt states voice is improving. Pt had PFT 04/12/11- to be reviewed with pt.   We gave Benicar instead of her ACE inhibitor. With the change, she noticed less cough and hoarseness then with lisinopril. Some heartburn on OTC acid blocker. Using Pulmicort powder. Comfortable with CPAP used all might every night. PFT 04/12/2011-normal spirometry flows within significant response to bronchodilator. FEV1/FVC 0.79. Mild restriction and moderate diffusion reduction both may be do to her obesity.  11/08/11- 59 yoF followed for OSA, OHS, morbid obesity, complicated by hx DVT/ PE, asthma/ bronchitis, GERD SOB and wheezing with exertion; wears O2 all the time She is staying on oxygen at 3 L. CPAP 7/[name] plus oxygen at 3 L, good compliance and control with no concerns. She has not lost weight [phone]). Sample Dulera work during well but she preferred to stay on Advair, which she was used to. Dr [name] manages her Coumadin.  11/09/12-  60 yoF followed for OSA, OHS, morbid obesity, complicated by hx DVT/ PE, asthma/ bronchitis, GERD SOB and wheezing with exertion; wears O2 all the time Follows For: SOB a little worse - Some wheezing - Cough occas prod (tlight yellow -clear) - Wearing CPAP 9-10 hrs/night - Mask fits well CPAP  7/[name] plus oxygen at 3 L continuous.  Weight up 14  More lbs since 2013. Minor cold.  Stays indoors to avoid pollen and odors. Helps to use neb twice daily, Advair 500.   09/07/13- 33 yoF followed for OSA, OHS, morbid obesity, complicated by hx DVT/ PE, asthma/ bronchitis, GERD FOLLOWS FOR: Wears CPAP 7/ [facility] with O2 3L-5 continuous every night for about 8-10 hours; DME is [facility]. Pt having increased SOB, wheezing, cough-non productive. Chest tightness at times. Tried Anoro.     CXR 08/17/13-IMPRESSION:  No active cardiopulmonary disease.  Electronically Signed  By: Inez Catalina M.D.  On: 08/17/2013 16:19 V/Q scan low probability CT chest 08/26/13 IMPRESSION:  1. Very limited exam due to difficulty with IV contrast bolus and  extensive streak artifact. Nevertheless findings suspicious for  bilateral large pulmonary emboli. Repeat chest CT with better  contrast bolus would be needed for confirmation.  2. Cardiomegaly. Coronary artery disease. Critical Value/emergent  results were called by telephone at the time of interpretation on  08/26/2013 at 6:43 pm to the patient's caregiver, who verbally  acknowledged these results.  Electronically Signed:  ByMarcello Moores Register  On: 08/26/2013 18:46  11/09/13- 37 yoF followed for OSA, OHS, morbid obesity, complicated by hx DVT/ PE, asthma/ bronchitis, GERD            Family here FOLLOWS FOR: Wear CPAP 7/ [facility] with O2 3L every night for 8-10 hours.  Patient has wheezing, sob upon exertion. Patient denies chest tightness Thinks she is getting a cold-no fever but mild sore throat and nasal congestion.  Sample of Anoro had no affect. Nebulizer is helpful. She thinks being off of Pulmicort inhaler caused her to be worse for the past 6 months but she is now using Pulmicort by nebulizer.  ROS-see HPI Constitutional:   No-   weight loss, night sweats, fevers, chills, fatigue, lassitude. HEENT:   No-  headaches, difficulty swallowing, tooth/dental  problems, sore throat,       No-  sneezing, itching, ear ache, +nasal congestion, post nasal drip,  CV:  No-   chest pain, orthopnea, PND, swelling in lower extremities, anasarca,  dizziness, palpitations Resp: + shortness of breath with exertion or at rest.              No-   productive cough,  +Little non-productive cough,  No- coughing up of blood.              No-   change in color of mucus.  No- wheezing.   Skin: No-   rash or lesions. GI:  No-   heartburn, indigestion, abdominal pain, nausea, vomiting,  GU:  MS:  No-   joint pain or swelling.   Neuro-     nothing unusual Psych:  No- change in mood or affect. No depression or anxiety.  No memory loss.  OBJ- Physical Exam General- Alert, Oriented, Affect-appropriate, Distress- none acute. Morbid obesity. O2 3L Skin- rash-none, lesions- none, excoriation- none Lymphadenopathy- none Head- atraumatic            Eyes- Gross vision intact, PERRLA, conjunctivae and secretions clear            Ears- Hearing, canals-normal            Nose- Clear, no-Septal dev, mucus, polyps, erosion, perforation             Throat- Mallampati II-III , mucosa clear , drainage- none, tonsils- atrophic. Mild hoarseness, throat clearing Neck- flexible , trachea midline, no stridor , thyroid nl, carotid no bruit Chest - symmetrical excursion , unlabored           Heart/CV- RRR , no murmur , no gallop  , no rub, nl s1 s2                           - JVD- none , edema- none, stasis changes- none, varices- none           Lung- clear to P&A, wheeze- none, cough- none , dullness-none, rub- none. Shallow c/w habitus           Chest wall-  Abd- Br/ Gen/ Rectal- Not done, not indicated Extrem- cyanosis- none, clubbing, none, atrophy- none, strength- nl Neuro- grossly intact to observation

## 2013-11-10 NOTE — Assessment & Plan Note (Signed)
Plan: Flu vaccine, continue oxygen at 3 L

## 2013-11-10 NOTE — Assessment & Plan Note (Signed)
She describes good compliance and control with CPAP 7/O2 3 L. Plan-request download for pressure and compliance verification

## 2013-12-30 ENCOUNTER — Encounter (HOSPITAL_COMMUNITY): Payer: Self-pay | Admitting: Cardiovascular Disease

## 2014-02-10 ENCOUNTER — Encounter: Payer: Self-pay | Admitting: Internal Medicine

## 2014-02-10 ENCOUNTER — Ambulatory Visit (INDEPENDENT_AMBULATORY_CARE_PROVIDER_SITE_OTHER): Payer: PPO | Admitting: Internal Medicine

## 2014-02-10 VITALS — BP 118/78 | HR 87 | Ht 66.0 in | Wt >= 6400 oz

## 2014-02-10 DIAGNOSIS — R0609 Other forms of dyspnea: Secondary | ICD-10-CM

## 2014-02-10 DIAGNOSIS — G4733 Obstructive sleep apnea (adult) (pediatric): Secondary | ICD-10-CM

## 2014-02-10 DIAGNOSIS — E662 Morbid (severe) obesity with alveolar hypoventilation: Secondary | ICD-10-CM

## 2014-02-10 DIAGNOSIS — J4530 Mild persistent asthma, uncomplicated: Secondary | ICD-10-CM

## 2014-02-10 DIAGNOSIS — I2699 Other pulmonary embolism without acute cor pulmonale: Secondary | ICD-10-CM

## 2014-02-10 LAB — PULMONARY FUNCTION TEST
DL/VA % pred: 94 %
DL/VA: 4.78 ml/min/mmHg/L
DLCO UNC % PRED: 70 %
DLCO UNC: 19.12 ml/min/mmHg
FEF 25-75 Post: 1.75 L/sec
FEF 25-75 Pre: 1.96 L/sec
FEF2575-%Change-Post: -10 %
FEF2575-%PRED-PRE: 81 %
FEF2575-%Pred-Post: 72 %
FEV1-%CHANGE-POST: -2 %
FEV1-%PRED-POST: 72 %
FEV1-%Pred-Pre: 74 %
FEV1-Post: 1.96 L
FEV1-Pre: 2.01 L
FEV1FVC-%CHANGE-POST: 1 %
FEV1FVC-%Pred-Pre: 102 %
FEV6-%Change-Post: -3 %
FEV6-%PRED-POST: 71 %
FEV6-%Pred-Pre: 73 %
FEV6-Post: 2.42 L
FEV6-Pre: 2.5 L
FEV6FVC-%CHANGE-POST: 0 %
FEV6FVC-%PRED-POST: 104 %
FEV6FVC-%Pred-Pre: 103 %
FVC-%Change-Post: -4 %
FVC-%PRED-PRE: 71 %
FVC-%Pred-Post: 68 %
FVC-PRE: 2.52 L
FVC-Post: 2.42 L
POST FEV6/FVC RATIO: 100 %
Post FEV1/FVC ratio: 81 %
Pre FEV1/FVC ratio: 80 %
Pre FEV6/FVC Ratio: 99 %

## 2014-02-10 MED ORDER — BUDESONIDE 180 MCG/ACT IN AEPB: INHALATION_SPRAY | RESPIRATORY_TRACT | Status: DC

## 2014-02-10 NOTE — Patient Instructions (Signed)
Follow Dr Glenna Durand guidance for home O2 since he is doing the tests and I can't see the results.  Order- [facility]- download CPAP for pressure compliance   Dx OSA, OHS  Script for Pulmicort Flexhaler  2 puffs and rinse mouth twice daily      This can replace using pulmicort/ budesonide in your nebulizer machine

## 2014-02-10 NOTE — Progress Notes (Signed)
PFT done today. 

## 2014-02-10 NOTE — Progress Notes (Signed)
80 yoF followed for OSA, OHS, morbid obesity, complicated by hx DVT, asthma/ bronchitis, GERD LOV- 03/27/10   daughter and granddaughter here.   PCP Rhonda Acevedo She continues oxygen 3 L per minute continuously/[facility]. CPAP 7 CWP is used all night every night without a humidifier by her preference. She feels that she is breathing "through a fog". Hoarseness comes and goes with any strong odor. Still using Advair, nebulizer up to 4 times daily and rescue inhaler less than every day. Recognizes some reflux. Last PFT was 05/05/2007.  05/09/11- 60 yoF followed for OSA, OHS, morbid obesity, complicated by hx DVT, asthma/ bronchitis, GERD Breathing "is about the same" as when seen 03/27/11, pt states voice is improving. Pt had PFT 04/12/11- to be reviewed with pt.   We gave Benicar instead of her ACE inhibitor. With the change, she noticed less cough and hoarseness then with lisinopril. Some heartburn on OTC acid blocker. Using Pulmicort powder. Comfortable with CPAP used all might every night. PFT 04/12/2011-normal spirometry flows within significant response to bronchodilator. FEV1/FVC 0.79. Mild restriction and moderate diffusion reduction both may be do to her obesity.  11/08/11- 59 yoF followed for OSA, OHS, morbid obesity, complicated by hx DVT/ PE, asthma/ bronchitis, GERD SOB and wheezing with exertion; wears O2 all the time She is staying on oxygen at 3 L. CPAP 7/[facility] plus oxygen at 3 L, good compliance and control with no concerns. She has not lost weight [phone]). Sample Dulera work during well but she preferred to stay on Advair, which she was used to. Dr Rhonda Acevedo manages her Coumadin.  11/09/12-  60 yoF followed for OSA, OHS, morbid obesity, complicated by hx DVT/ PE, asthma/ bronchitis, GERD SOB and wheezing with exertion; wears O2 all the time Follows For: SOB a little worse - Some wheezing - Cough occas prod (tlight yellow -clear) - Wearing CPAP 9-10 hrs/night - Mask fits well CPAP  7/[facility] plus oxygen at 3 L continuous.  Weight up 14  More lbs since 2013. Minor cold.  Stays indoors to avoid pollen and odors. Helps to use neb twice daily, Advair 500.   09/07/13- 33 yoF followed for OSA, OHS, morbid obesity, complicated by hx DVT/ PE, asthma/ bronchitis, GERD FOLLOWS FOR: Wears CPAP 7/ [facility] with O2 3L-5 continuous every night for about 8-10 hours; [facility] is [facility]. Pt having increased SOB, wheezing, cough-non productive. Chest tightness at times. Tried Anoro.     CXR 08/17/13-IMPRESSION:  No active cardiopulmonary disease.  Electronically Signed  By: Inez Catalina M.D.  On: 08/17/2013 16:19 V/Q scan low probability CT chest 08/26/13 IMPRESSION:  1. Very limited exam due to difficulty with IV contrast bolus and  extensive streak artifact. Nevertheless findings suspicious for  bilateral large pulmonary emboli. Repeat chest CT with better  contrast bolus would be needed for confirmation.  2. Cardiomegaly. Coronary artery disease. Critical Value/emergent  results were called by telephone at the time of interpretation on  08/26/2013 at 6:43 pm to the patient's caregiver, who verbally  acknowledged these results.  Electronically Signed:  ByMarcello Acevedo Register  On: 08/26/2013 18:46  11/09/13- 37 yoF followed for OSA, OHS, morbid obesity, complicated by hx DVT/ PE, asthma/ bronchitis, GERD            Family here FOLLOWS FOR: Wear CPAP 7/ [facility] with O2 3L every night for 8-10 hours.  Patient has wheezing, sob upon exertion. Patient denies chest tightness Thinks she is getting a cold-no fever but mild sore throat and nasal congestion.  Sample of Anoro had no affect. Nebulizer is helpful. She thinks being off of Pulmicort inhaler caused her to be worse for the past 6 months but she is now using Pulmicort by nebulizer.  02/10/14- 63 yoF never smoker followed for OSA, OHS, morbid obesity, complicated by hx DVT/ PE, asthma/ bronchitis, GERD            Family here Dr Rhonda Acevedo has  rechecked ONOX to requalify but she may not need it. She says she did not desaturate on a walk test. With insurance change, she is changing from [place] to [facility] for her CPAP 7 and oxygen 3 L. Uses nebulizer and albuterol "sometimes" and says if she needs it he will make a difference, but she does not usually wheeze except with exertion. Easy dyspnea on exertion without acute event. PFT: 02/10/2014-obstructive and restrictive changes and reduction of diffusion best explained by her obesity/hypoventilation syndrome. No response to bronchodilator supporting impression that asthma/bronchitis is well controlled.  ROS-see HPI Constitutional:   No-   weight loss, night sweats, fevers, chills, fatigue, lassitude. HEENT:   No-  headaches, difficulty swallowing, tooth/dental problems, sore throat,       No-  sneezing, itching, ear ache, +nasal congestion, post nasal drip,  CV:  No-   chest pain, orthopnea, PND, swelling in lower extremities, anasarca,  dizziness, palpitations Resp: + shortness of breath with exertion or at rest.              No-   productive cough,  +Little non-productive cough,  No- coughing up of blood.              No-   change in color of mucus.  No- wheezing.   Skin: No-   rash or lesions. GI:  No-   heartburn, indigestion, abdominal pain, nausea, vomiting,  GU:  MS:  No-   joint pain or swelling.   Neuro-     nothing unusual Psych:  No- change in mood or affect. No depression or anxiety.  No memory loss.  OBJ- Physical Exam General- Alert, Oriented, Affect-appropriate, Distress- none acute. +Morbid obesity. O2 3L Skin- rash-none, lesions- none, excoriation- none Lymphadenopathy- none Head- atraumatic            Eyes- Gross vision intact, PERRLA, conjunctivae and secretions clear            Ears- Hearing, canals-normal            Nose- Clear, no-Septal dev, mucus, polyps, erosion, perforation             Throat- Mallampati II-III , mucosa clear , drainage- none, tonsils-  atrophic. Mild hoarseness, throat clearing Neck- flexible , trachea midline, no stridor , thyroid nl, carotid no bruit Chest - symmetrical excursion , unlabored           Heart/CV- RRR , no murmur , no gallop  , no rub, nl s1 s2                           - JVD- none , edema-+1-2+, stasis changes+chronic, varices- none           Lung- clear to P&A, wheeze- none, cough- none , dullness-none, rub- none. Shallow c/w habitus           Chest wall-  Abd- Br/ Gen/ Rectal- Not done, not indicated Extrem- cyanosis- none, clubbing, none, atrophy- none, strength- nl Neuro- grossly intact to observation

## 2014-02-11 NOTE — Assessment & Plan Note (Signed)
She has not been able to lose weight which would make the most difference. Plan-now that she has changed to [facility] we will request download for pressure compliance

## 2014-02-11 NOTE — Assessment & Plan Note (Signed)
Not sure that her previous pulmonary embolism can be blamed for her current oxygen needs and diffusion capacity. Her body weight would be sufficient explanation.

## 2014-02-11 NOTE — Assessment & Plan Note (Signed)
Lung function is not severely reduced and I expect the biggest problem for her exertional dyspnea comes from her obesity and deconditioning.

## 2014-02-11 NOTE — Assessment & Plan Note (Signed)
Evident on visual examination. I discussed impact of her weight on diaphragm movement and lung volumes as reflected in her PFTs.

## 2014-03-15 ENCOUNTER — Encounter: Payer: Self-pay | Admitting: Internal Medicine

## 2014-05-11 ENCOUNTER — Other Ambulatory Visit: Payer: Self-pay | Admitting: Gastroenterology

## 2014-07-22 ENCOUNTER — Other Ambulatory Visit: Payer: Self-pay | Admitting: Gastroenterology

## 2014-08-04 ENCOUNTER — Encounter (HOSPITAL_COMMUNITY): Payer: Self-pay | Admitting: *Deleted

## 2014-08-08 ENCOUNTER — Other Ambulatory Visit: Payer: Self-pay | Admitting: Gastroenterology

## 2014-08-09 ENCOUNTER — Encounter (HOSPITAL_COMMUNITY)
Admission: RE | Disposition: A | Discharge status: Home / Self Care | Payer: Self-pay | Source: Ambulatory Visit | Attending: Gastroenterology

## 2014-08-09 ENCOUNTER — Encounter (HOSPITAL_COMMUNITY): Payer: Self-pay | Admitting: Certified Registered Nurse Anesthetist

## 2014-08-09 ENCOUNTER — Ambulatory Visit (HOSPITAL_COMMUNITY)
Admission: RE | Admit: 2014-08-09 | Discharge: 2014-08-09 | Disposition: A | Discharge status: Home / Self Care | Payer: PPO | Source: Ambulatory Visit | Attending: Gastroenterology | Admitting: Gastroenterology

## 2014-08-09 DIAGNOSIS — Z5309 Procedure and treatment not carried out because of other contraindication: Secondary | ICD-10-CM | POA: Diagnosis not present

## 2014-08-09 DIAGNOSIS — Z1211 Encounter for screening for malignant neoplasm of colon: Secondary | ICD-10-CM | POA: Diagnosis not present

## 2014-08-09 DIAGNOSIS — Z86010 Personal history of colonic polyps: Secondary | ICD-10-CM | POA: Insufficient documentation

## 2014-08-09 HISTORY — DX: Dependence on supplemental oxygen: Z99.81

## 2014-08-09 SURGERY — CANCELLED PROCEDURE

## 2014-08-09 MED ORDER — SODIUM CHLORIDE 0.9 % IV SOLN
INTRAVENOUS | Status: DC
Start: 2014-08-09 — End: 2014-08-09

## 2014-08-09 MED ORDER — SODIUM CHLORIDE 0.9 % IV SOLN: INTRAVENOUS | Status: DC

## 2014-08-09 MED ORDER — PROPOFOL 10 MG/ML IV BOLUS
INTRAVENOUS | Status: AC
  Filled 2014-08-09: qty 20

## 2014-08-09 SURGICAL SUPPLY — 22 items

## 2014-08-09 NOTE — OR Nursing (Signed)
Patient took 180mg  sq dose of lovenox at 2200 08/08/14.  Per council with Dr. Wynetta Emery, it had not been 24 hrs. Since last dose and pt. Was a bleed risk.  Pt. Asked to reschedule procedure at this time.    Laverta Baltimore, RN

## 2014-08-24 ENCOUNTER — Other Ambulatory Visit: Payer: Self-pay | Admitting: Gastroenterology

## 2014-10-20 ENCOUNTER — Encounter (HOSPITAL_COMMUNITY): Payer: Self-pay | Admitting: *Deleted

## 2014-11-01 ENCOUNTER — Encounter (HOSPITAL_COMMUNITY): Payer: Self-pay | Admitting: *Deleted

## 2014-11-01 ENCOUNTER — Encounter (HOSPITAL_COMMUNITY)
Admission: RE | Disposition: A | Discharge status: Home / Self Care | Payer: Self-pay | Source: Ambulatory Visit | Attending: Gastroenterology

## 2014-11-01 ENCOUNTER — Other Ambulatory Visit: Payer: Self-pay | Admitting: Nurse Practitioner

## 2014-11-01 ENCOUNTER — Other Ambulatory Visit: Payer: Self-pay | Admitting: Gastroenterology

## 2014-11-01 ENCOUNTER — Ambulatory Visit (HOSPITAL_COMMUNITY)
Admission: RE | Admit: 2014-11-01 | Discharge: 2014-11-01 | Disposition: A | Discharge status: Home / Self Care | Payer: PPO | Source: Ambulatory Visit | Attending: Gastroenterology | Admitting: Gastroenterology

## 2014-11-01 ENCOUNTER — Ambulatory Visit (HOSPITAL_COMMUNITY): Payer: PPO | Admitting: Anesthesiology

## 2014-11-01 ENCOUNTER — Ambulatory Visit
Admission: RE | Admit: 2014-11-01 | Discharge: 2014-11-01 | Disposition: A | Discharge status: Home / Self Care | Payer: PPO | Source: Ambulatory Visit | Attending: Nurse Practitioner | Admitting: Nurse Practitioner

## 2014-11-01 DIAGNOSIS — Z9981 Dependence on supplemental oxygen: Secondary | ICD-10-CM | POA: Diagnosis not present

## 2014-11-01 DIAGNOSIS — I10 Essential (primary) hypertension: Secondary | ICD-10-CM | POA: Insufficient documentation

## 2014-11-01 DIAGNOSIS — Z1211 Encounter for screening for malignant neoplasm of colon: Secondary | ICD-10-CM | POA: Insufficient documentation

## 2014-11-01 DIAGNOSIS — J449 Chronic obstructive pulmonary disease, unspecified: Secondary | ICD-10-CM | POA: Diagnosis not present

## 2014-11-01 DIAGNOSIS — R0602 Shortness of breath: Secondary | ICD-10-CM

## 2014-11-01 DIAGNOSIS — I4891 Unspecified atrial fibrillation: Secondary | ICD-10-CM | POA: Insufficient documentation

## 2014-11-01 DIAGNOSIS — Z5309 Procedure and treatment not carried out because of other contraindication: Secondary | ICD-10-CM | POA: Diagnosis not present

## 2014-11-01 DIAGNOSIS — G4730 Sleep apnea, unspecified: Secondary | ICD-10-CM | POA: Insufficient documentation

## 2014-11-01 HISTORY — DX: Personal history of other medical treatment: Z92.89

## 2014-11-01 SURGERY — COLONOSCOPY WITH PROPOFOL
Anesthesia: Monitor Anesthesia Care

## 2014-11-01 SURGERY — CANCELLED PROCEDURE
Anesthesia: Monitor Anesthesia Care

## 2014-11-01 MED ORDER — SODIUM CHLORIDE 0.9 % IV SOLN: INTRAVENOUS | Status: DC

## 2014-11-01 MED ORDER — PROPOFOL 10 MG/ML IV BOLUS
INTRAVENOUS | Status: AC
  Filled 2014-11-01: qty 20

## 2014-11-01 MED ORDER — ONDANSETRON HCL 4 MG/2ML IJ SOLN
INTRAMUSCULAR | Status: AC
  Filled 2014-11-01: qty 2

## 2014-11-01 MED ORDER — LACTATED RINGERS IV SOLN: INTRAVENOUS | Status: DC

## 2014-11-01 SURGICAL SUPPLY — 22 items

## 2014-11-01 NOTE — Anesthesia Preprocedure Evaluation (Deleted)
Anesthesia Evaluation  Patient identified by MRN, date of birth, ID band Patient awake    Reviewed: Allergy & Precautions, NPO status , Patient's Chart, lab work & pertinent test results  Airway Mallampati: II  TM Distance: >3 FB Neck ROM: Full    Dental no notable dental hx.    Pulmonary sleep apnea , COPD,  oxygen dependent,   History of pulmonary embolism  Multiple, status post IVC filter, on Coumadin     breath sounds clear to auscultation + decreased breath sounds      Cardiovascular hypertension, negative cardio ROS Normal cardiovascular exam Rhythm:Regular Rate:Normal     Neuro/Psych negative neurological ROS  negative psych ROS   GI/Hepatic negative GI ROS, Neg liver ROS,   Endo/Other  Morbid obesity  Renal/GU negative Renal ROS  negative genitourinary   Musculoskeletal negative musculoskeletal ROS (+)   Abdominal (+) + obese,   Peds negative pediatric ROS (+)  Hematology negative hematology ROS (+)   Anesthesia Other Findings   Reproductive/Obstetrics negative OB ROS                             Anesthesia Physical Anesthesia Plan  ASA: IV  Anesthesia Plan: MAC   Post-op Pain Management:    Induction: Intravenous  Airway Management Planned: Nasal Cannula  Additional Equipment:   Intra-op Plan:   Post-operative Plan: Extubation in OR  Informed Consent: I have reviewed the patients History and Physical, chart, labs and discussed the procedure including the risks, benefits and alternatives for the proposed anesthesia with the patient or authorized representative who has indicated his/her understanding and acceptance.   Dental advisory given  Plan Discussed with: CRNA and Surgeon  Anesthesia Plan Comments:         Anesthesia Quick Evaluation

## 2014-11-01 NOTE — Progress Notes (Signed)
Pt's procedure cancelled because she was in atrial fibrillation. Appointment made with Dr. Sherilyn Cooter office today at 1515. Pt aware and will bring copy of EKG to appointment. Brt, rn

## 2014-11-01 NOTE — Progress Notes (Signed)
Patient in new a-fib with RVR pulse 130's  cancelled

## 2014-11-02 ENCOUNTER — Other Ambulatory Visit: Payer: Self-pay

## 2014-11-02 ENCOUNTER — Other Ambulatory Visit (HOSPITAL_COMMUNITY): Payer: Self-pay | Admitting: Nurse Practitioner

## 2014-11-02 ENCOUNTER — Ambulatory Visit (HOSPITAL_COMMUNITY): Payer: PPO | Attending: Cardiovascular Disease

## 2014-11-02 DIAGNOSIS — I10 Essential (primary) hypertension: Secondary | ICD-10-CM | POA: Diagnosis not present

## 2014-11-02 DIAGNOSIS — I509 Heart failure, unspecified: Secondary | ICD-10-CM | POA: Insufficient documentation

## 2014-11-02 DIAGNOSIS — I5020 Unspecified systolic (congestive) heart failure: Secondary | ICD-10-CM | POA: Diagnosis not present

## 2014-11-02 DIAGNOSIS — I517 Cardiomegaly: Secondary | ICD-10-CM | POA: Diagnosis not present

## 2014-11-08 ENCOUNTER — Ambulatory Visit (INDEPENDENT_AMBULATORY_CARE_PROVIDER_SITE_OTHER): Payer: PPO | Admitting: Adult Health

## 2014-11-08 ENCOUNTER — Encounter: Payer: Self-pay | Admitting: Adult Health

## 2014-11-08 VITALS — BP 140/60 | HR 118 | Ht 67.0 in | Wt 397.6 lb

## 2014-11-08 DIAGNOSIS — I10 Essential (primary) hypertension: Secondary | ICD-10-CM

## 2014-11-08 NOTE — Patient Instructions (Signed)
Your physician recommends that you schedule a follow-up appointment with Dr. Rayann Heman in the [facility]  Your physician recommends that you continue on your current medications as directed. Please refer to the Current Medication list given to you today.  We will send a copy of today's notes to Dr. [name]  Thank you for choosing [facility]!

## 2014-11-08 NOTE — Progress Notes (Deleted)
Name: Rhonda Acevedo    DOB: 1950-05-09  Age: 64 y.o.  MR#: [id number]       PCP:  Wenda Low, MD      Insurance: Payor: [employer] / Plan: Tenn[employer] Must / Product Type: *No Product type* /   CC:   No chief complaint on file.   VS Filed Vitals:   11/08/14 1313  BP: 140/60  Pulse: 118  Height: '5\' 7"'  (1.702 m)  Weight: 397 lb 9.6 oz (180.35 kg)  SpO2: 97%    Weights Current Weight  11/08/14 397 lb 9.6 oz (180.35 kg)  02/10/14 406 lb 4 oz (184.274 kg)  11/09/13 404 lb (183.253 kg)    Blood Pressure  BP Readings from Last 3 Encounters:  11/08/14 140/60  02/10/14 118/78  11/09/13 110/80     Admit date:  (Not on file) Last encounter with RMR:  Visit date not found   Allergy Adhesive and Benazepril hcl  Current Outpatient Prescriptions  Medication Sig Dispense Refill  . Acetaminophen (TYLENOL ARTHRITIS PAIN PO) Take 2 tablets by mouth 2 (two) times daily as needed (pain).     Marland Kitchen albuterol (ACCUNEB) 1.25 MG/3ML nebulizer solution Take 1 ampule by nebulization every 6 (six) hours as needed for wheezing or shortness of breath.     Marland Kitchen albuterol (PROAIR HFA) 108 (90 BASE) MCG/ACT inhaler Inhale 2 puffs into the lungs 4 (four) times daily as needed for wheezing or shortness of breath.     . budesonide (PULMICORT FLEXHALER) 180 MCG/ACT inhaler 2 puffs then rinse mouth, twice daily- maintenance (Patient taking differently: Inhale 1 puff into the lungs 2 (two) times daily. ) 1 Inhaler prn  . Calcium Carbonate-Vitamin D (CALCIUM-VITAMIN D) 600-200 MG-UNIT CAPS Take 1 capsule by mouth daily.    . cetirizine (ZYRTEC) 10 MG tablet Take 10 mg by mouth daily.    . Cholecalciferol (VITAMIN D3) 2000 UNITS TABS Take 2,000 Units by mouth daily.     Marland Kitchen diltiazem (CARDIZEM CD) 300 MG 24 hr capsule Take 300 mg by mouth daily.    . famotidine (PEPCID) 20 MG tablet Take 40 mg by mouth 2 (two) times daily.     . fluticasone (FLONASE) 50 MCG/ACT nasal spray Place 1 spray into the nose 2  (two) times daily.     . Fluticasone-Salmeterol (ADVAIR) 500-50 MCG/DOSE AEPB Inhale 1 puff into the lungs every 12 (twelve) hours.    . furosemide (LASIX) 20 MG tablet Take 40 mg by mouth daily.     Marland Kitchen losartan (COZAAR) 100 MG tablet Take 50 mg by mouth at bedtime.     . metoprolol tartrate (LOPRESSOR) 25 MG tablet Take 25 mg by mouth 2 (two) times daily.    Marland Kitchen oxybutynin (DITROPAN) 5 MG tablet Take 5 mg by mouth 2 (two) times daily.    . OXYGEN-HELIUM IN Inhale 2 L into the lungs at bedtime.     . polyethylene glycol powder (GLYCOLAX/MIRALAX) powder Take 17 g by mouth at bedtime.     . pravastatin (PRAVACHOL) 80 MG tablet Take 80 mg by mouth at bedtime.     . traMADol (ULTRAM) 50 MG tablet Take 50 mg by mouth every 6 (six) hours as needed for moderate pain.     . vitamin C (ASCORBIC ACID) 500 MG tablet Take 500 mg by mouth daily.    Marland Kitchen warfarin (COUMADIN) 10 MG tablet Take 10 mg by mouth daily at 6 PM. Take one tablet daily except none of Friday  No current facility-administered medications for this visit.    Discontinued Meds:   There are no discontinued medications.  Patient Active Problem List   Diagnosis Date Noted  . Pulmonary embolism ([place]) 08/27/2013  . Exertional dyspnea 08/17/2013  . Chronic respiratory failure with hypoxia ([place]) 03/27/2010  . HYPERLIPIDEMIA 05/03/2007  . Essential hypertension, benign 05/03/2007  . Asthma, non-allergic 05/03/2007  . GERD 05/03/2007  . Obesity hypoventilation syndrome ([place]) 04/28/2007  . BRONCHITIS 04/28/2007  . Obstructive sleep apnea 04/28/2007    LABS    Component Value Date/Time   NA 139 08/27/2013 0533   NA 139 08/26/2013 2322   NA 139 08/17/2013 1628   K 3.9 08/27/2013 0533   K 3.9 08/26/2013 2322   K 3.9 08/17/2013 1628   CL 100 08/27/2013 0533   CL 99 08/26/2013 2322   CL 101 08/17/2013 1628   CO2 23 08/27/2013 0533   CO2 26 08/26/2013 2322   CO2 28 08/17/2013 1628   GLUCOSE 110* 08/27/2013 0533   GLUCOSE 108*  08/26/2013 2322   GLUCOSE 104* 08/17/2013 1628   BUN 15 08/27/2013 0533   BUN 14 08/26/2013 2322   BUN 17 08/17/2013 1628   CREATININE 1.05 08/27/2013 0533   CREATININE 0.98 08/26/2013 2322   CREATININE 0.93 08/17/2013 1628   CREATININE 1.18 12/11/2008 1624   CALCIUM 9.2 08/27/2013 0533   CALCIUM 9.3 08/26/2013 2322   CALCIUM 9.1 08/17/2013 1628   GFRNONAA 57* 08/27/2013 0533   GFRNONAA 61* 08/26/2013 2322   GFRNONAA 47* 12/11/2008 1624   GFRAA 66* 08/27/2013 0533   GFRAA 71* 08/26/2013 2322   GFRAA * 12/11/2008 1624    57        The eGFR has been calculated using the MDRD equation. This calculation has not been validated in all clinical situations. eGFR's persistently <60 mL/min signify possible Chronic Kidney Disease.   CMP     Component Value Date/Time   NA 139 08/27/2013 0533   K 3.9 08/27/2013 0533   CL 100 08/27/2013 0533   CO2 23 08/27/2013 0533   GLUCOSE 110* 08/27/2013 0533   BUN 15 08/27/2013 0533   CREATININE 1.05 08/27/2013 0533   CREATININE 0.93 08/17/2013 1628   CALCIUM 9.2 08/27/2013 0533   PROT 6.4 08/27/2013 0533   ALBUMIN 3.5 08/27/2013 0533   AST 22 08/27/2013 0533   ALT 26 08/27/2013 0533   ALKPHOS 71 08/27/2013 0533   BILITOT 0.2* 08/27/2013 0533   GFRNONAA 57* 08/27/2013 0533   GFRAA 66* 08/27/2013 0533       Component Value Date/Time   WBC 5.4 08/28/2013 0400   WBC 6.7 08/27/2013 0533   WBC 6.7 08/26/2013 2322   HGB 10.5* 08/28/2013 0400   HGB 11.7* 08/27/2013 0533   HGB 12.0 08/26/2013 2322   HCT 33.6* 08/28/2013 0400   HCT 36.5 08/27/2013 0533   HCT 38.1 08/26/2013 2322   MCV 95.7 08/28/2013 0400   MCV 94.8 08/27/2013 0533   MCV 95.5 08/26/2013 2322    Lipid Panel  No results found for: CHOL, TRIG, HDL, CHOLHDL, VLDL, LDLCALC, LDLDIRECT  ABG    Component Value Date/Time   TCO2 25 10/04/2007 2244     No results found for: TSH BNP (last 3 results) No results for input(s): BNP in the last 8760 hours.  ProBNP (last 3  results) No results for input(s): PROBNP in the last 8760 hours.  Cardiac Panel (last 3 results) No results for input(s): CKTOTAL, CKMB, TROPONINI, RELINDX in the  last 72 hours.  Iron/TIBC/Ferritin/ %Sat No results found for: IRON, TIBC, FERRITIN, IRONPCTSAT   EKG Orders placed or performed in visit on 11/08/14  . EKG 12-Lead     Prior Assessment and Plan Problem List as of 11/08/2014      Cardiovascular and Mediastinum   Essential hypertension, benign   Last Assessment & Plan 08/17/2013 Office Visit Written 08/17/2013  2:52 PM by Satira Sark, MD     On Cozaar and Cardizem CD.      Pulmonary embolism [facility])   Last Assessment & Plan 02/10/2014 Office Visit Written 02/11/2014  8:40 PM by Deneise Lever, MD    Not sure that her previous pulmonary embolism can be blamed for her current oxygen needs and diffusion capacity. Her body weight would be sufficient explanation.        Respiratory   BRONCHITIS   Last Assessment & Plan 03/27/2011 Office Visit Written 03/30/2011  9:53 PM by Deneise Lever, MD    We are giving samples of Benicar 20 mg #21 to take for 3 weeks instead of lisinopril. CHB ACE inhibitor is contributing to her upper airway complaints and cough.. She is divorced but she is not wheezing. Potential additional factors include the steroid and dry powder of her Advair, and her chronic reflux. Schedule PFT.      Asthma, non-allergic   Last Assessment & Plan 05/09/2011 Office Visit Edited 05/13/2011  9:26 PM by Deneise Lever, MD    Question if we can find a steroid inhaler that does not aggravate hoarseness. Plan-switch to [facility] 100 to get away from dry powder. If problem persists, consider spacer.      Obstructive sleep apnea   Last Assessment & Plan 02/10/2014 Office Visit Written 02/11/2014  8:38 PM by Deneise Lever, MD    She has not been able to lose weight which would make the most difference. Plan-now that she has changed to Advanced we will request download  for pressure compliance      Chronic respiratory failure with hypoxia [facility])   Last Assessment & Plan 11/09/2013 Office Visit Written 11/10/2013  9:30 PM by Deneise Lever, MD    Plan: Flu vaccine, continue oxygen at 3 L        Digestive   GERD     Other   HYPERLIPIDEMIA   Last Assessment & Plan 08/17/2013 Office Visit Written 08/17/2013  2:51 PM by Satira Sark, MD    On Pravachol, followed by Dr. Inda Merlin.      Obesity hypoventilation syndrome [facility])   Last Assessment & Plan 02/10/2014 Office Visit Written 02/11/2014  8:40 PM by Deneise Lever, MD    Evident on visual examination. I discussed impact of her weight on diaphragm movement and lung volumes as reflected in her PFTs.      Exertional dyspnea   Last Assessment & Plan 02/10/2014 Office Visit Written 02/11/2014  8:41 PM by Deneise Lever, MD    Lung function is not severely reduced and I expect the biggest problem for her exertional dyspnea comes from her obesity and deconditioning.          Imaging: Dg Chest 2 View  11/01/2014  CLINICAL DATA:  Shortness of breath. EXAM: CHEST  2 VIEW COMPARISON:  None. FINDINGS: Mediastinum and hilar structures normal. Cardiomegaly with mild pulmonary venous congestion. Low lung volumes with mild basilar atelectasis. No pleural effusion or pneumothorax. Degenerative changes thoracic spine. IMPRESSION: 1.  Cardiomegaly is mild pulmonary venous  congestion. 2.  Low lung volumes with mild basilar atelectasis. Electronically Signed   By: Marcello Moores  Register   On: 11/01/2014 16:15

## 2014-11-08 NOTE — Progress Notes (Signed)
Cardiology Office Note   Date:  11/08/2014   ID:  Rhonda Acevedo, DOB 06-25-1950, MRN [id number]  PCP:  Wenda Low, MD  Cardiologist:  [place]/ Jory Sims, NP   Chief Complaint  Patient presents with  . Atrial Fibrillation      History of Present Illness: Rhonda Acevedo is a 64 y.o. female who presents for ongoing assessment and management of hypertension, PE on chronic coumadin therapy, with normal cardiac cath on 08/20/2013. She was recently hospitalized after having colonoscopy and found to have atrial fib with RVR. Heart rate 130's-150's. Colonoscopy was canceled. She was also found to have CHF. She was seen by [facility] on 11/04/2014 who are managing coumadin therapy. She is here to be re-established with cardiology as she has not been seen since 2015. Other history is of OSA, on CPAP, and COPD followed by Dr. Annamaria Boots.   Echocardiogram dated 11/02/2014: Left ventricle: The cavity size was normal. Wall thickness was normal. Systolic function was normal. The estimated ejection fraction was in the range of 55% to 60%. Wall motion was normal; there were no regional wall motion abnormalities. - Left atrium: The atrium was mildly to moderately dilated. - Right atrium: The atrium was mildly dilated.  She states that she is feeling tired and weak. When she gets up to walk she feels her HR go up to 120-130 by her pulse ox. She states she can feel it when she lies down at night as well.   Past Medical History  Diagnosis Date  . Essential hypertension, benign   . Mixed hyperlipidemia   . Esophageal reflux   . COPD (chronic obstructive pulmonary disease) ([place])   . History of pulmonary embolism     Multiple, status post IVC filter, on Coumadin  . History of DVT (deep vein thrombosis)   . GERD (gastroesophageal reflux disease)   . Osteoarthritis   . Factor V Leiden mutation ([place])   . Allergic rhinitis   . Complication of anesthesia     pt woke up, pt heard staff  member say she was not breathing and woke up  ok afterwards, during hernia repair  . On home oxygen therapy     2 liters at night  . Transfusion history   . Obstructive sleep apnea     NPSG 05-29-07; AHI 32.9,cpap 7 to 9    Past Surgical History  Procedure Laterality Date  . Ivc filter    . Ganglion cyst excision    . Hernia abscess    . I&d hernia incisional abscess    . Endometrial ablation    . Rotator cuff repair    . Left heart catheterization with coronary angiogram N/A 08/20/2013    Procedure: LEFT HEART CATHETERIZATION WITH CORONARY ANGIOGRAM;  Surgeon: Blane Ohara, MD;  Location[facility] C[facility] LAB;  Service: Cardiovascular;  Laterality: N/A;  . Hernia repair      umbilical  . Cholecystectomy       Current Outpatient Prescriptions  Medication Sig Dispense Refill  . Acetaminophen (TYLENOL ARTHRITIS PAIN PO) Take 2 tablets by mouth 2 (two) times daily as needed (pain).     Marland Kitchen albuterol (ACCUNEB) 1.25 MG/3ML nebulizer solution Take 1 ampule by nebulization every 6 (six) hours as needed for wheezing or shortness of breath.     Marland Kitchen albuterol (PROAIR HFA) 108 (90 BASE) MCG/ACT inhaler Inhale 2 puffs into the lungs 4 (four) times daily as needed for wheezing or shortness of breath.     . budesonide (  PULMICORT FLEXHALER) 180 MCG/ACT inhaler 2 puffs then rinse mouth, twice daily- maintenance (Patient taking differently: Inhale 1 puff into the lungs 2 (two) times daily. ) 1 Inhaler prn  . Calcium Carbonate-Vitamin D (CALCIUM-VITAMIN D) 600-200 MG-UNIT CAPS Take 1 capsule by mouth daily.    . cetirizine (ZYRTEC) 10 MG tablet Take 10 mg by mouth daily.    . Cholecalciferol (VITAMIN D3) 2000 UNITS TABS Take 2,000 Units by mouth daily.     Marland Kitchen diltiazem (CARDIZEM CD) 300 MG 24 hr capsule Take 300 mg by mouth daily.    . famotidine (PEPCID) 20 MG tablet Take 40 mg by mouth 2 (two) times daily.     . fluticasone (FLONASE) 50 MCG/ACT nasal spray Place 1 spray into the nose 2 (two) times daily.      . Fluticasone-Salmeterol (ADVAIR) 500-50 MCG/DOSE AEPB Inhale 1 puff into the lungs every 12 (twelve) hours.    . furosemide (LASIX) 20 MG tablet Take 40 mg by mouth daily.     Marland Kitchen losartan (COZAAR) 100 MG tablet Take 50 mg by mouth at bedtime.     . metoprolol tartrate (LOPRESSOR) 25 MG tablet Take 25 mg by mouth 2 (two) times daily.    Marland Kitchen oxybutynin (DITROPAN) 5 MG tablet Take 5 mg by mouth 2 (two) times daily.    . OXYGEN-HELIUM IN Inhale 2 L into the lungs at bedtime.     . polyethylene glycol powder (GLYCOLAX/MIRALAX) powder Take 17 g by mouth at bedtime.     . pravastatin (PRAVACHOL) 80 MG tablet Take 80 mg by mouth at bedtime.     . traMADol (ULTRAM) 50 MG tablet Take 50 mg by mouth every 6 (six) hours as needed for moderate pain.     . vitamin C (ASCORBIC ACID) 500 MG tablet Take 500 mg by mouth daily.    Marland Kitchen warfarin (COUMADIN) 10 MG tablet Take 10 mg by mouth daily at 6 PM. Take one tablet daily except none of Friday     No current facility-administered medications for this visit.    Allergies:   Adhesive and Benazepril hcl    Social History:  The patient  reports that she has never smoked. She has never used smokeless tobacco. She reports that she does not drink alcohol or use illicit drugs.   Family History:  The patient's family history includes Allergies in an other family member; Asthma in an other family member; COPD in an other family member; Cancer in her mother; Heart disease in her father.    ROS: All other systems are reviewed and negative. Unless otherwise mentioned in H&P    PHYSICAL EXAM: VS:  BP 140/60 mmHg  Pulse 118  Ht 5\' 7"  (1.702 m)  Wt 397 lb 9.6 oz (180.35 kg)  BMI 62.26 kg/m2  SpO2 97% , BMI Body mass index is 62.26 kg/(m^2). GEN: Well nourished, well developed, in no acute distress HEENT: normal Neck: no JVD, carotid bruits, or masses Cardiac: IRRR; no murmurs, rubs, or gallops,no edema  Respiratory:  clear to auscultation bilaterally, normal work  of breathing GI: soft, nontender, nondistended, + BS MS: no deformity or atrophy Skin: warm and dry, no rash Neuro:  Strength and sensation are intact Psych: euthymic mood, full affect   EKG: Atrial fib rate of 77 bpm.    Lipid Panel No results found for: CHOL, TRIG, HDL, CHOLHDL, VLDL, LDLCALC, LDLDIRECT    Wt Readings from Last 3 Encounters:  11/08/14 397 lb 9.6 oz (180.35 kg)  02/10/14 406 lb 4 oz (184.274 kg)  11/09/13 404 lb (183.253 kg)        ASSESSMENT AND PLAN:  1. Atrial fib: Uncertain duration. She began to feel weak and tired about a month ago. The colonoscopy pre-evaluation per monitor demonstrated atrial fib with RVR. Records are confirmed by copies of the telemetry. She was placed on metoprolol 25 mg BID and continued on diltiazem 300 mg daily. She has already been anticoagulated for long period of time with hx of DVT on coumadin.  I discussed with her the need to continue medical therapy vs DCCV. She is wiling to move forward with DCCV. I discussed this with Dr. Bronson Ing on site. He recommended waiting to have DCCV as her weight and lung function may make it more difficult for sedation. He recommended that we send her to Atrial fib clinic in [place] for recommendations.   It was also recommended that we start digoxin 0.125 mg daily. The patient refused as she states her HR is in the low 40;s at home via her pulse oximeter. This may be inaccurate with atrial fib, but I have respected her wishes. She will been seen in afib clinic and then followed up with cardiology by Dr. Domenic Polite.   2. COPD: Followed by Dr. Annamaria Boots with pulmonology.   3. OSA: Could certainly be contributing to atrial fib but she is using CPAP.   4. Hypertension: BP is controlled. I checked it again manually. 126/78. Continue all medications.    Current medicines are reviewed at length with the patient today.    Labs/ tests ordered today include: None  Orders Placed This Encounter   Procedures  . EKG 12-Lead     Disposition:   FU with Atrial fibrillation clinic.  Signed, Jory Sims, NP  11/08/2014 3:00 PM    [place]. [address] [place], [place] [address] Phone: [phone]; Fax: [phone]

## 2014-11-15 ENCOUNTER — Encounter (HOSPITAL_COMMUNITY): Payer: Self-pay | Admitting: Nurse Practitioner

## 2014-11-15 ENCOUNTER — Ambulatory Visit (HOSPITAL_COMMUNITY)
Admission: RE | Admit: 2014-11-15 | Discharge: 2014-11-15 | Disposition: A | Discharge status: Home / Self Care | Payer: PPO | Source: Ambulatory Visit | Attending: Nurse Practitioner | Admitting: Nurse Practitioner

## 2014-11-15 VITALS — BP 108/62 | HR 54 | Ht 67.0 in | Wt >= 6400 oz

## 2014-11-15 DIAGNOSIS — I480 Paroxysmal atrial fibrillation: Secondary | ICD-10-CM | POA: Diagnosis present

## 2014-11-15 DIAGNOSIS — I10 Essential (primary) hypertension: Secondary | ICD-10-CM | POA: Diagnosis not present

## 2014-11-15 DIAGNOSIS — G4733 Obstructive sleep apnea (adult) (pediatric): Secondary | ICD-10-CM | POA: Diagnosis not present

## 2014-11-15 MED ORDER — METOPROLOL TARTRATE 25 MG PO TABS: 12.5000 mg | ORAL_TABLET | Freq: Two times a day (BID) | ORAL | Status: DC

## 2014-11-15 MED ORDER — DRONEDARONE HCL 400 MG PO TABS: 400.0000 mg | ORAL_TABLET | Freq: Two times a day (BID) | ORAL | Status: DC

## 2014-11-15 NOTE — Patient Instructions (Signed)
Your physician has recommended you make the following change in your medication:  1)multaq 400mg  twice a day with food 2)Decrease metoprolol to 1/2 tablet twice a day (12.5mg  twice daily)

## 2014-11-15 NOTE — Progress Notes (Signed)
Patient ID: Rhonda Acevedo, female   DOB: Jan 17, 1951, 64 y.o.   MRN: [id number]     Primary Care Physician: Wenda Low, MD Referring Physician: Jory Sims, NP   Rhonda Acevedo is a 64 y.o. female with a h/o morbid obesity, HTN, COPD, OSA, new onset afib that was scheduled for an colonoscopy and found to be in new onset afib with v rate 150 bpm. She was already on cardizem and recently started on metoprolol. She is on coumadin chronically with h/o factor v leiden mutation with h/o DVT PE.  Ekg shows that pt is now in Sinus brady at 59 bpm. Pt feels better today, will notice more shortness of breath when in afib. Is using Cpap . No alcohol use, or excessive caffeine use. No exercise.  Today, she denies symptoms of palpitations, chest pain,  has some chronic shortness of breath,  no orthopnea, PND, lower extremity edema, dizziness, presyncope, syncope, or neurologic sequela. The patient is tolerating medications without difficulties and is otherwise without complaint today.   Past Medical History  Diagnosis Date  . Essential hypertension, benign   . Mixed hyperlipidemia   . Esophageal reflux   . COPD (chronic obstructive pulmonary disease) ([place])   . History of pulmonary embolism     Multiple, status post IVC filter, on Coumadin  . History of DVT (deep vein thrombosis)   . GERD (gastroesophageal reflux disease)   . Osteoarthritis   . Factor V Leiden mutation ([place])   . Allergic rhinitis   . Complication of anesthesia     pt woke up, pt heard staff member say she was not breathing and woke up  ok afterwards, during hernia repair  . On home oxygen therapy     2 liters at night  . Transfusion history   . Obstructive sleep apnea     NPSG 05-29-07; AHI 32.9,cpap 7 to 9   Past Surgical History  Procedure Laterality Date  . Ivc filter    . Ganglion cyst excision    . Hernia abscess    . I&d hernia incisional abscess    . Endometrial ablation    . Rotator cuff repair    . Left  heart catheterization with coronary angiogram N/A 08/20/2013    Procedure: LEFT HEART CATHETERIZATION WITH CORONARY ANGIOGRAM;  Surgeon: Blane Ohara, MD;  Location: [facility] CATH LAB;  Service: Cardiovascular;  Laterality: N/A;  . Hernia repair      umbilical  . Cholecystectomy      Current Outpatient Prescriptions  Medication Sig Dispense Refill  . Acetaminophen (TYLENOL ARTHRITIS PAIN PO) Take 2 tablets by mouth 2 (two) times daily as needed (pain).     Marland Kitchen albuterol (ACCUNEB) 1.25 MG/3ML nebulizer solution Take 1 ampule by nebulization every 6 (six) hours as needed for wheezing or shortness of breath.     Marland Kitchen albuterol (PROAIR HFA) 108 (90 BASE) MCG/ACT inhaler Inhale 2 puffs into the lungs 4 (four) times daily as needed for wheezing or shortness of breath.     . budesonide (PULMICORT FLEXHALER) 180 MCG/ACT inhaler 2 puffs then rinse mouth, twice daily- maintenance (Patient taking differently: Inhale 1 puff into the lungs 2 (two) times daily. ) 1 Inhaler prn  . Calcium Carbonate-Vitamin D (CALCIUM-VITAMIN D) 600-200 MG-UNIT CAPS Take 1 capsule by mouth daily.    . cetirizine (ZYRTEC) 10 MG tablet Take 10 mg by mouth daily.    . Cholecalciferol (VITAMIN D3) 2000 UNITS TABS Take 2,000 Units by mouth daily.     Marland Kitchen  diltiazem (CARDIZEM CD) 300 MG 24 hr capsule Take 300 mg by mouth daily.    . famotidine (PEPCID) 20 MG tablet Take 40 mg by mouth 2 (two) times daily.     . fluticasone (FLONASE) 50 MCG/ACT nasal spray Place 1 spray into the nose 2 (two) times daily.     . Fluticasone-Salmeterol (ADVAIR) 500-50 MCG/DOSE AEPB Inhale 1 puff into the lungs every 12 (twelve) hours.    . furosemide (LASIX) 20 MG tablet Take 40 mg by mouth daily.     Marland Kitchen losartan (COZAAR) 100 MG tablet Take 50 mg by mouth at bedtime.     . metoprolol tartrate (LOPRESSOR) 25 MG tablet Take 0.5 tablets (12.5 mg total) by mouth 2 (two) times daily.    Marland Kitchen oxybutynin (DITROPAN) 5 MG tablet Take 5 mg by mouth 2 (two) times daily.    .  OXYGEN-HELIUM IN Inhale 2 L into the lungs at bedtime.     . polyethylene glycol powder (GLYCOLAX/MIRALAX) powder Take 17 g by mouth at bedtime.     . pravastatin (PRAVACHOL) 80 MG tablet Take 80 mg by mouth at bedtime.     . traMADol (ULTRAM) 50 MG tablet Take 50 mg by mouth every 6 (six) hours as needed for moderate pain.     . vitamin C (ASCORBIC ACID) 500 MG tablet Take 500 mg by mouth daily.    Marland Kitchen warfarin (COUMADIN) 10 MG tablet Take 10 mg by mouth daily at 6 PM. Take one tablet daily except none of Friday    . dronedarone (MULTAQ) 400 MG tablet Take 1 tablet (400 mg total) by mouth 2 (two) times daily with a meal. 60 tablet 3   No current facility-administered medications for this encounter.    Allergies  Allergen Reactions  . Adhesive [Tape] Itching and Rash  . Benazepril Hcl Cough    Social History   Social History  . Marital Status: Married    Spouse Name: N/A  . Number of Children: N/A  . Years of Education: N/A   Occupational History  . Disability    Social History Main Topics  . Smoking status: Never Smoker   . Smokeless tobacco: Never Used     Comment: only as a teen for 6 months  . Alcohol Use: No  . Drug Use: No  . Sexual Activity: Not on file   Other Topics Concern  . Not on file   Social History Narrative   Widowed-cirrhosis    Family History  Problem Relation Age of Onset  . Allergies    . Cancer Mother     Thyroid cancer  . Asthma    . Heart disease Father     Heart attack  . COPD      ROS- All systems are reviewed and negative except as per the HPI above  Physical Exam: Filed Vitals:   11/15/14 1034  BP: 108/62  Pulse: 54  Height: 5\' 7"  (1.702 m)  Weight: 403 lb 3.2 oz (182.89 kg)    GEN- The patient is well appearing, alert and oriented x 3 today.   Head- normocephalic, atraumatic Eyes-  Sclera clear, conjunctiva pink Ears- hearing intact Oropharynx- clear Neck- supple, no JVP Lymph- no cervical lymphadenopathy Lungs- Clear  to ausculation bilaterally, normal work of breathing Heart- Regular rate and rhythm, no murmurs, rubs or gallops, PMI not laterally displaced GI- soft, NT, ND, + BS Extremities- no clubbing, cyanosis, or edema MS- no significant deformity or atrophy Skin- no rash  or lesion Psych- euthymic mood, full affect Neuro- strength and sensation are intact  EKG- Sinus brady,pr int 162 ms, QRS int 106 ms, Qtc 440 ms. V rate 54 bpm  Assessment and Plan: 1. New onset PAF Today in SR Discussed options to encourage SR and pt decided to try multaq 400 mg bid  She will cut metoprolol in half to prevent bradycardia but continue cardizem Return in one week for repeat EKG/ liver panel  2. OSA Use cpap  3. HTN Stable  4. Morbid obesity Weight loss encourgaed    F/u in one week  Butch Penny C. Leon Montoya, [facility] [address] [place],  [address] [phone]

## 2014-11-21 ENCOUNTER — Ambulatory Visit (HOSPITAL_COMMUNITY)
Admission: RE | Admit: 2014-11-21 | Discharge: 2014-11-21 | Disposition: A | Discharge status: Home / Self Care | Payer: PPO | Source: Ambulatory Visit | Attending: Nurse Practitioner | Admitting: Nurse Practitioner

## 2014-11-21 DIAGNOSIS — I480 Paroxysmal atrial fibrillation: Secondary | ICD-10-CM | POA: Diagnosis not present

## 2014-11-21 DIAGNOSIS — I4891 Unspecified atrial fibrillation: Secondary | ICD-10-CM | POA: Diagnosis present

## 2014-11-21 LAB — HEPATIC FUNCTION PANEL
ALBUMIN: 3.4 g/dL — AB (ref 3.5–5.0)
ALT: 15 U/L (ref 14–54)
AST: 23 U/L (ref 15–41)
Alkaline Phosphatase: 88 U/L (ref 38–126)
BILIRUBIN TOTAL: 1 mg/dL (ref 0.3–1.2)
Bilirubin, Direct: 0.3 mg/dL (ref 0.1–0.5)
Indirect Bilirubin: 0.7 mg/dL (ref 0.3–0.9)
TOTAL PROTEIN: 7.1 g/dL (ref 6.5–8.1)

## 2014-11-21 NOTE — Progress Notes (Addendum)
Patient in for follow up ekg and labwork after initiation of multaq. Roderic Palau NP to review EKG.  Started on multaq, here for f/u EKG. EKG intervals are normal with pr int at 160 ms, QRs int 102 ms, Qtc 446 ms. Feels well on multaq and no more awareness of afib but she is not happy with the price of $80 a month. Wants something cheaper. She has a f/u with Dr. Domenic Polite 11/14 and can discuss further. Possibly can use flecainide. She had a normal cath last year and no evidence of LVH by echo. Would allow wash out of multaq and then start 50 mg bid with f/u EKG in one week.

## 2014-11-22 ENCOUNTER — Ambulatory Visit: Payer: PPO | Admitting: Cardiology

## 2014-12-05 ENCOUNTER — Ambulatory Visit (INDEPENDENT_AMBULATORY_CARE_PROVIDER_SITE_OTHER): Payer: PPO | Admitting: Cardiology

## 2014-12-05 ENCOUNTER — Encounter: Payer: Self-pay | Admitting: Cardiology

## 2014-12-05 VITALS — BP 110/50 | HR 52 | Ht 67.0 in | Wt >= 6400 oz

## 2014-12-05 DIAGNOSIS — D6859 Other primary thrombophilia: Secondary | ICD-10-CM

## 2014-12-05 DIAGNOSIS — I480 Paroxysmal atrial fibrillation: Secondary | ICD-10-CM | POA: Diagnosis not present

## 2014-12-05 DIAGNOSIS — I10 Essential (primary) hypertension: Secondary | ICD-10-CM | POA: Diagnosis not present

## 2014-12-05 MED ORDER — FLECAINIDE ACETATE 50 MG PO TABS: 50.0000 mg | ORAL_TABLET | Freq: Two times a day (BID) | ORAL | Status: DC

## 2014-12-05 NOTE — Progress Notes (Signed)
Cardiology Office Note  Date: 12/05/2014   ID: Rhonda Acevedo, DOB [birth year], MRN [id number]  PCP: Wenda Low, MD  Primary Cardiologist: Rozann Lesches, MD   Chief Complaint  Patient presents with  . Atrial Fibrillation    History of Present Illness: Rhonda Acevedo is a 64 y.o. female seen recently in the office by Ms. Lawrence NP. I reviewed her interval history since evaluation in 2015. She was diagnosed with persistent atrial fibrillation of uncertain duration in October in the setting of plans for elective colonoscopy. She was placed on low dose metoprolol in addition to standing Cardizem CD by primary care, and referred back to our office. When she was seen by Ms. Lawrence NP, case was discussed with Dr. Bronson Ing with decision to have the patient evaluated in the atrial fibrillation clinic. She was seen by Ms. Carroll NP on October 25 at which time was documented that the patient was in sinus bradycardia. She was started on Multaq at that time.  She comes in today with her daughter for a follow-up visit. She does not report any sense of palpitations. She tells me that she does not notice a marked difference now that she is in sinus rhythm, compared to when she was in atrial fibrillation. She does have concerns about the high cost of Multaq, would like to consider a different medication.  Otherwise she continues on Coumadin as before, reports no bleeding problems. INR is followed by her primary care provider.  I reviewed her most recent tracings, most recent was on October 31.   Past Medical History  Diagnosis Date  . Essential hypertension, benign   . Mixed hyperlipidemia   . Esophageal reflux   . COPD (chronic obstructive pulmonary disease) ([place])   . History of pulmonary embolism     Multiple, status post IVC filter, on Coumadin  . History of DVT (deep vein thrombosis)   . GERD (gastroesophageal reflux disease)   . Osteoarthritis   . Factor V Leiden mutation  ([place])   . Allergic rhinitis   . On home oxygen therapy     2 liters at night  . Transfusion history   . Obstructive sleep apnea     NPSG 05-29-07; AHI 32.9,cpap 7 to 9  . History of cardiac catheterization     Normal coronaries July 2015  . Persistent atrial fibrillation [facility])     Documented October 2016    Past Surgical History  Procedure Laterality Date  . Ivc filter    . Ganglion cyst excision    . Hernia abscess    . I&d hernia incisional abscess    . Endometrial ablation    . Rotator cuff repair    . Left heart catheterization with coronary angiogram N/A 08/20/2013    Procedure: LEFT HEART CATHETERIZATION WITH CORONARY ANGIOGRAM;  Surgeon: Blane Ohara, MD;  Location: [facility] CATH LAB;  Service: Cardiovascular;  Laterality: N/A;  . Hernia repair      umbilical  . Cholecystectomy      Current Outpatient Prescriptions  Medication Sig Dispense Refill  . albuterol (ACCUNEB) 1.25 MG/3ML nebulizer solution Take 1 ampule by nebulization every 6 (six) hours as needed for wheezing or shortness of breath.     Marland Kitchen albuterol (PROAIR HFA) 108 (90 BASE) MCG/ACT inhaler Inhale 2 puffs into the lungs 4 (four) times daily as needed for wheezing or shortness of breath.     . budesonide (PULMICORT FLEXHALER) 180 MCG/ACT inhaler 2 puffs then rinse mouth, twice daily-  maintenance (Patient taking differently: Inhale 1 puff into the lungs 2 (two) times daily. ) 1 Inhaler prn  . Calcium Carbonate-Vitamin D (CALCIUM-VITAMIN D) 600-200 MG-UNIT CAPS Take 1 capsule by mouth daily.    . cetirizine (ZYRTEC) 10 MG tablet Take 10 mg by mouth daily.    . Cholecalciferol (VITAMIN D3) 2000 UNITS TABS Take 2,000 Units by mouth daily.     Marland Kitchen diltiazem (CARDIZEM CD) 300 MG 24 hr capsule Take 300 mg by mouth daily.    . famotidine (PEPCID) 20 MG tablet Take 40 mg by mouth 2 (two) times daily.     . fluticasone (FLONASE) 50 MCG/ACT nasal spray Place 1 spray into the nose 2 (two) times daily.     .  Fluticasone-Salmeterol (ADVAIR) 500-50 MCG/DOSE AEPB Inhale 1 puff into the lungs every 12 (twelve) hours.    . furosemide (LASIX) 20 MG tablet Take 40 mg by mouth daily.     Marland Kitchen losartan (COZAAR) 100 MG tablet Take 50 mg by mouth at bedtime.     . metoprolol tartrate (LOPRESSOR) 25 MG tablet Take 0.5 tablets (12.5 mg total) by mouth 2 (two) times daily.    Marland Kitchen oxybutynin (DITROPAN) 5 MG tablet Take 5 mg by mouth 2 (two) times daily.    . OXYGEN-HELIUM IN Inhale 2 L into the lungs at bedtime.     . polyethylene glycol powder (GLYCOLAX/MIRALAX) powder Take 17 g by mouth at bedtime.     . pravastatin (PRAVACHOL) 80 MG tablet Take 80 mg by mouth at bedtime.     . traMADol (ULTRAM) 50 MG tablet Take 50 mg by mouth every 6 (six) hours as needed for moderate pain.     . vitamin C (ASCORBIC ACID) 500 MG tablet Take 500 mg by mouth daily.    Marland Kitchen warfarin (COUMADIN) 10 MG tablet Take 10 mg by mouth daily at 6 PM. Take one tablet daily except none of Friday    . Acetaminophen (TYLENOL ARTHRITIS PAIN PO) Take 2 tablets by mouth 2 (two) times daily as needed (pain).     . flecainide (TAMBOCOR) 50 MG tablet Take 1 tablet (50 mg total) by mouth 2 (two) times daily. 180 tablet 3   No current facility-administered medications for this visit.    Allergies:  Adhesive and Benazepril hcl   Social History: The patient  reports that she has never smoked. She has never used smokeless tobacco. She reports that she does not drink alcohol or use illicit drugs.   Family History: The patient's family history includes Allergies in an other family member; Asthma in an other family member; COPD in an other family member; Cancer in her mother; Heart disease in her father.   ROS:  Please see the history of present illness. Otherwise, complete review of systems is positive for chronic shortness of breath.  All other systems are reviewed and negative.   Physical Exam: VS:  BP 110/50 mmHg  Pulse 52  Ht 5\' 7"  (1.702 m)  Wt 400 lb  (181.439 kg)  BMI 62.63 kg/m2  SpO2 98%, BMI Body mass index is 62.63 kg/(m^2).  Wt Readings from Last 3 Encounters:  12/05/14 400 lb (181.439 kg)  11/15/14 403 lb 3.2 oz (182.89 kg)  11/08/14 397 lb 9.6 oz (180.35 kg)     Morbidly obese woman, appears comfortable at rest. HEENT: Conjunctiva and lids normal, oropharynx clear. Neck: Supple, no elevated JVP or carotid bruits, no thyromegaly. Lungs: Decreased breath sounds without wheezing, nonlabored breathing at  rest. Cardiac: Distant, regular rate and rhythm, no S3, soft systolic murmur, no pericardial rub. Abdomen: Morbidly obese with pannus, nontender, bowel sounds present, no guarding or rebound. Extremities: Chronic appearing edema is mild with associated adipose tissue, distal pulses 2+. Skin: Warm and dry. Musculoskeletal: No kyphosis. Neuropsychiatric: Alert and oriented x3, affect grossly appropriate.   ECG: Tracing from 11/21/2014 showed sinus bradycardia with Q in lead III, low voltage.  Recent Labwork: 11/21/2014: ALT 15; AST 23   Other Studies Reviewed Today:  Echocardiogram 11/02/2014: Study Conclusions  - Left ventricle: The cavity size was normal. Wall thickness was normal. Systolic function was normal. The estimated ejection fraction was in the range of 55% to 60%. Wall motion was normal; there were no regional wall motion abnormalities. - Left atrium: The atrium was mildly to moderately dilated. - Right atrium: The atrium was mildly dilated.   ASSESSMENT AND PLAN:  1. Recent episode of persistent atrial fibrillation, onset uncertain. She is maintaining sinus rhythm on examination at this time, heart rate in the 50s on current regimen. CHADSVASC score is 2. She has been on Coumadin long-term with history of DVT/PE, and factor V Leiden mutation. After discussion our plan is to stop Multaq given her concerns about the cost, after washout will start flecainide 50 mg twice daily and then have her follow-up  for an ECG one week from there. No other changes made to present regimen, although may consider stopping Lopressor altogether and use this only as needed.  2. History of normal coronary arteries documented at cardiac catheterization in July 2015.  3. Morbid obesity with obstructive sleep apnea, followed by Pulmonary.  4. Essential hypertension, blood pressure is normal today.  Current medicines were reviewed at length with the patient today.  Disposition: FU with me in 1 month.   Signed, Satira Sark, MD, [facility] 12/05/2014 4:37 PM    [place] at [facility] [address], [place], [place] [address] Phone: [phone]; Fax: [phone]

## 2014-12-05 NOTE — Patient Instructions (Addendum)
Your physician recommends that you schedule a follow-up appointment in: 2 weeks with nurse for EKG   Follow up 1 month with Dr Rutherford Limerick Multaq , after 1 week start FLECAINIDE 50 mg twice a day (12/12/15)     If you need a refill on your cardiac medications before your next appointment, please call your pharmacy.     Thank you for choosing [facility] !

## 2014-12-19 ENCOUNTER — Ambulatory Visit (INDEPENDENT_AMBULATORY_CARE_PROVIDER_SITE_OTHER): Payer: PPO

## 2014-12-19 VITALS — HR 79

## 2014-12-19 DIAGNOSIS — I480 Paroxysmal atrial fibrillation: Secondary | ICD-10-CM | POA: Diagnosis not present

## 2014-12-19 DIAGNOSIS — Z79899 Other long term (current) drug therapy: Secondary | ICD-10-CM

## 2014-12-19 NOTE — Progress Notes (Signed)
EKG obtained after 1 week of flecainide use,will message Dr Domenic Polite   EKG in [facility]

## 2014-12-19 NOTE — Patient Instructions (Signed)
I will call you if there ear any changes

## 2014-12-19 NOTE — Progress Notes (Signed)
Review tracing. Sinus bradycardia with decreased R wave progression. Normal QRS duration 92 ms. No changes for now.

## 2014-12-30 ENCOUNTER — Encounter: Payer: Self-pay | Admitting: Cardiology

## 2014-12-30 ENCOUNTER — Ambulatory Visit (INDEPENDENT_AMBULATORY_CARE_PROVIDER_SITE_OTHER): Payer: PPO | Admitting: Cardiology

## 2014-12-30 VITALS — BP 100/60 | HR 54 | Ht 67.0 in | Wt 399.0 lb

## 2014-12-30 DIAGNOSIS — I10 Essential (primary) hypertension: Secondary | ICD-10-CM | POA: Diagnosis not present

## 2014-12-30 DIAGNOSIS — I480 Paroxysmal atrial fibrillation: Secondary | ICD-10-CM

## 2014-12-30 NOTE — Progress Notes (Signed)
Cardiology Office Note  Date: 12/30/2014   ID: MARLOWE CROSS, DOB 1950-11-27, MRN [id number]  PCP: Wenda Low, MD  Primary Cardiologist: Rozann Lesches, MD   Chief Complaint  Patient presents with  . Atrial Fibrillation   History of Present Illness: Rhonda Acevedo is a 64 y.o. female last seen in November. At that time we switched her from Multaq to flecainide as outlined in the previous note. Follow-up ECG reviewed with normal QRS 92 ms. she presents today with her daughter for a follow-up visit. Still doing well, no palpitations or chest pain.  We reviewed her medications which are outlined below. Heart rate is in the low 50s at rest. We discussed stopping Lopressor and continuing the Cardizem CD at current dose.  She reports no bleeding problems on Coumadin, which she has been on long-term prior to her diagnosis of atrial fibrillation.  Past Medical History  Diagnosis Date  . Essential hypertension, benign   . Mixed hyperlipidemia   . Esophageal reflux   . COPD (chronic obstructive pulmonary disease) ([place])   . History of pulmonary embolism     Multiple, status post IVC filter, on Coumadin  . History of DVT (deep vein thrombosis)   . GERD (gastroesophageal reflux disease)   . Osteoarthritis   . Factor V Leiden mutation ([place])   . Allergic rhinitis   . On home oxygen therapy     2 liters at night  . Transfusion history   . Obstructive sleep apnea     NPSG 05-29-07; AHI 32.9,cpap 7 to 9  . History of cardiac catheterization     Normal coronaries July 2015  . Persistent atrial fibrillation [facility])     Documented October 2016    Current Outpatient Prescriptions  Medication Sig Dispense Refill  . Acetaminophen (TYLENOL ARTHRITIS PAIN PO) Take 2 tablets by mouth 2 (two) times daily as needed (pain).     Marland Kitchen albuterol (ACCUNEB) 1.25 MG/3ML nebulizer solution Take 1 ampule by nebulization every 6 (six) hours as needed for wheezing or shortness of breath.     Marland Kitchen albuterol  (PROAIR HFA) 108 (90 BASE) MCG/ACT inhaler Inhale 2 puffs into the lungs 4 (four) times daily as needed for wheezing or shortness of breath.     . budesonide (PULMICORT FLEXHALER) 180 MCG/ACT inhaler 2 puffs then rinse mouth, twice daily- maintenance (Patient taking differently: Inhale 1 puff into the lungs 2 (two) times daily. ) 1 Inhaler prn  . Calcium Carbonate-Vitamin D (CALCIUM-VITAMIN D) 600-200 MG-UNIT CAPS Take 1 capsule by mouth daily.    . cetirizine (ZYRTEC) 10 MG tablet Take 10 mg by mouth daily.    . Cholecalciferol (VITAMIN D3) 2000 UNITS TABS Take 2,000 Units by mouth daily.     Marland Kitchen diltiazem (CARDIZEM CD) 300 MG 24 hr capsule Take 300 mg by mouth daily.    . famotidine (PEPCID) 20 MG tablet Take 40 mg by mouth 2 (two) times daily.     . flecainide (TAMBOCOR) 50 MG tablet Take 1 tablet (50 mg total) by mouth 2 (two) times daily. 180 tablet 3  . fluticasone (FLONASE) 50 MCG/ACT nasal spray Place 1 spray into the nose 2 (two) times daily.     . Fluticasone-Salmeterol (ADVAIR) 500-50 MCG/DOSE AEPB Inhale 1 puff into the lungs every 12 (twelve) hours.    . furosemide (LASIX) 20 MG tablet Take 40 mg by mouth daily.     Marland Kitchen losartan (COZAAR) 100 MG tablet Take 50 mg by mouth at bedtime.     Marland Kitchen  oxybutynin (DITROPAN) 5 MG tablet Take 5 mg by mouth 2 (two) times daily.    . OXYGEN-HELIUM IN Inhale 2 L into the lungs at bedtime.     . polyethylene glycol powder (GLYCOLAX/MIRALAX) powder Take 17 g by mouth at bedtime.     . pravastatin (PRAVACHOL) 80 MG tablet Take 80 mg by mouth at bedtime.     . traMADol (ULTRAM) 50 MG tablet Take 50 mg by mouth every 6 (six) hours as needed for moderate pain.     . vitamin C (ASCORBIC ACID) 500 MG tablet Take 500 mg by mouth daily.    Marland Kitchen warfarin (COUMADIN) 10 MG tablet Take 10 mg by mouth daily at 6 PM. Take one tablet daily except none of Friday     No current facility-administered medications for this visit.   Allergies:  Adhesive and Benazepril hcl    Social History: The patient  reports that she has never smoked. She has never used smokeless tobacco. She reports that she does not drink alcohol or use illicit drugs.   ROS:  Please see the history of present illness. Otherwise, complete review of systems is positive for back and leg pain, uses a cane.  All other systems are reviewed and negative.   Physical Exam: VS:  BP 100/60 mmHg  Pulse 54  Ht 5\' 7"  (1.702 m)  Wt 399 lb (180.985 kg)  BMI 62.48 kg/m2  SpO2 96%, BMI Body mass index is 62.48 kg/(m^2).  Wt Readings from Last 3 Encounters:  12/30/14 399 lb (180.985 kg)  12/05/14 400 lb (181.439 kg)  11/15/14 403 lb 3.2 oz (182.89 kg)    Morbidly obese woman, appears comfortable at rest. HEENT: Conjunctiva and lids normal, oropharynx clear. Neck: Supple, no elevated JVP or carotid bruits, no thyromegaly. Lungs: Decreased breath sounds without wheezing, nonlabored breathing at rest. Cardiac: Distant, regular rate and rhythm, no S3, soft systolic murmur, no pericardial rub. Abdomen: Morbidly obese with pannus, nontender, bowel sounds present, no guarding or rebound. Extremities: Chronic appearing edema is mild with associated adipose tissue, distal pulses 2+.  ECG: ECG is not ordered today.  Recent Labwork: 11/21/2014: ALT 15; AST 23   Other Studies Reviewed Today:  Echocardiogram 11/02/2014: Study Conclusions  - Left ventricle: The cavity size was normal. Wall thickness was normal. Systolic function was normal. The estimated ejection fraction was in the range of 55% to 60%. Wall motion was normal; there were no regional wall motion abnormalities. - Left atrium: The atrium was mildly to moderately dilated. - Right atrium: The atrium was mildly dilated.  Assessment and Plan:  1. Paroxysmal atrial fibrillation, currently maintaining sinus rhythm. Plan is to stop low-dose Lopressor, continue Cardizem CD at current dose, and continue flecainide. Her follow-up ECG was  stable. Not pursuing treadmill test that she would not be able to manage it. She has a history of normal coronary arteries and normal LVEF.  2. Essential hypertension, blood pressure is normal today.  Current medicines were reviewed with the patient today.  Disposition: FU with me in 4 months.   Signed, Satira Sark, MD, [facility] 12/30/2014 3:33 PM    [place] at [facility] [address] [place], [place] [address] Phone: [phone]; Fax: [phone]

## 2014-12-30 NOTE — Patient Instructions (Signed)
Your physician wants you to follow-up in: 4 months with Dr Ferne Reus will receive a reminder letter in the mail two months in advance. If you don't receive a letter, please call our office to schedule the follow-up appointment.     STOP Lopressor    If you need a refill on your cardiac medications before your next appointment, please call your pharmacy.   Thank you for choosing [facility] !

## 2015-02-01 DIAGNOSIS — I2699 Other pulmonary embolism without acute cor pulmonale: Secondary | ICD-10-CM | POA: Diagnosis not present

## 2015-02-01 DIAGNOSIS — I509 Heart failure, unspecified: Secondary | ICD-10-CM | POA: Diagnosis not present

## 2015-02-01 DIAGNOSIS — I5032 Chronic diastolic (congestive) heart failure: Secondary | ICD-10-CM | POA: Diagnosis not present

## 2015-02-01 DIAGNOSIS — G4733 Obstructive sleep apnea (adult) (pediatric): Secondary | ICD-10-CM | POA: Diagnosis not present

## 2015-02-01 DIAGNOSIS — I10 Essential (primary) hypertension: Secondary | ICD-10-CM | POA: Diagnosis not present

## 2015-02-01 DIAGNOSIS — J45909 Unspecified asthma, uncomplicated: Secondary | ICD-10-CM | POA: Diagnosis not present

## 2015-02-02 DIAGNOSIS — Z7901 Long term (current) use of anticoagulants: Secondary | ICD-10-CM | POA: Diagnosis not present

## 2015-02-13 ENCOUNTER — Encounter: Payer: Self-pay | Admitting: Internal Medicine

## 2015-02-13 ENCOUNTER — Ambulatory Visit (INDEPENDENT_AMBULATORY_CARE_PROVIDER_SITE_OTHER): Payer: PPO | Admitting: Internal Medicine

## 2015-02-13 VITALS — BP 122/80 | HR 72 | Ht 66.0 in | Wt >= 6400 oz

## 2015-02-13 DIAGNOSIS — E662 Morbid (severe) obesity with alveolar hypoventilation: Secondary | ICD-10-CM | POA: Diagnosis not present

## 2015-02-13 DIAGNOSIS — I2782 Chronic pulmonary embolism: Secondary | ICD-10-CM | POA: Diagnosis not present

## 2015-02-13 DIAGNOSIS — G4733 Obstructive sleep apnea (adult) (pediatric): Secondary | ICD-10-CM | POA: Diagnosis not present

## 2015-02-13 DIAGNOSIS — J9611 Chronic respiratory failure with hypoxia: Secondary | ICD-10-CM

## 2015-02-13 NOTE — Progress Notes (Signed)
80 yoF followed for OSA, OHS, morbid obesity, complicated by hx DVT, asthma/ bronchitis, GERD LOV- 03/27/10   daughter and granddaughter here.   PCP Dr. [name] She continues oxygen 3 L per minute continuously/[name]. CPAP 7 CWP is used all night every night without a humidifier by her preference. She feels that she is breathing "through a fog". Hoarseness comes and goes with any strong odor. Still using Advair, nebulizer up to 4 times daily and rescue inhaler less than every day. Recognizes some reflux. Last PFT was 05/05/2007.  05/09/11- 60 yoF followed for OSA, OHS, morbid obesity, complicated by hx DVT, asthma/ bronchitis, GERD Breathing "is about the same" as when seen 03/27/11, pt states voice is improving. Pt had PFT 04/12/11- to be reviewed with pt.   We gave Benicar instead of her ACE inhibitor. With the change, she noticed less cough and hoarseness then with lisinopril. Some heartburn on OTC acid blocker. Using Pulmicort powder. Comfortable with CPAP used all might every night. PFT 04/12/2011-normal spirometry flows within significant response to bronchodilator. FEV1/FVC 0.79. Mild restriction and moderate diffusion reduction both may be do to her obesity.  11/08/11- 59 yoF followed for OSA, OHS, morbid obesity, complicated by hx DVT/ PE, asthma/ bronchitis, GERD SOB and wheezing with exertion; wears O2 all the time She is staying on oxygen at 3 L. CPAP 7/[name] plus oxygen at 3 L, good compliance and control with no concerns. She has not lost weight 341>371). Sample Dulera work during well but she preferred to stay on Advair, which she was used to. Dr [name] manages her Coumadin.  11/09/12-  60 yoF followed for OSA, OHS, morbid obesity, complicated by hx DVT/ PE, asthma/ bronchitis, GERD SOB and wheezing with exertion; wears O2 all the time Follows For: SOB a little worse - Some wheezing - Cough occas prod (tlight yellow -clear) - Wearing CPAP 9-10 hrs/night - Mask fits well CPAP  7/[name] plus oxygen at 3 L continuous.  Weight up 14  More lbs since 2013. Minor cold.  Stays indoors to avoid pollen and odors. Helps to use neb twice daily, Advair 500.   09/07/13- 33 yoF followed for OSA, OHS, morbid obesity, complicated by hx DVT/ PE, asthma/ bronchitis, GERD FOLLOWS FOR: Wears CPAP 7/ [facility] with O2 3L-5 continuous every night for about 8-10 hours; DME is [facility]. Pt having increased SOB, wheezing, cough-non productive. Chest tightness at times. Tried Anoro.     CXR 08/17/13-IMPRESSION:  No active cardiopulmonary disease.  Electronically Signed  By: Inez Catalina M.D.  On: 08/17/2013 16:19 V/Q scan low probability CT chest 08/26/13 IMPRESSION:  1. Very limited exam due to difficulty with IV contrast bolus and  extensive streak artifact. Nevertheless findings suspicious for  bilateral large pulmonary emboli. Repeat chest CT with better  contrast bolus would be needed for confirmation.  2. Cardiomegaly. Coronary artery disease. Critical Value/emergent  results were called by telephone at the time of interpretation on  08/26/2013 at 6:43 pm to the patient's caregiver, who verbally  acknowledged these results.  Electronically Signed:  ByMarcello Moores Register  On: 08/26/2013 18:46  11/09/13- 37 yoF followed for OSA, OHS, morbid obesity, complicated by hx DVT/ PE, asthma/ bronchitis, GERD            Family here FOLLOWS FOR: Wear CPAP 7/ [facility] with O2 3L every night for 8-10 hours.  Patient has wheezing, sob upon exertion. Patient denies chest tightness Thinks she is getting a cold-no fever but mild sore throat and nasal congestion.  Sample of Anoro had no affect. Nebulizer is helpful. She thinks being off of Pulmicort inhaler caused her to be worse for the past 6 months but she is now using Pulmicort by nebulizer.  02/10/14- 63 yoF never smoker followed for OSA, OHS, morbid obesity, complicated by hx DVT/ PE, asthma/ bronchitis, GERD            Family here Dr [name] has  rechecked ONOX to requalify but she may not need it. She says she did not desaturate on a walk test. With insurance change, she is changing from [place] to [facility] for her CPAP 7 and oxygen 3 L. Uses nebulizer and albuterol "sometimes" and says if she needs it it will make a difference, but she does not usually wheeze except with exertion. Easy dyspnea on exertion without acute event. PFT: 02/10/2014-obstructive and restrictive changes and reduction of diffusion best explained by her obesity/hypoventilation syndrome. No response to bronchodilator supporting impression that asthma/bronchitis is well controlled.  02/13/2015-65 year old female never smoker followed for OSA, OHS, morbid obesity, complicated by history DVT/PE, asthma/bronchitis, GERD, PAFib CPAP auto 4-9 and oxygen 3 L sleep/ [facility] FOLLOWS FOR: Wears CPAP every night for about 8+ hours; DME: [facility] Download confirms 100% compliance with excellent control. On flecanide for atrial fib. Says she continues to need both Advair and Pulmicort to control asthma. If she comes off of either for 3 days she begins increased wheeze and chest tightness.  ROS-see HPI Constitutional:   No-   weight loss, night sweats, fevers, chills, fatigue, lassitude. HEENT:   No-  headaches, difficulty swallowing, tooth/dental problems, sore throat,       No-  sneezing, itching, ear ache, +nasal congestion, post nasal drip,  CV:  No-   chest pain, orthopnea, PND, swelling in lower extremities, anasarca,  dizziness, palpitations Resp: + shortness of breath with exertion or at rest.              No-   productive cough,  +Little non-productive cough,  No- coughing up of blood.              No-   change in color of mucus.  No- wheezing.   Skin: No-   rash or lesions. GI:  No-   heartburn, indigestion, abdominal pain, nausea, vomiting,  GU:  MS:  No-   joint pain or swelling.   Neuro-     nothing unusual Psych:  No- change in mood or affect. No depression or  anxiety.  No memory loss.  OBJ- Physical Exam General- Alert, Oriented, Affect-appropriate, Distress- none acute. +Morbid obesity. Room air at rest Skin- rash-none, lesions- none, excoriation- none Lymphadenopathy- none Head- atraumatic            Eyes- Gross vision intact, PERRLA, conjunctivae and secretions clear            Ears- Hearing, canals-normal            Nose- Clear, no-Septal dev, mucus, polyps, erosion, perforation             Throat- Mallampati II-III , mucosa clear , drainage- none, tonsils- atrophic. Mild hoarseness, throat clearing Neck- flexible , trachea midline, no stridor , thyroid nl, carotid no bruit Chest - symmetrical excursion , unlabored           Heart/CV- RRR , no murmur , no gallop  , no rub, nl s1 s2                           -  JVD- none , edema-+1-2+, stasis changes+chronic, varices- none           Lung- clear to P&A, wheeze- none, cough- none , dullness-none, rub- none. Shallow c/w habitus           Chest wall-  Abd- Br/ Gen/ Rectal- Not done, not indicated Extrem- cyanosis- none, clubbing, none, atrophy- none, strength- nl Neuro- grossly intact to observation

## 2015-02-13 NOTE — Patient Instructions (Addendum)
We can continue O2 3L for sleep with CPAP auto 4-9/ [facility]  Please call if we can help

## 2015-02-14 DIAGNOSIS — M25562 Pain in left knee: Secondary | ICD-10-CM | POA: Diagnosis not present

## 2015-02-14 DIAGNOSIS — M25561 Pain in right knee: Secondary | ICD-10-CM | POA: Diagnosis not present

## 2015-02-14 DIAGNOSIS — M25512 Pain in left shoulder: Secondary | ICD-10-CM | POA: Diagnosis not present

## 2015-02-14 DIAGNOSIS — E669 Obesity, unspecified: Secondary | ICD-10-CM | POA: Diagnosis not present

## 2015-02-15 ENCOUNTER — Ambulatory Visit (HOSPITAL_COMMUNITY): Payer: PPO | Attending: Family Medicine

## 2015-02-15 ENCOUNTER — Encounter (HOSPITAL_COMMUNITY): Payer: Self-pay

## 2015-02-15 DIAGNOSIS — R29898 Other symptoms and signs involving the musculoskeletal system: Secondary | ICD-10-CM | POA: Insufficient documentation

## 2015-02-15 DIAGNOSIS — M25512 Pain in left shoulder: Secondary | ICD-10-CM | POA: Diagnosis not present

## 2015-02-15 DIAGNOSIS — M25612 Stiffness of left shoulder, not elsewhere classified: Secondary | ICD-10-CM | POA: Diagnosis not present

## 2015-02-15 DIAGNOSIS — M629 Disorder of muscle, unspecified: Secondary | ICD-10-CM | POA: Diagnosis not present

## 2015-02-15 DIAGNOSIS — M6289 Other specified disorders of muscle: Secondary | ICD-10-CM

## 2015-02-15 NOTE — Patient Instructions (Signed)
1) Seated Row   Sit up straight with elbows by your sides. Pull back with shoulders/elbows, keeping forearms straight, as if pulling back on the reins of a horse. Squeeze shoulder blades together. Repeat _10__times, _2___sets/day    2) Shoulder Elevation    Sit up straight with arms by your sides. Slowly bring your shoulders up towards your ears. Repeat_10__times, __2__ sets/day    3) Shoulder Extension    Sit up straight with both arms by your side, draw your arms back behind your waist. Keep your elbows straight. Repeat _10___times, ___2_sets/day.           TOWEL SLIDES COMPLETE FOR 1-3 MINUTES, 3-5 TIMES PER DAY  SHOULDER: Flexion On Table   Place hands on table, elbows straight. Move hips away from body. Press hands down into table.  Abduction (Passive)   With arm out to side, resting on table, lower head toward arm, keeping trunk away from table.   Copyright  VHI. All rights reserved.     Internal Rotation (Assistive)   Seated with elbow bent at right angle and held against side, slide arm on table surface in an inward arc.  Activity: Use this motion to brush crumbs off the table.

## 2015-02-15 NOTE — Therapy (Addendum)
[facility] [address] [place], [place], [address] Phone: [phone]   Fax:  [phone]  Occupational Therapy Evaluation  Patient Details  Name: Rhonda Acevedo MRN: [id number] Date of Birth: 04-03-1950 Referring Provider: Rhina Brackett  Encounter Date: 02/15/2015      OT End of Session - 02/15/15 1350    Visit Number 1   Number of Visits 16   Date for OT Re-Evaluation 04/16/15  mini reassess: 03/15/15   Authorization Type [employer] plan PPO - $15.00 copay   OT Start Time 1300   OT Stop Time 1338   OT Time Calculation (min) 38 min   Activity Tolerance Patient tolerated treatment well   Behavior During Therapy [facility] for tasks assessed/performed      Past Medical History  Diagnosis Date  . Essential hypertension, benign   . Mixed hyperlipidemia   . Esophageal reflux   . COPD (chronic obstructive pulmonary disease) ([place])   . History of pulmonary embolism     Multiple, status post IVC filter, on Coumadin  . History of DVT (deep vein thrombosis)   . GERD (gastroesophageal reflux disease)   . Osteoarthritis   . Factor V Leiden mutation ([place])   . Allergic rhinitis   . On home oxygen therapy     2 liters at night  . Transfusion history   . Obstructive sleep apnea     NPSG 05-29-07; AHI 32.9,cpap 7 to 9  . History of cardiac catheterization     Normal coronaries July 2015  . Persistent atrial fibrillation [facility])     Documented October 2016    Past Surgical History  Procedure Laterality Date  . Ivc filter    . Ganglion cyst excision    . Hernia abscess    . I&d hernia incisional abscess    . Endometrial ablation    . Rotator cuff repair    . Left heart catheterization with coronary angiogram N/A 08/20/2013    Procedure: LEFT HEART CATHETERIZATION WITH CORONARY ANGIOGRAM;  Surgeon: Blane Ohara, MD;  [facility] C[facility] LAB;  Service: Cardiovascular;  Laterality: N/A;  . Hernia repair      umbilical  .  Cholecystectomy      There were no vitals filed for this visit.  Visit Diagnosis:  Pain in joint of left shoulder - Plan: Ot plan of care cert/re-cert  Shoulder joint stiffness, left - Plan: Ot plan of care cert/re-cert  Shoulder weakness - Plan: Ot plan of care cert/re-cert  Tight fascia - Plan: Ot plan of care cert/re-cert      Subjective Assessment - 02/15/15 1337    Subjective  S: My lift chair died and so I've had to use my arms more to get out of the chair.    Pertinent History Patient is a 65 y/o female S/P left shoulder pain which began approximately 2 months ago. Pt underwent a left RCR in 2007. Pt reports that since her lift chair has stopped working she has relied on her arms more to get up and down which may have triggered her pain. Pt also reports that she is able to move her arm more since the cortisone shot yesterday. Dr. Rip Harbour has referred patient to occupational therapy for evaluation and treatment.    Special Tests FOTO score: 49/100   Patient Stated Goals To decrease pain in arm and increase use like before.    Currently in Pain? Yes   Pain Score 4    Pain  Location Arm   Pain Orientation Left   Pain Descriptors / Indicators Aching;Constant   Pain Type Chronic pain   Pain Onset More than a month ago   Pain Frequency Other (Comment)  9/10 with use. 4/10 at rest.           [facility] OT Assessment - 02/15/15 1256    Assessment   Diagnosis Left shoulder pain   Referring Provider Dominic Mckinley   Onset Date --  onset approx. 6 months ago   Prior Therapy Pt received OP therapy in [place] in 2007 after her RCR.   Precautions   Precautions None   Restrictions   Weight Bearing Restrictions No   Balance Screen   Has the patient fallen in the past 6 months No   Home  Environment   Family/patient expects to be discharged to: Private residence   Living Arrangements Children   Lives With Daughter  and son in law   Prior Function   Level of Independence  Independent with household mobility with device;Independent with community mobility with device   Vocation On disability   ADL   Equipment Used Cane  lift chair, BSC, home oxygen, CPAP machine   ADL comments Difficulty completing any daily task with LUE.    Mobility   Mobility Status Independent   Written Expression   Dominant Hand Right   Vision - History   Baseline Vision Wears glasses all the time   Cognition   Overall Cognitive Status Within Functional Limits for tasks assessed   ROM / Strength   AROM / PROM / Strength AROM;PROM;Strength   AROM   Overall AROM Comments Assessed seated. IR/er adducted.   AROM Assessment Site Shoulder   Right/Left Shoulder Left   Left Shoulder Flexion 97 Degrees   Left Shoulder ABduction 75 Degrees   Left Shoulder Internal Rotation 90 Degrees   Left Shoulder External Rotation 42 Degrees   PROM   Overall PROM Comments Assessed supine. IR/er adducted   PROM Assessment Site Shoulder   Right/Left Shoulder Left   Left Shoulder Flexion 97 Degrees   Left Shoulder ABduction 90 Degrees   Left Shoulder Internal Rotation 90 Degrees   Left Shoulder External Rotation 50 Degrees   Strength   Overall Strength Comments Assessed seated. IR/er adducted.   Strength Assessment Site Shoulder   Right/Left Shoulder Left   Left Shoulder Flexion 3-/5   Left Shoulder ABduction 3-/5   Left Shoulder Internal Rotation 3/5   Left Shoulder External Rotation 3-/5                         OT Education - 02/15/15 1341    Education provided Yes   Education Details table slides and A/ROM scapular exercises   Person(s) Educated Patient   Methods Explanation;Demonstration;Handout;Verbal cues   Comprehension Returned demonstration;Verbalized understanding          OT Short Term Goals - 02/15/15 1409    OT SHORT TERM GOAL #1   Title Patient will be educated and independent with HEP to increase functional use of LUE during daily tasks.   Time 4    Period Weeks   Status New   OT SHORT TERM GOAL #2   Title Patient will increase P/ROM of LUE to [facility] to increase ability to get shirts on and off.    Time 4   Period Weeks   Status New   OT SHORT TERM GOAL #3   Title Patient will increase  LUE strength to 3/5 to increase ability to complete activities at waist level.   Time 4   Period Weeks   Status New   OT SHORT TERM GOAL #4   Title Patient will decrease pain during daily tasks to 5/10.   Time 4   Period Weeks   Status New   OT SHORT TERM GOAL #5   Title Patient will decrease fascial restrictions in left arm to min amount to increase functional mobility.    Time 4   Period Weeks   Status New           OT Long Term Goals - 2015-03-12 1410    OT LONG TERM GOAL #1   Title Patient will return to highest level of independence with all daily and leisure tasks using LUE.   Time 8   Period Weeks   Status New   OT LONG TERM GOAL #2   Title Patient will increase A/ROM to [facility] to increase ability to reach into overhead cabinets with less difficulty.    Time 8   Period Weeks   Status New   OT LONG TERM GOAL #3   Title Patient will increase LUE strength to 4/5 to increase ability to complete tasks as shoulder level.   Time 8   Period Weeks   Status New   OT LONG TERM GOAL #4   Title Patient will decrease left shoulder pain to 2/10 or less during daily tasks.   Time 8   Period Weeks   Status New   OT LONG TERM GOAL #5   Title Patient will decrease left shoulder fascial restrictions to trace amount or less to increase functional mobility needed for reaching tasks.   Time 8   Period Weeks   Status New               Plan - Mar 12, 2015 1406    Clinical Impression Statement A: Patient is a 65 y/o female S/P left shoulder pain causing increase fascial restrictions and pain and decreased strength and ROM resulting in difficulty completing daily tasks.    Pt will benefit from skilled therapeutic intervention in order to improve  on the following deficits (Retired) Decreased strength;Pain;Impaired UE functional use;Decreased range of motion;Increased fascial restricitons   Rehab Potential Good   OT Frequency 2x / week   OT Duration 8 weeks   OT Treatment/Interventions Self-care/ADL training;Ultrasound;Passive range of motion;Patient/family education;Cryotherapy;Electrical Stimulation;Moist Heat;Therapeutic activities;Therapeutic exercises;Manual Therapy   Plan P: Patient requires skilled OT services to increase functional performance during daily tasks when using LUE. Treatment plan: Myofascial release, manual stretching, P/ROM, AA/ROM, A/ROM, general shoulder and scapular strengthening.    Consulted and Agree with Plan of Care Patient        Problem List Patient Active Problem List   Diagnosis Date Noted  . Pulmonary embolism ([place]) 08/27/2013  . Exertional dyspnea 08/17/2013  . Chronic respiratory failure with hypoxia ([place]) 03/27/2010  . HYPERLIPIDEMIA 05/03/2007  . Essential hypertension, benign 05/03/2007  . Asthma, non-allergic 05/03/2007  . GERD 05/03/2007  . Obesity hypoventilation syndrome ([place]) May 23, 2007  . BRONCHITIS May 23, 2007  . Obstructive sleep apnea 05-23-07    03-12-2015 0755  OT G-codes  Functional Assessment Tool Used FOTO 49/100  Functional Limitation Carrying, moving and handling objects  Carrying, Moving and Handling Objects Current Status [phone]) CK  Carrying, Moving and Handling Objects Goal Status [id number]) [place], OTR/L,CBIS  [phone]  2015/03/12, 2:18 PM  [facility]  [place], [place], [address] Phone: [phone]   Fax:  [phone]  Name: Rhonda Acevedo MRN: [id number] Date of Birth: [birth year]

## 2015-02-17 ENCOUNTER — Encounter (HOSPITAL_COMMUNITY): Payer: Self-pay

## 2015-02-17 ENCOUNTER — Ambulatory Visit (HOSPITAL_COMMUNITY): Payer: PPO

## 2015-02-17 DIAGNOSIS — M6289 Other specified disorders of muscle: Secondary | ICD-10-CM

## 2015-02-17 DIAGNOSIS — M25512 Pain in left shoulder: Secondary | ICD-10-CM

## 2015-02-17 DIAGNOSIS — M629 Disorder of muscle, unspecified: Secondary | ICD-10-CM

## 2015-02-17 DIAGNOSIS — M25612 Stiffness of left shoulder, not elsewhere classified: Secondary | ICD-10-CM

## 2015-02-17 DIAGNOSIS — R29898 Other symptoms and signs involving the musculoskeletal system: Secondary | ICD-10-CM

## 2015-02-17 NOTE — Therapy (Signed)
[place], [place], [address] Phone: [phone]   Fax:  [phone]  Occupational Therapy Treatment  Patient Details  Name: Rhonda Acevedo MRN: [id number] Date of Birth: 14-Jun-1950 Referring Provider: Rhina Brackett  Encounter Date: 02/17/2015      OT End of Session - 02/17/15 1450    Visit Number 2   Number of Visits 16   Date for OT Re-Evaluation 04/16/15  mini reassess: 03/15/15   Authorization Type [employer] plan PPO - $15.00 copay   OT Start Time 1350   OT Stop Time 1430   OT Time Calculation (min) 40 min   Activity Tolerance Patient tolerated treatment well   Behavior During Therapy [facility] for tasks assessed/performed      Past Medical History  Diagnosis Date  . Essential hypertension, benign   . Mixed hyperlipidemia   . Esophageal reflux   . COPD (chronic obstructive pulmonary disease) ([place])   . History of pulmonary embolism     Multiple, status post IVC filter, on Coumadin  . History of DVT (deep vein thrombosis)   . GERD (gastroesophageal reflux disease)   . Osteoarthritis   . Factor V Leiden mutation ([place])   . Allergic rhinitis   . On home oxygen therapy     2 liters at night  . Transfusion history   . Obstructive sleep apnea     NPSG 05-29-07; AHI 32.9,cpap 7 to 9  . History of cardiac catheterization     Normal coronaries July 2015  . Persistent atrial fibrillation [facility])     Documented October 2016    Past Surgical History  Procedure Laterality Date  . Ivc filter    . Ganglion cyst excision    . Hernia abscess    . I&d hernia incisional abscess    . Endometrial ablation    . Rotator cuff repair    . Left heart catheterization with coronary angiogram N/A 08/20/2013    Procedure: LEFT HEART CATHETERIZATION WITH CORONARY ANGIOGRAM;  Surgeon: Blane Ohara, MD;  Location: [facility] CATH LAB;  Service: Cardiovascular;  Laterality: N/A;  . Hernia repair      umbilical  .  Cholecystectomy      There were no vitals filed for this visit.  Visit Diagnosis:  Pain in joint of left shoulder  Shoulder joint stiffness, left  Shoulder weakness  Tight fascia      Subjective Assessment - 02/17/15 1421    Subjective  S: I didn't take my pain pill today. I drove myself and it makes me really funny feeling.    Currently in Pain? Yes   Pain Score 6    Pain Location Shoulder   Pain Orientation Left   Pain Descriptors / Indicators Aching;Constant   Pain Type Chronic pain            [facility] OT Assessment - 02/17/15 1422    Assessment   Diagnosis Left shoulder pain   Precautions   Precautions None                  OT Treatments/Exercises (OP) - 02/17/15 1422    Exercises   Exercises Shoulder   Shoulder Exercises: Supine   Protraction PROM;AAROM;5 reps   Horizontal ABduction PROM;AAROM;5 reps   External Rotation PROM;5 reps   Internal Rotation AAROM;5 reps   Flexion PROM;AAROM;5 reps   ABduction PROM;5 reps   Shoulder Exercises: Seated   Elevation AROM;10 reps   Extension AROM;10 reps  Row AROM;10 reps   Shoulder Exercises: Therapy Ball   Flexion 10 reps   ABduction 10 reps   Manual Therapy   Manual Therapy Myofascial release   Manual therapy comments Manual therapy completed prior to exercises.   Myofascial Release Myofascial release and manual stretching to left upper arm, trapezius, and scapularis region to decrease the fascial restrictions and increase joint mobility in a pain free zone.                 OT Education - 02/17/15 1449    Education provided Yes   Education Details Pt was given OT evaluation and reviewed goals.    Person(s) Educated Patient   Methods Explanation;Handout   Comprehension Verbalized understanding          OT Short Term Goals - 02/17/15 1420    OT SHORT TERM GOAL #1   Title Patient will be educated and independent with HEP to increase functional use of LUE during daily tasks.   Time 4    Period Weeks   Status On-going   OT SHORT TERM GOAL #2   Title Patient will increase P/ROM of LUE to [facility] to increase ability to get shirts on and off.    Time 4   Period Weeks   Status On-going   OT SHORT TERM GOAL #3   Title Patient will increase LUE strength to 3/5 to increase ability to complete activities at waist level.   Time 4   Period Weeks   Status On-going   OT SHORT TERM GOAL #4   Title Patient will decrease pain during daily tasks to 5/10.   Time 4   Period Weeks   Status On-going   OT SHORT TERM GOAL #5   Title Patient will decrease fascial restrictions in left arm to min amount to increase functional mobility.    Time 4   Period Weeks   Status On-going           OT Long Term Goals - 02/17/15 1421    OT LONG TERM GOAL #1   Title Patient will return to highest level of independence with all daily and leisure tasks using LUE.   Time 8   Period Weeks   Status On-going   OT LONG TERM GOAL #2   Title Patient will increase A/ROM to [facility] to increase ability to reach into overhead cabinets with less difficulty.    Time 8   Period Weeks   Status On-going   OT LONG TERM GOAL #3   Title Patient will increase LUE strength to 4/5 to increase ability to complete tasks as shoulder level.   Time 8   Period Weeks   Status On-going   OT LONG TERM GOAL #4   Title Patient will decrease left shoulder pain to 2/10 or less during daily tasks.   Time 8   Period Weeks   Status On-going   OT LONG TERM GOAL #5   Title Patient will decrease left shoulder fascial restrictions to trace amount or less to increase functional mobility needed for reaching tasks.   Time 8   Period Weeks   Status On-going               Plan - 02/17/15 1452    Clinical Impression Statement A: Initiated myofascial release, manual stretching, some AA/ROM supine and therapy ball exercises. Patient experienced increased pain during manual stretching although was able to achieve more P/ROM than  at evaluation. Did attempt some AA/ROM supine.  patient did have pain with flexion, protraction and horizontal abduction.    Plan P: Follow up on pain medication results (Pt states she will try taking half a pill versus a whole pill due to how fuzzy it makes her feel). Continue with pass ROM and progress to AA/ROM as pain decreases and patient is able to tolerate.         Problem List Patient Active Problem List   Diagnosis Date Noted  . Pulmonary embolism ([place]) 08/27/2013  . Exertional dyspnea 08/17/2013  . Chronic respiratory failure with hypoxia ([place]) 03/27/2010  . HYPERLIPIDEMIA 05/03/2007  . Essential hypertension, benign 05/03/2007  . Asthma, non-allergic 05/03/2007  . GERD 05/03/2007  . Obesity hypoventilation syndrome ([place]) 04/28/2007  . BRONCHITIS 04/28/2007  . Obstructive sleep apnea 04/28/2007    Ailene Ravel, OTR/L,CBIS  [phone]  02/17/2015, 3:00 PM  [facility] [address] [place], [place], [address] Phone: [phone]   Fax:  [phone]  Name: Rhonda Acevedo MRN: [id number] Date of Birth: 1950-08-11

## 2015-02-19 NOTE — Assessment & Plan Note (Signed)
Excellent compliance and control at current settings. She is comfortable without concerns.

## 2015-02-19 NOTE — Assessment & Plan Note (Signed)
Without recurrence. Long-term anticoagulation.

## 2015-02-19 NOTE — Assessment & Plan Note (Signed)
Massive obesity without lifestyle change to allow weight loss

## 2015-02-19 NOTE — Assessment & Plan Note (Signed)
Sitting in exam room at rest on room air but otherwise dependent on oxygen

## 2015-02-20 ENCOUNTER — Ambulatory Visit (HOSPITAL_COMMUNITY): Payer: PPO | Admitting: Specialist

## 2015-02-20 DIAGNOSIS — M25612 Stiffness of left shoulder, not elsewhere classified: Secondary | ICD-10-CM

## 2015-02-20 DIAGNOSIS — M25512 Pain in left shoulder: Secondary | ICD-10-CM

## 2015-02-20 DIAGNOSIS — R29898 Other symptoms and signs involving the musculoskeletal system: Secondary | ICD-10-CM

## 2015-02-20 NOTE — Therapy (Signed)
[place], [place], [address] Phone: [phone]   Fax:  [phone]  Occupational Therapy Treatment  Patient Details  Name: Rhonda Acevedo MRN: [id number] Date of Birth: 08-07-1950 Referring Provider: Rhina Brackett  Encounter Date: 02/20/2015      OT End of Session - 02/20/15 1640    Visit Number 3   Number of Visits 16   Date for OT Re-Evaluation 04/16/15  mini reassess on 03/15/15   Authorization Type [employer] plan PPO - $15.00 copay   Authorization Time Period [place]ore 10th visit   Authorization - Visit Number 3   Authorization - Number of Visits 10   OT Start Time [id number]  patient fatigued with treatment   OT Stop Time 1420   OT Time Calculation (min) 32 min   Activity Tolerance Patient tolerated treatment well   Behavior During Therapy [facility] for tasks assessed/performed      Past Medical History  Diagnosis Date  . Essential hypertension, benign   . Mixed hyperlipidemia   . Esophageal reflux   . COPD (chronic obstructive pulmonary disease) ([place])   . History of pulmonary embolism     Multiple, status post IVC filter, on Coumadin  . History of DVT (deep vein thrombosis)   . GERD (gastroesophageal reflux disease)   . Osteoarthritis   . Factor V Leiden mutation ([place])   . Allergic rhinitis   . On home oxygen therapy     2 liters at night  . Transfusion history   . Obstructive sleep apnea     NPSG 05-29-07; AHI 32.9,cpap 7 to 9  . History of cardiac catheterization     Normal coronaries July 2015  . Persistent atrial fibrillation [facility])     Documented October 2016    Past Surgical History  Procedure Laterality Date  . Ivc filter    . Ganglion cyst excision    . Hernia abscess    . I&d hernia incisional abscess    . Endometrial ablation    . Rotator cuff repair    . Left heart catheterization with coronary angiogram N/A 08/20/2013    Procedure: LEFT HEART CATHETERIZATION WITH CORONARY  ANGIOGRAM;  Surgeon: Blane Ohara, MD;  Location: [facility] CATH LAB;  Service: Cardiovascular;  Laterality: N/A;  . Hernia repair      umbilical  . Cholecystectomy      There were no vitals filed for this visit.  Visit Diagnosis:  Pain in joint of left shoulder  Shoulder joint stiffness, left  Shoulder weakness      Subjective Assessment - 02/20/15 1343    Subjective  S:  It still feels sore.  I have been doing the exercises. I am taking a 1/2 of a pain pill and that seems to help the pain and not make me feel woozy.   Currently in Pain? Yes   Pain Score 3    Pain Location Shoulder   Pain Orientation Left   Pain Descriptors / Indicators Aching;Constant   Pain Type Acute pain   Pain Onset More than a month ago   Pain Frequency Intermittent   Aggravating Factors  movement   Pain Relieving Factors resting and medication and ice   Effect of Pain on Daily Activities unable to use left arm fully with daily activities             [facility] OT Assessment - 02/20/15 0001    Assessment   Diagnosis Left shoulder pain  Precautions   Precautions None                  OT Treatments/Exercises (OP) - 02/20/15 0001    Exercises   Exercises Shoulder   Shoulder Exercises: Supine   Protraction PROM;AAROM;10 reps   Horizontal ABduction PROM;AAROM;10 reps   External Rotation PROM;AAROM;10 reps   Internal Rotation PROM;AAROM;10 reps   Flexion PROM;AAROM;10 reps   ABduction PROM;AAROM;10 reps   Shoulder Exercises: Seated   Elevation AROM;12 reps   Extension AROM;10 reps   Row AROM;10 reps   Shoulder Exercises: Pulleys   Flexion 1 minute   ABduction 1 minute   Shoulder Exercises: Therapy Ball   Flexion 15 reps   ABduction 15 reps   Manual Therapy   Manual Therapy Myofascial release   Manual therapy comments Manual therapy completed prior to exercises.   Myofascial Release Myofascial release and manual stretching to left upper arm, trapezius, and scapularis region to  decrease the fascial restrictions and increase joint mobility in a pain free zone.                   OT Short Term Goals - 02/17/15 1420    OT SHORT TERM GOAL #1   Title Patient will be educated and independent with HEP to increase functional use of LUE during daily tasks.   Time 4   Period Weeks   Status On-going   OT SHORT TERM GOAL #2   Title Patient will increase P/ROM of LUE to [facility] to increase ability to get shirts on and off.    Time 4   Period Weeks   Status On-going   OT SHORT TERM GOAL #3   Title Patient will increase LUE strength to 3/5 to increase ability to complete activities at waist level.   Time 4   Period Weeks   Status On-going   OT SHORT TERM GOAL #4   Title Patient will decrease pain during daily tasks to 5/10.   Time 4   Period Weeks   Status On-going   OT SHORT TERM GOAL #5   Title Patient will decrease fascial restrictions in left arm to min amount to increase functional mobility.    Time 4   Period Weeks   Status On-going           OT Long Term Goals - 02/17/15 1421    OT LONG TERM GOAL #1   Title Patient will return to highest level of independence with all daily and leisure tasks using LUE.   Time 8   Period Weeks   Status On-going   OT LONG TERM GOAL #2   Title Patient will increase A/ROM to [facility] to increase ability to reach into overhead cabinets with less difficulty.    Time 8   Period Weeks   Status On-going   OT LONG TERM GOAL #3   Title Patient will increase LUE strength to 4/5 to increase ability to complete tasks as shoulder level.   Time 8   Period Weeks   Status On-going   OT LONG TERM GOAL #4   Title Patient will decrease left shoulder pain to 2/10 or less during daily tasks.   Time 8   Period Weeks   Status On-going   OT LONG TERM GOAL #5   Title Patient will decrease left shoulder fascial restrictions to trace amount or less to increase functional mobility needed for reaching tasks.   Time 8   Period Weeks  Status On-going               Plan - 02/20/15 1643    Clinical Impression Statement A:  Patient demonstrated improved P/ROM this date to [place] in left shoulder, although end range continues to be painful.  added pulleys this date for improved AA/ROM, patient able to complete 10 repetitions of AA/ROM this date.    Plan P:  Add AA/ROM in seated, proximal shoulder strengthening in supine, rhythmic stabilization, pvc AA/ROM in standing, and wall wash for improved functional use of her left arm with daily tasks.        Problem List Patient Active Problem List   Diagnosis Date Noted  . Pulmonary embolism ([place]) 08/27/2013  . Exertional dyspnea 08/17/2013  . Chronic respiratory failure with hypoxia ([place]) 03/27/2010  . HYPERLIPIDEMIA 05/03/2007  . Essential hypertension, benign 05/03/2007  . Asthma, non-allergic 05/03/2007  . GERD 05/03/2007  . Obesity hypoventilation syndrome ([place]) 04/28/2007  . BRONCHITIS 04/28/2007  . Obstructive sleep apnea 04/28/2007    Vangie Bicker, OTR/L [phone]  02/20/2015, 4:48 PM  [facility] [address] [place], [place], [address] Phone: [phone]   Fax:  [phone]  Name: Rhonda Acevedo MRN: [id number] Date of Birth: 1950/12/30

## 2015-02-23 ENCOUNTER — Ambulatory Visit (HOSPITAL_COMMUNITY): Payer: PPO | Attending: Family Medicine

## 2015-02-23 ENCOUNTER — Encounter (HOSPITAL_COMMUNITY): Payer: Self-pay

## 2015-02-23 DIAGNOSIS — M25612 Stiffness of left shoulder, not elsewhere classified: Secondary | ICD-10-CM | POA: Insufficient documentation

## 2015-02-23 DIAGNOSIS — M25512 Pain in left shoulder: Secondary | ICD-10-CM | POA: Diagnosis not present

## 2015-02-23 DIAGNOSIS — M6289 Other specified disorders of muscle: Secondary | ICD-10-CM

## 2015-02-23 DIAGNOSIS — M629 Disorder of muscle, unspecified: Secondary | ICD-10-CM | POA: Diagnosis not present

## 2015-02-23 DIAGNOSIS — R29898 Other symptoms and signs involving the musculoskeletal system: Secondary | ICD-10-CM | POA: Diagnosis not present

## 2015-02-23 NOTE — Therapy (Signed)
[place], [place], [address] Phone: [phone]   Fax:  [phone]  Occupational Therapy Treatment  Patient Details  Name: Rhonda Acevedo MRN: [id number] Date of Birth: 20-Apr-1950 Referring Provider: Rhina Brackett  Encounter Date: 02/23/2015      OT End of Session - 02/23/15 1545    Visit Number 4   Number of Visits 16   Date for OT Re-Evaluation 04/16/15  mini reassess on 03/15/15   Authorization Type [employer] plan PPO - $15.00 copay   Authorization Time Period [facility] 10th visit   Authorization - Visit Number 4   Authorization - Number of Visits 10   OT Start Time 1430   OT Stop Time 1515   OT Time Calculation (min) 45 min   Activity Tolerance Patient tolerated treatment well   Behavior During Therapy [facility] for tasks assessed/performed      Past Medical History  Diagnosis Date  . Essential hypertension, benign   . Mixed hyperlipidemia   . Esophageal reflux   . COPD (chronic obstructive pulmonary disease) ([place])   . History of pulmonary embolism     Multiple, status post IVC filter, on Coumadin  . History of DVT (deep vein thrombosis)   . GERD (gastroesophageal reflux disease)   . Osteoarthritis   . Factor V Leiden mutation ([place])   . Allergic rhinitis   . On home oxygen therapy     2 liters at night  . Transfusion history   . Obstructive sleep apnea     NPSG 05-29-07; AHI 32.9,cpap 7 to 9  . History of cardiac catheterization     Normal coronaries July 2015  . Persistent atrial fibrillation [facility])     Documented October 2016    Past Surgical History  Procedure Laterality Date  . Ivc filter    . Ganglion cyst excision    . Hernia abscess    . I&d hernia incisional abscess    . Endometrial ablation    . Rotator cuff repair    . Left heart catheterization with coronary angiogram N/A 08/20/2013    Procedure: LEFT HEART CATHETERIZATION WITH CORONARY ANGIOGRAM;  Surgeon: Blane Ohara, MD;  Location: [facility] CATH LAB;  Service: Cardiovascular;  Laterality: N/A;  . Hernia repair      umbilical  . Cholecystectomy      There were no vitals filed for this visit.  Visit Diagnosis:  Pain in joint of left shoulder  Shoulder weakness  Shoulder joint stiffness, left  Tight fascia      Subjective Assessment - 02/23/15 1449    Subjective  S: My daughter worked on my knots earlier today.   Currently in Pain? Yes   Pain Score 4    Pain Location Shoulder   Pain Orientation Left   Pain Descriptors / Indicators Aching;Sore   Pain Type Acute pain            [facility] OT Assessment - 02/23/15 1450    Assessment   Diagnosis Left shoulder pain   Precautions   Precautions None                  OT Treatments/Exercises (OP) - 02/23/15 1450    Exercises   Exercises Shoulder   Shoulder Exercises: Supine   Protraction PROM;5 reps;AAROM;15 reps   Horizontal ABduction PROM;5 reps;AAROM;15 reps   External Rotation PROM;5 reps;AAROM;15 reps   Internal Rotation PROM;5 reps;AAROM;15 reps   Flexion PROM;5 reps;AAROM;15 reps  ABduction PROM;5 reps;AAROM;15 reps   Shoulder Exercises: Seated   Protraction AAROM;10 reps   Horizontal ABduction AAROM;10 reps   External Rotation AAROM;10 reps   Internal Rotation AAROM;10 reps   Flexion AAROM;10 reps   Abduction AAROM;10 reps   Shoulder Exercises: Standing   Other Standing Exercises PVC pipe stretch completing shoulder flexion then transitioning into abduction. 10X   Shoulder Exercises: ROM/Strengthening   Wall Wash 1'   Manual Therapy   Manual Therapy Myofascial release   Manual therapy comments Manual therapy completed prior to exercises.   Myofascial Release Myofascial release and manual stretching to left upper arm, trapezius, and scapularis region to decrease the fascial restrictions and increase joint mobility in a pain free zone.                   OT Short Term Goals - 02/17/15 1420    OT  SHORT TERM GOAL #1   Title Patient will be educated and independent with HEP to increase functional use of LUE during daily tasks.   Time 4   Period Weeks   Status On-going   OT SHORT TERM GOAL #2   Title Patient will increase P/ROM of LUE to [facility] to increase ability to get shirts on and off.    Time 4   Period Weeks   Status On-going   OT SHORT TERM GOAL #3   Title Patient will increase LUE strength to 3/5 to increase ability to complete activities at waist level.   Time 4   Period Weeks   Status On-going   OT SHORT TERM GOAL #4   Title Patient will decrease pain during daily tasks to 5/10.   Time 4   Period Weeks   Status On-going   OT SHORT TERM GOAL #5   Title Patient will decrease fascial restrictions in left arm to min amount to increase functional mobility.    Time 4   Period Weeks   Status On-going           OT Long Term Goals - 02/17/15 1421    OT LONG TERM GOAL #1   Title Patient will return to highest level of independence with all daily and leisure tasks using LUE.   Time 8   Period Weeks   Status On-going   OT LONG TERM GOAL #2   Title Patient will increase A/ROM to [facility] to increase ability to reach into overhead cabinets with less difficulty.    Time 8   Period Weeks   Status On-going   OT LONG TERM GOAL #3   Title Patient will increase LUE strength to 4/5 to increase ability to complete tasks as shoulder level.   Time 8   Period Weeks   Status On-going   OT LONG TERM GOAL #4   Title Patient will decrease left shoulder pain to 2/10 or less during daily tasks.   Time 8   Period Weeks   Status On-going   OT LONG TERM GOAL #5   Title Patient will decrease left shoulder fascial restrictions to trace amount or less to increase functional mobility needed for reaching tasks.   Time 8   Period Weeks   Status On-going               Plan - 02/23/15 1545    Clinical Impression Statement A: Completed AA/ROM seated. Added wall wash and pvc piping  exercise. VC needed to complete with proper form and technique. Pt reports fatigue and discomfort throughout  session. P/ROM has increased this session and patient is able to tolerate stretch farther than before.    Plan P: Update HEP wth AA/ROM exercises. Add proximal shoulder strengthening in supine and rhythmic stabilization supine.         Problem List Patient Active Problem List   Diagnosis Date Noted  . Pulmonary embolism ([place]) 08/27/2013  . Exertional dyspnea 08/17/2013  . Chronic respiratory failure with hypoxia ([place]) 03/27/2010  . HYPERLIPIDEMIA 05/03/2007  . Essential hypertension, benign 05/03/2007  . Asthma, non-allergic 05/03/2007  . GERD 05/03/2007  . Obesity hypoventilation syndrome ([place]) 04/28/2007  . BRONCHITIS 04/28/2007  . Obstructive sleep apnea 04/28/2007    Ailene Ravel, OTR/L,CBIS  [phone]  02/23/2015, 3:48 PM  [facility] [address] [place], [place], [address] Phone: [phone]   Fax:  [phone]  Name: Rhonda Acevedo MRN: [id number] Date of Birth: 02-09-1950

## 2015-02-24 ENCOUNTER — Encounter: Payer: Self-pay | Admitting: Internal Medicine

## 2015-02-27 ENCOUNTER — Ambulatory Visit (HOSPITAL_COMMUNITY): Payer: PPO

## 2015-02-27 ENCOUNTER — Encounter (HOSPITAL_COMMUNITY): Payer: Self-pay

## 2015-02-27 DIAGNOSIS — M25512 Pain in left shoulder: Secondary | ICD-10-CM | POA: Diagnosis not present

## 2015-02-27 DIAGNOSIS — R29898 Other symptoms and signs involving the musculoskeletal system: Secondary | ICD-10-CM

## 2015-02-27 DIAGNOSIS — M25612 Stiffness of left shoulder, not elsewhere classified: Secondary | ICD-10-CM

## 2015-02-27 DIAGNOSIS — M6289 Other specified disorders of muscle: Secondary | ICD-10-CM

## 2015-02-27 DIAGNOSIS — M629 Disorder of muscle, unspecified: Secondary | ICD-10-CM

## 2015-02-27 NOTE — Patient Instructions (Signed)
Perform each exercise ___10-12_____ reps. 2-3x days.   Protraction - STANDING  Start by holding a wand or cane at chest height.  Next, slowly push the wand outwards in front of your body so that your elbows become fully straightened. Then, return to the original position.     Shoulder FLEXION - STANDING - PALMS UP  In the standing position, hold a wand/cane with both arms, palms up on both sides. Raise up the wand/cane allowing your unaffected arm to perform most of the effort. Your affected arm should be partially relaxed.      Internal/External ROTATION - STANDING  In the standing position, hold a wand/cane with both hands keeping your elbows bent. Move your arms and wand/cane to one side.  Your affected arm should be partially relaxed while your unaffected arm performs most of the effort.       Shoulder ABDUCTION - STANDING  While holding a wand/cane palm face up on the injured side and palm face down on the uninjured side, slowly raise up your injured arm to the side.                     Horizontal Abduction/Adduction      Straight arms holding cane at shoulder height, bring cane to right, center, left. Repeat starting to left.   Copyright  VHI. All rights reserved.       

## 2015-02-27 NOTE — Therapy (Addendum)
[place], [place], [address] Phone: [phone]   Fax:  [phone]  Occupational Therapy Treatment  Patient Details  Name: Rhonda Acevedo MRN: [id number] Date of Birth: 12/02/1950 Referring Provider: Rhina Brackett  Encounter Date: 02/27/2015      OT End of Session - 02/27/15 1535    Visit Number 5   Number of Visits 16   Date for OT Re-Evaluation 04/16/15  mini reassess on 03/15/15   Authorization Type [employer] PPO - $15.00 copay   Authorization Time Period begore 10th visit   Authorization - Visit Number 5   Authorization - Number of Visits 10   OT Start Time [id number]   OT Stop Time 1430   OT Time Calculation (min) 45 min   Activity Tolerance Patient tolerated treatment well   Behavior During Therapy [facility] for tasks assessed/performed      Past Medical History  Diagnosis Date  . Essential hypertension, benign   . Mixed hyperlipidemia   . Esophageal reflux   . COPD (chronic obstructive pulmonary disease) ([place])   . History of pulmonary embolism     Multiple, status post IVC filter, on Coumadin  . History of DVT (deep vein thrombosis)   . GERD (gastroesophageal reflux disease)   . Osteoarthritis   . Factor V Leiden mutation ([place])   . Allergic rhinitis   . On home oxygen therapy     2 liters at night  . Transfusion history   . Obstructive sleep apnea     NPSG 05-29-07; AHI 32.9,cpap 7 to 9  . History of cardiac catheterization     Normal coronaries July 2015  . Persistent atrial fibrillation [facility])     Documented October 2016    Past Surgical History  Procedure Laterality Date  . Ivc filter    . Ganglion cyst excision    . Hernia abscess    . I&d hernia incisional abscess    . Endometrial ablation    . Rotator cuff repair    . Left heart catheterization with coronary angiogram N/A 08/20/2013    Procedure: LEFT HEART CATHETERIZATION WITH CORONARY ANGIOGRAM;  Surgeon: Blane Ohara, MD;  Location: [facility] CATH LAB;  Service: Cardiovascular;  Laterality: N/A;  . Hernia repair      umbilical  . Cholecystectomy      There were no vitals filed for this visit.  Visit Diagnosis:  Pain in joint of left shoulder  Shoulder weakness  Shoulder joint stiffness, left  Tight fascia      Subjective Assessment - 02/27/15 1406    Subjective  S: My daughter wasn't able to work on my shoulder this weekend.    Currently in Pain? Yes   Pain Score 2    Pain Location Shoulder   Pain Orientation Left   Pain Descriptors / Indicators Aching;Sore   Pain Type Acute pain   Pain Onset More than a month ago   Pain Frequency Intermittent   Aggravating Factors  movement            [facility] OT Assessment - 02/27/15 1407    Assessment   Diagnosis Left shoulder pain   Precautions   Precautions None                  OT Treatments/Exercises (OP) - 02/27/15 1407    Exercises   Exercises Shoulder   Shoulder Exercises: Supine   Protraction PROM;5 reps;AAROM;15 reps   Horizontal ABduction  PROM;5 reps;AAROM;15 reps   External Rotation PROM;5 reps;AAROM;15 reps   Internal Rotation PROM;5 reps;AAROM;15 reps   Flexion PROM;5 reps;AAROM;15 reps   ABduction PROM;5 reps;AAROM;15 reps   Shoulder Exercises: Seated   Protraction AAROM;10 reps   Horizontal ABduction AAROM;10 reps   External Rotation AAROM;10 reps   Internal Rotation AAROM;10 reps   Flexion AAROM;10 reps   Abduction AAROM;10 reps   Shoulder Exercises: Standing   Other Standing Exercises PVC pipe stretch completing shoulder flexion then transitioning into abduction. 10X   Shoulder Exercises: ROM/Strengthening   Proximal Shoulder Strengthening, Supine 10X with 4 rest breaks and increased pain   Manual Therapy   Manual Therapy Myofascial release   Manual therapy comments Manual therapy completed prior to exercises.   Myofascial Release Myofascial release and manual stretching to left upper arm, trapezius,  and scapularis region to decrease the fascial restrictions and increase joint mobility in a pain free zone.                 OT Education - 02/27/15 1406    Education provided Yes   Education Details AA/ROM exercises   Person(s) Educated Patient   Methods Explanation;Demonstration;Verbal cues;Handout   Comprehension Returned demonstration;Verbalized understanding          OT Short Term Goals - 02/17/15 1420    OT SHORT TERM GOAL #1   Title Patient will be educated and independent with HEP to increase functional use of LUE during daily tasks.   Time 4   Period Weeks   Status On-going   OT SHORT TERM GOAL #2   Title Patient will increase P/ROM of LUE to [facility] to increase ability to get shirts on and off.    Time 4   Period Weeks   Status On-going   OT SHORT TERM GOAL #3   Title Patient will increase LUE strength to 3/5 to increase ability to complete activities at waist level.   Time 4   Period Weeks   Status On-going   OT SHORT TERM GOAL #4   Title Patient will decrease pain during daily tasks to 5/10.   Time 4   Period Weeks   Status On-going   OT SHORT TERM GOAL #5   Title Patient will decrease fascial restrictions in left arm to min amount to increase functional mobility.    Time 4   Period Weeks   Status On-going           OT Long Term Goals - 02/17/15 1421    OT LONG TERM GOAL #1   Title Patient will return to highest level of independence with all daily and leisure tasks using LUE.   Time 8   Period Weeks   Status On-going   OT LONG TERM GOAL #2   Title Patient will increase A/ROM to [facility] to increase ability to reach into overhead cabinets with less difficulty.    Time 8   Period Weeks   Status On-going   OT LONG TERM GOAL #3   Title Patient will increase LUE strength to 4/5 to increase ability to complete tasks as shoulder level.   Time 8   Period Weeks   Status On-going   OT LONG TERM GOAL #4   Title Patient will decrease left shoulder pain  to 2/10 or less during daily tasks.   Time 8   Period Weeks   Status On-going   OT LONG TERM GOAL #5   Title Patient will decrease left shoulder fascial restrictions to  trace amount or less to increase functional mobility needed for reaching tasks.   Time 8   Period Weeks   Status On-going               Plan - 02/27/15 1535    Clinical Impression Statement A: Updated HEP with AA/ROM. Added proximal shoulder strengthening supine although patient completed with increased pain. Did not add rhythmic stabilization due to pain.    Plan P: Continue with proximal shoulder strength and pvc piping. Increase AA/ROM repetitions as able to seated.         Problem List Patient Active Problem List   Diagnosis Date Noted  . Pulmonary embolism ([place]) 08/27/2013  . Exertional dyspnea 08/17/2013  . Chronic respiratory failure with hypoxia ([place]) 03/27/2010  . HYPERLIPIDEMIA 05/03/2007  . Essential hypertension, benign 05/03/2007  . Asthma, non-allergic 05/03/2007  . GERD 05/03/2007  . Obesity hypoventilation syndrome ([place]) 04/28/2007  . BRONCHITIS 04/28/2007  . Obstructive sleep apnea 04/28/2007    Ailene Ravel, OTR/L,CBIS  [phone]  02/27/2015, 3:37 PM  [place] [address] [place], [place], [address] Phone: [phone]   Fax:  [phone]  Name: Rhonda Acevedo MRN: [id number] Date of Birth: 02/29/1950

## 2015-02-28 ENCOUNTER — Telehealth (HOSPITAL_COMMUNITY): Payer: Self-pay | Admitting: Physical Therapy

## 2015-02-28 DIAGNOSIS — M1712 Unilateral primary osteoarthritis, left knee: Secondary | ICD-10-CM | POA: Diagnosis not present

## 2015-02-28 DIAGNOSIS — M1711 Unilateral primary osteoarthritis, right knee: Secondary | ICD-10-CM | POA: Diagnosis not present

## 2015-02-28 NOTE — Telephone Encounter (Signed)
Patient called she said the MD states she doesn't need PT on her knee. 02/28/15 NF

## 2015-03-02 ENCOUNTER — Encounter (HOSPITAL_COMMUNITY): Payer: Self-pay

## 2015-03-02 ENCOUNTER — Ambulatory Visit (HOSPITAL_COMMUNITY): Payer: PPO | Admitting: Physical Therapy

## 2015-03-02 ENCOUNTER — Ambulatory Visit (HOSPITAL_COMMUNITY): Payer: PPO

## 2015-03-02 DIAGNOSIS — M25612 Stiffness of left shoulder, not elsewhere classified: Secondary | ICD-10-CM

## 2015-03-02 DIAGNOSIS — M25512 Pain in left shoulder: Secondary | ICD-10-CM | POA: Diagnosis not present

## 2015-03-02 DIAGNOSIS — R29898 Other symptoms and signs involving the musculoskeletal system: Secondary | ICD-10-CM

## 2015-03-02 DIAGNOSIS — M6289 Other specified disorders of muscle: Secondary | ICD-10-CM

## 2015-03-02 DIAGNOSIS — M629 Disorder of muscle, unspecified: Secondary | ICD-10-CM

## 2015-03-02 NOTE — Therapy (Signed)
[place], [place], [address] Phone: [phone]   Fax:  [phone]  Occupational Therapy Treatment  Patient Details  Name: Rhonda Acevedo MRN: [id number] Date of Birth: 15-Apr-1950 Referring Provider: Rhina Brackett  Encounter Date: 03/02/2015      OT End of Session - 03/02/15 1145    Visit Number 6   Number of Visits 16   Date for OT Re-Evaluation 04/16/15  mini reassess on 03/15/15   Authorization Type [employer] plan PPO - $15.00 copay   Authorization Time Period begore 10th visit   Authorization - Visit Number 6   Authorization - Number of Visits 10   OT Start Time 1100   OT Stop Time 1155   OT Time Calculation (min) 55 min   Activity Tolerance Patient tolerated treatment well   Behavior During Therapy [facility] for tasks assessed/performed      Past Medical History  Diagnosis Date  . Essential hypertension, benign   . Mixed hyperlipidemia   . Esophageal reflux   . COPD (chronic obstructive pulmonary disease) ([place])   . History of pulmonary embolism     Multiple, status post IVC filter, on Coumadin  . History of DVT (deep vein thrombosis)   . GERD (gastroesophageal reflux disease)   . Osteoarthritis   . Factor V Leiden mutation ([place])   . Allergic rhinitis   . On home oxygen therapy     2 liters at night  . Transfusion history   . Obstructive sleep apnea     NPSG 05-29-07; AHI 32.9,cpap 7 to 9  . History of cardiac catheterization     Normal coronaries July 2015  . Persistent atrial fibrillation [facility])     Documented October 2016    Past Surgical History  Procedure Laterality Date  . Ivc filter    . Ganglion cyst excision    . Hernia abscess    . I&d hernia incisional abscess    . Endometrial ablation    . Rotator cuff repair    . Left heart catheterization with coronary angiogram N/A 08/20/2013    Procedure: LEFT HEART CATHETERIZATION WITH CORONARY ANGIOGRAM;  Surgeon: Blane Ohara, MD;  Location: [facility] CATH LAB;  Service: Cardiovascular;  Laterality: N/A;  . Hernia repair      umbilical  . Cholecystectomy      There were no vitals filed for this visit.  Visit Diagnosis:  Shoulder weakness  Pain in joint of left shoulder  Shoulder joint stiffness, left  Tight fascia      Subjective Assessment - 03/02/15 1132    Subjective  S: I really don't want to have surgery on my shoulder for this bone spur.   Currently in Pain? Yes   Pain Score 1    Pain Location Shoulder   Pain Orientation Left   Pain Descriptors / Indicators Aching;Sore   Pain Type Acute pain   Pain Onset More than a month ago   Pain Frequency Intermittent            [facility] OT Assessment - 03/02/15 1135    Assessment   Diagnosis Left shoulder pain   Precautions   Precautions None                  OT Treatments/Exercises (OP) - 03/02/15 1135    Exercises   Exercises Shoulder   Shoulder Exercises: Supine   Protraction PROM;5 reps;AAROM;15 reps   Horizontal ABduction PROM;5 reps;AAROM;15 reps  External Rotation PROM;5 reps;AAROM;15 reps   Internal Rotation PROM;5 reps;AAROM;15 reps   Flexion PROM;5 reps;AAROM;15 reps   ABduction PROM;5 reps;AAROM;15 reps   Shoulder Exercises: Seated   Protraction AAROM;12 reps   Horizontal ABduction AAROM;12 reps   External Rotation AAROM;12 reps   Internal Rotation AAROM;12 reps   Flexion AAROM;12 reps   Abduction AAROM;12 reps   Shoulder Exercises: ROM/Strengthening   Proximal Shoulder Strengthening, Supine 10X with 2 rest breaks and increased pain   Modalities   Modalities Electrical Stimulation;Moist Heat   Moist Heat Therapy   Number Minutes Moist Heat 10 Minutes   Moist Heat Location Shoulder   Electrical Stimulation   Electrical Stimulation Location Left shoulder   Electrical Stimulation Action interferential   Electrical Stimulation Parameters 26 CV   Electrical Stimulation Goals Pain   Manual Therapy   Manual  Therapy Myofascial release   Manual therapy comments Manual therapy completed prior to exercises.   Myofascial Release Myofascial release and manual stretching to left upper arm, trapezius, and scapularis region to decrease the fascial restrictions and increase joint mobility in a pain free zone.                   OT Short Term Goals - 02/17/15 1420    OT SHORT TERM GOAL #1   Title Patient will be educated and independent with HEP to increase functional use of LUE during daily tasks.   Time 4   Period Weeks   Status On-going   OT SHORT TERM GOAL #2   Title Patient will increase P/ROM of LUE to [facility] to increase ability to get shirts on and off.    Time 4   Period Weeks   Status On-going   OT SHORT TERM GOAL #3   Title Patient will increase LUE strength to 3/5 to increase ability to complete activities at waist level.   Time 4   Period Weeks   Status On-going   OT SHORT TERM GOAL #4   Title Patient will decrease pain during daily tasks to 5/10.   Time 4   Period Weeks   Status On-going   OT SHORT TERM GOAL #5   Title Patient will decrease fascial restrictions in left arm to min amount to increase functional mobility.    Time 4   Period Weeks   Status On-going           OT Long Term Goals - 02/17/15 1421    OT LONG TERM GOAL #1   Title Patient will return to highest level of independence with all daily and leisure tasks using LUE.   Time 8   Period Weeks   Status On-going   OT LONG TERM GOAL #2   Title Patient will increase A/ROM to [facility] to increase ability to reach into overhead cabinets with less difficulty.    Time 8   Period Weeks   Status On-going   OT LONG TERM GOAL #3   Title Patient will increase LUE strength to 4/5 to increase ability to complete tasks as shoulder level.   Time 8   Period Weeks   Status On-going   OT LONG TERM GOAL #4   Title Patient will decrease left shoulder pain to 2/10 or less during daily tasks.   Time 8   Period Weeks    Status On-going   OT LONG TERM GOAL #5   Title Patient will decrease left shoulder fascial restrictions to trace amount or less to increase functional mobility  needed for reaching tasks.   Time 8   Period Weeks   Status On-going               Plan - 03/02/15 1145    Clinical Impression Statement A: Increased repetitions with seated AA/ROM and patient was able to tolerate with some pain and discomfort. ES and moist heat used at end of session for pain management.    Plan P:  Add wall wash and UBE bike.         Problem List Patient Active Problem List   Diagnosis Date Noted  . Pulmonary embolism ([place]) 08/27/2013  . Exertional dyspnea 08/17/2013  . Chronic respiratory failure with hypoxia ([place]) 03/27/2010  . HYPERLIPIDEMIA 05/03/2007  . Essential hypertension, benign 05/03/2007  . Asthma, non-allergic 05/03/2007  . GERD 05/03/2007  . Obesity hypoventilation syndrome ([place]) 04/28/2007  . BRONCHITIS 04/28/2007  . Obstructive sleep apnea 04/28/2007    Ailene Ravel, OTR/L,CBIS  [phone]  03/02/2015, 11:48 AM  [place] [address] [place], [place], [address] Phone: [phone]   Fax:  [phone]  Name: RANDALYN ELLERS MRN: [id number] Date of Birth: Mar 05, 1950

## 2015-03-04 DIAGNOSIS — I509 Heart failure, unspecified: Secondary | ICD-10-CM | POA: Diagnosis not present

## 2015-03-04 DIAGNOSIS — I5032 Chronic diastolic (congestive) heart failure: Secondary | ICD-10-CM | POA: Diagnosis not present

## 2015-03-04 DIAGNOSIS — I2699 Other pulmonary embolism without acute cor pulmonale: Secondary | ICD-10-CM | POA: Diagnosis not present

## 2015-03-04 DIAGNOSIS — J45909 Unspecified asthma, uncomplicated: Secondary | ICD-10-CM | POA: Diagnosis not present

## 2015-03-04 DIAGNOSIS — G4733 Obstructive sleep apnea (adult) (pediatric): Secondary | ICD-10-CM | POA: Diagnosis not present

## 2015-03-04 DIAGNOSIS — I10 Essential (primary) hypertension: Secondary | ICD-10-CM | POA: Diagnosis not present

## 2015-03-06 ENCOUNTER — Ambulatory Visit (HOSPITAL_COMMUNITY): Payer: PPO | Admitting: Specialist

## 2015-03-06 DIAGNOSIS — Z7901 Long term (current) use of anticoagulants: Secondary | ICD-10-CM | POA: Diagnosis not present

## 2015-03-06 DIAGNOSIS — R29898 Other symptoms and signs involving the musculoskeletal system: Secondary | ICD-10-CM

## 2015-03-06 DIAGNOSIS — M25512 Pain in left shoulder: Secondary | ICD-10-CM

## 2015-03-06 DIAGNOSIS — M25612 Stiffness of left shoulder, not elsewhere classified: Secondary | ICD-10-CM

## 2015-03-06 NOTE — Therapy (Signed)
[place], [place], [address] Phone: [phone]   Fax:  [phone]  Occupational Therapy Treatment  Patient Details  Name: Rhonda Acevedo MRN: [id number] Date of Birth: [birth year] Referring Provider: Rhina Brackett  Encounter Date: 03/06/2015      OT End of Session - 03/06/15 1551    Visit Number 7   Number of Visits 16   Date for OT Re-Evaluation 04/16/15  mini reassess on 03/15/15   Authorization Type [employer] plan PPO - $15.00 copay   Authorization Time Period begore 10th visit   Authorization - Visit Number 7   Authorization - Number of Visits 10   OT Start Time 1520   OT Stop Time 1600   OT Time Calculation (min) 40 min   Activity Tolerance Patient tolerated treatment well   Behavior During Therapy [facility] for tasks assessed/performed      Past Medical History  Diagnosis Date  . Essential hypertension, benign   . Mixed hyperlipidemia   . Esophageal reflux   . COPD (chronic obstructive pulmonary disease) ([place])   . History of pulmonary embolism     Multiple, status post IVC filter, on Coumadin  . History of DVT (deep vein thrombosis)   . GERD (gastroesophageal reflux disease)   . Osteoarthritis   . Factor V Leiden mutation ([place])   . Allergic rhinitis   . On home oxygen therapy     2 liters at night  . Transfusion history   . Obstructive sleep apnea     NPSG 05-29-07; AHI 32.9,cpap 7 to 9  . History of cardiac catheterization     Normal coronaries July 2015  . Persistent atrial fibrillation [facility])     Documented October 2016    Past Surgical History  Procedure Laterality Date  . Ivc filter    . Ganglion cyst excision    . Hernia abscess    . I&d hernia incisional abscess    . Endometrial ablation    . Rotator cuff repair    . Left heart catheterization with coronary angiogram N/A 08/20/2013    Procedure: LEFT HEART CATHETERIZATION WITH CORONARY ANGIOGRAM;  Surgeon: Blane Ohara, MD;  Location: [facility] CATH LAB;  Service: Cardiovascular;  Laterality: N/A;  . Hernia repair      umbilical  . Cholecystectomy      There were no vitals filed for this visit.  Visit Diagnosis:  Shoulder weakness  Pain in joint of left shoulder  Shoulder joint stiffness, left      Subjective Assessment - 03/06/15 1521    Subjective  S:  Its still sore.  I do think therapy is helping it.  I can do alot more now like closing the shower door.    Currently in Pain? Yes   Pain Score 2    Pain Location Shoulder   Pain Orientation Left   Pain Descriptors / Indicators Aching;Sore   Pain Type Acute pain   Pain Onset More than a month ago   Pain Frequency Intermittent   Aggravating Factors  movement   Pain Relieving Factors resting and ice   Effect of Pain on Daily Activities unable to use left arm fully with daily activities             [facility] OT Assessment - 03/06/15 0001    Assessment   Diagnosis Left shoulder pain   Precautions   Precautions None   Restrictions   Weight Bearing Restrictions No  Balance Screen   Has the patient fallen in the past 6 months No                  OT Treatments/Exercises (OP) - 03/06/15 0001    Exercises   Exercises Shoulder   Shoulder Exercises: Supine   Protraction PROM;5 reps;AAROM;15 reps   Horizontal ABduction PROM;5 reps;AAROM;15 reps   External Rotation PROM;5 reps;AAROM;15 reps   Internal Rotation PROM;5 reps;AAROM;15 reps   Flexion PROM;5 reps;AAROM;15 reps   ABduction PROM;5 reps;AAROM;15 reps   Other Supine Exercises serratus anterior punch 10 times a/ROM   Shoulder Exercises: Seated   Elevation AROM;15 reps   Extension AROM;15 reps   Row AROM;15 reps   Protraction AAROM;15 reps   Horizontal ABduction AAROM;15 reps   External Rotation AAROM;15 reps   Internal Rotation AAROM;15 reps   Flexion AAROM;15 reps   Abduction AAROM;15 reps   Shoulder Exercises: ROM/Strengthening   UBE (Upper Arm Bike) 1' forward  and 1' reverse at 1.0 resistance.    Wall Wash 1 minute  attempted 1 1/2 minute and is now 1 minute   Proximal Shoulder Strengthening, Supine 10X with only one rest break this date.    Manual Therapy   Manual Therapy Myofascial release   Manual therapy comments Manual therapy completed prior to exercises.   Myofascial Release Myofascial release and manual stretching to left upper arm, trapezius, and scapularis region to decrease the fascial restrictions and increase joint mobility in a pain free zone.                   OT Short Term Goals - 02/17/15 1420    OT SHORT TERM GOAL #1   Title Patient will be educated and independent with HEP to increase functional use of LUE during daily tasks.   Time 4   Period Weeks   Status On-going   OT SHORT TERM GOAL #2   Title Patient will increase P/ROM of LUE to [facility] to increase ability to get shirts on and off.    Time 4   Period Weeks   Status On-going   OT SHORT TERM GOAL #3   Title Patient will increase LUE strength to 3/5 to increase ability to complete activities at waist level.   Time 4   Period Weeks   Status On-going   OT SHORT TERM GOAL #4   Title Patient will decrease pain during daily tasks to 5/10.   Time 4   Period Weeks   Status On-going   OT SHORT TERM GOAL #5   Title Patient will decrease fascial restrictions in left arm to min amount to increase functional mobility.    Time 4   Period Weeks   Status On-going           OT Long Term Goals - 02/17/15 1421    OT LONG TERM GOAL #1   Title Patient will return to highest level of independence with all daily and leisure tasks using LUE.   Time 8   Period Weeks   Status On-going   OT LONG TERM GOAL #2   Title Patient will increase A/ROM to [facility] to increase ability to reach into overhead cabinets with less difficulty.    Time 8   Period Weeks   Status On-going   OT LONG TERM GOAL #3   Title Patient will increase LUE strength to 4/5 to increase ability to  complete tasks as shoulder level.   Time 8   Period Weeks  Status On-going   OT LONG TERM GOAL #4   Title Patient will decrease left shoulder pain to 2/10 or less during daily tasks.   Time 8   Period Weeks   Status On-going   OT LONG TERM GOAL #5   Title Patient will decrease left shoulder fascial restrictions to trace amount or less to increase functional mobility needed for reaching tasks.   Time 8   Period Weeks   Status On-going               Plan - 03/06/15 1552    Clinical Impression Statement A:  Patient has audible clicking with P/ROM of shoulder into flexion, which may be caused by bone spurs.  Able to complete supine proximal shoulder strengthening with only 1 rest break vs 2 at previous session.    Plan P: Attempt to increase sustained activity tolearnce with wall wash and UBE.  Attempt PVC pipe exercise to build AA/ROM.   Consulted and Agree with Plan of Care Patient        Problem List Patient Active Problem List   Diagnosis Date Noted  . Pulmonary embolism ([place]) 08/27/2013  . Exertional dyspnea 08/17/2013  . Chronic respiratory failure with hypoxia ([place]) 03/27/2010  . HYPERLIPIDEMIA 05/03/2007  . Essential hypertension, benign 05/03/2007  . Asthma, non-allergic 05/03/2007  . GERD 05/03/2007  . Obesity hypoventilation syndrome ([place]) 04/28/2007  . BRONCHITIS 04/28/2007  . Obstructive sleep apnea 04/28/2007    Vangie Bicker, OTR/L [phone]  03/06/2015, 4:11 PM  [place] [address] [place], [place], [address] Phone: [phone]   Fax:  [phone]  Name: Rhonda Acevedo MRN: [id number] Date of Birth: [birth year]

## 2015-03-09 ENCOUNTER — Ambulatory Visit (HOSPITAL_COMMUNITY): Payer: PPO

## 2015-03-09 ENCOUNTER — Telehealth (HOSPITAL_COMMUNITY): Payer: Self-pay

## 2015-03-09 DIAGNOSIS — R29898 Other symptoms and signs involving the musculoskeletal system: Secondary | ICD-10-CM

## 2015-03-09 DIAGNOSIS — M25612 Stiffness of left shoulder, not elsewhere classified: Secondary | ICD-10-CM

## 2015-03-09 DIAGNOSIS — M629 Disorder of muscle, unspecified: Secondary | ICD-10-CM

## 2015-03-09 DIAGNOSIS — M25512 Pain in left shoulder: Secondary | ICD-10-CM

## 2015-03-09 DIAGNOSIS — M6289 Other specified disorders of muscle: Secondary | ICD-10-CM

## 2015-03-09 NOTE — Patient Instructions (Signed)
1) Seated Row   Sit up straight with elbows by your sides. Pull back with shoulders/elbows, keeping forearms straight, as if pulling back on the reins of a horse. Squeeze shoulder blades together. Repeat 10___times, _2___sets/day    2) Shoulder Elevation    Sit up straight with arms by your sides. Slowly bring your shoulders up towards your ears. Repeat_10__times, __2__ sets/day    3) Shoulder Extension    Sit up straight with both arms by your side, draw your arms back behind your waist. Keep your elbows straight. Repeat _10___times, _2___sets/day.   (Home) Extension: Isometric / Bilateral Arm Retraction - Sitting   Facing anchor, hold hands and elbow at shoulder height, with elbow bent.  Pull arms back to squeeze shoulder blades together. Repeat 10-15 times.  Copyright  VHI. All rights reserved.   (Home) Retraction: Row - Bilateral (Anchor)   Facing anchor, arms reaching forward, pull hands toward stomach, keeping elbows bent and at your sides and pinching shoulder blades together. Repeat 10-15 times.  Copyright  VHI. All rights reserved.   (Clinic) Extension / Flexion (Assist)   Face anchor, pull arms back, keeping elbow straight, and squeze shoulder blades together. Repeat 10-15 times.   Copyright  VHI. All rights reserved.

## 2015-03-09 NOTE — Therapy (Signed)
[place], [place], [address] Phone: [phone]   Fax:  [phone]  Occupational Therapy Treatment and reassessment  Patient Details  Name: Rhonda Acevedo MRN: [id number] Date of Birth: 1950-12-25 Referring Provider: Rhina Brackett  Encounter Date: 03/09/2015      OT End of Session - 03/09/15 1643    Visit Number 8   Number of Visits 16   Authorization Type [employer] PPO - $15.00 copay   Authorization Time Period begore 10th visit   Authorization - Visit Number 8   Authorization - Number of Visits 10   OT Start Time 7353   OT Stop Time 1430   OT Time Calculation (min) 45 min   Activity Tolerance Patient tolerated treatment well   Behavior During Therapy [facility] for tasks assessed/performed      Past Medical History  Diagnosis Date  . Essential hypertension, benign   . Mixed hyperlipidemia   . Esophageal reflux   . COPD (chronic obstructive pulmonary disease) ([place])   . History of pulmonary embolism     Multiple, status post IVC filter, on Coumadin  . History of DVT (deep vein thrombosis)   . GERD (gastroesophageal reflux disease)   . Osteoarthritis   . Factor V Leiden mutation ([place])   . Allergic rhinitis   . On home oxygen therapy     2 liters at night  . Transfusion history   . Obstructive sleep apnea     NPSG 05-29-07; AHI 32.9,cpap 7 to 9  . History of cardiac catheterization     Normal coronaries July 2015  . Persistent atrial fibrillation [facility])     Documented October 2016    Past Surgical History  Procedure Laterality Date  . Ivc filter    . Ganglion cyst excision    . Hernia abscess    . I&d hernia incisional abscess    . Endometrial ablation    . Rotator cuff repair    . Left heart catheterization with coronary angiogram N/A 08/20/2013    Procedure: LEFT HEART CATHETERIZATION WITH CORONARY ANGIOGRAM;  Surgeon: Blane Ohara, MD;  Location: [facility] CATH LAB;  Service:  Cardiovascular;  Laterality: N/A;  . Hernia repair      umbilical  . Cholecystectomy      There were no vitals filed for this visit.  Visit Diagnosis:  Shoulder weakness  Pain in joint of left shoulder  Shoulder joint stiffness, left  Tight fascia      Subjective Assessment - 03/09/15 1641    Subjective  S: The Doctor said to stop coming until I go back and they can evaluate my shoulder and see what's going on.    Special Tests FOTO score: 49/100   Currently in Pain? Yes   Pain Score 2    Pain Location Shoulder   Pain Orientation Left   Pain Descriptors / Indicators Aching;Sore   Pain Type Acute pain            [facility] OT Assessment - 03/09/15 1348    Assessment   Diagnosis Left shoulder pain   Precautions   Precautions None   AROM   Overall AROM Comments Assessed seated. IR/er adducted.   AROM Assessment Site Shoulder   Right/Left Shoulder Left   Left Shoulder Flexion 180 Degrees  previous: 97   Left Shoulder ABduction 96 Degrees  previous: 75   Left Shoulder Internal Rotation 90 Degrees  previous: 90   Left Shoulder External  Rotation 56 Degrees  previous: 41   PROM   Overall PROM Comments Assessed supine. IR/er adducted   PROM Assessment Site Shoulder   Right/Left Shoulder Left   Left Shoulder Flexion 180 Degrees  previous: 97   Left Shoulder ABduction 180 Degrees  previous: 90   Left Shoulder Internal Rotation 90 Degrees  previous: 90   Left Shoulder External Rotation 80 Degrees  previous: 90   Strength   Overall Strength Comments Assessed seated. IR/er adducted.   Strength Assessment Site Shoulder   Right/Left Shoulder Left   Left Shoulder Flexion 3/5  previous; 3-/5   Left Shoulder ABduction 3-/5  previous: 3-/5   Left Shoulder Internal Rotation 5/5  previous: 3/5   Left Shoulder External Rotation 4-/5  previous: 4-/5                  OT Treatments/Exercises (OP) - 03/09/15 1642    Exercises   Exercises Shoulder   Shoulder  Exercises: Supine   Protraction PROM;5 reps   Horizontal ABduction PROM;5 reps   External Rotation PROM;5 reps   Internal Rotation PROM;5 reps   Flexion PROM;5 reps   ABduction PROM;5 reps   Shoulder Exercises: Standing   Extension Theraband;10 reps   Theraband Level (Shoulder Extension) Level 2 (Red)   Row Theraband;10 reps   Theraband Level (Shoulder Row) Level 2 (Red)   Retraction Theraband;10 reps   Theraband Level (Shoulder Retraction) Level 2 (Red)   Manual Therapy   Manual Therapy Myofascial release   Manual therapy comments Manual therapy completed prior to exercises.   Myofascial Release Myofascial release and manual stretching to left upper arm, trapezius, and scapularis region to decrease the fascial restrictions and increase joint mobility in a pain free zone.                 OT Education - 03/09/15 1643    Education provided Yes   Education Details scapular A/ROM exercises and scapular theraband strengthening (red) exercises   Person(s) Educated Patient   Methods Explanation;Demonstration;Verbal cues;Handout   Comprehension Returned demonstration;Verbalized understanding          OT Short Term Goals - 03/09/15 1409    OT SHORT TERM GOAL #1   Title Patient will be educated and independent with HEP to increase functional use of LUE during daily tasks.   Time 4   Period Weeks   Status Achieved   OT SHORT TERM GOAL #2   Title Patient will increase P/ROM of LUE to [facility] to increase ability to get shirts on and off.    Time 4   Period Weeks   Status Achieved   OT SHORT TERM GOAL #3   Title Patient will increase LUE strength to 3/5 to increase ability to complete activities at waist level.   Time 4   Period Weeks   Status Partially Met   OT SHORT TERM GOAL #4   Title Patient will decrease pain during daily tasks to 5/10.   Time 4   Period Weeks   Status Achieved   OT SHORT TERM GOAL #5   Title Patient will decrease fascial restrictions in left arm to  min amount to increase functional mobility.    Time 4   Period Weeks   Status Partially Met           OT Long Term Goals - 03/09/15 1645    OT LONG TERM GOAL #1   Title Patient will return to highest level of independence with  all daily and leisure tasks using LUE.   Time 8   Period Weeks   Status Not Met   OT LONG TERM GOAL #2   Title Patient will increase A/ROM to [facility] to increase ability to reach into overhead cabinets with less difficulty.    Time 8   Period Weeks   Status Not Met   OT LONG TERM GOAL #3   Title Patient will increase LUE strength to 4/5 to increase ability to complete tasks as shoulder level.   Time 8   Period Weeks   Status Not Met   OT LONG TERM GOAL #4   Title Patient will decrease left shoulder pain to 2/10 or less during daily tasks.   Time 8   Period Weeks   Status Not Met   OT LONG TERM GOAL #5   Title Patient will decrease left shoulder fascial restrictions to trace amount or less to increase functional mobility needed for reaching tasks.   Time 8   Period Weeks   Status Not Met               Plan - 03/22/2015 1644    Clinical Impression Statement A: reassessment and discharge completed today per physician's request. Patient recently spoke with MD office about recent "catching" sensation and the physician prefers if she stops therapy until her follow up appt to assess her shoulder. Patient has met 3/5 short term goals with the remaining partially met.    Plan P: D/C with HEP          G-Codes - 03-22-2015 1645    Functional Assessment Tool Used FOTO 49/100   Functional Limitation Carrying, moving and handling objects   Carrying, Moving and Handling Objects Goal Status (W6203) At least 20 percent but less than 40 percent impaired, limited or restricted   Carrying, Moving and Handling Objects Discharge Status [phone]) At least 40 percent but less than 60 percent impaired, limited or restricted      Problem List Patient Active Problem  List   Diagnosis Date Noted  . Pulmonary embolism ([place]) 08/27/2013  . Exertional dyspnea 08/17/2013  . Chronic respiratory failure with hypoxia ([place]) 03/27/2010  . HYPERLIPIDEMIA 05/03/2007  . Essential hypertension, benign 05/03/2007  . Asthma, non-allergic 05/03/2007  . GERD 05/03/2007  . Obesity hypoventilation syndrome ([place]) 04/28/2007  . BRONCHITIS 04/28/2007  . Obstructive sleep apnea 04/28/2007   OCCUPATIONAL THERAPY DISCHARGE SUMMARY  Visits from Start of Care: 8  Current functional level related to goals / functional outcomes: See above   Remaining deficits: Patient continues to show deficits related to A/ROM and strength as well as increased pain and a feeling of "catching" in the left shoulder when raising it above 90 degrees.    Education / Equipment: Scapular A/ROM exercises and scapular strengthening exercises Plan: Patient agrees to discharge.  Patient goals were not met. Patient is being discharged due to the physician's request.  ?????       Ailene Ravel, OTR/L,CBIS  [phone]  22-Mar-2015, 4:47 PM  [place] [address] [place], [place], [address] Phone: [phone]   Fax:  [phone]  Name: Rhonda Acevedo MRN: [id number] Date of Birth: [birth year]

## 2015-03-09 NOTE — Telephone Encounter (Signed)
Possible D/c per patient, she called to state that MD said for her to stop OT. She wants to be measured and D/c today. NF

## 2015-03-13 ENCOUNTER — Encounter (HOSPITAL_COMMUNITY): Payer: PPO

## 2015-03-15 ENCOUNTER — Encounter: Payer: Self-pay | Admitting: Internal Medicine

## 2015-03-15 ENCOUNTER — Encounter (HOSPITAL_COMMUNITY): Payer: PPO

## 2015-03-15 NOTE — Telephone Encounter (Signed)
CY do you still have DL results with chart notes on this patient. I will need to fax copy of DL to Dr Husain's office. Thanks.

## 2015-03-16 NOTE — Telephone Encounter (Signed)
Called the scan center and spoke with Almyra Free  She will look for this and call us back Will await her call

## 2015-03-16 NOTE — Telephone Encounter (Signed)
Please contact scanning dept to see if they can get most recent DL that would have been sent to scan taken care of today. If they do not have any item on patient; we will need to contact patient about her DME company-appears that [facility] does not take care of CPAP for patient and [facility] has not taken care of CPAP for patient since 2015. Thanks.

## 2015-03-20 ENCOUNTER — Encounter (HOSPITAL_COMMUNITY): Payer: PPO | Admitting: Specialist

## 2015-03-20 NOTE — Telephone Encounter (Signed)
Paper not at the scan center  [facility] for the pt to find out DME

## 2015-03-21 DIAGNOSIS — M25512 Pain in left shoulder: Secondary | ICD-10-CM | POA: Diagnosis not present

## 2015-03-21 DIAGNOSIS — M1711 Unilateral primary osteoarthritis, right knee: Secondary | ICD-10-CM | POA: Diagnosis not present

## 2015-03-21 DIAGNOSIS — M1712 Unilateral primary osteoarthritis, left knee: Secondary | ICD-10-CM | POA: Diagnosis not present

## 2015-03-21 DIAGNOSIS — M75112 Incomplete rotator cuff tear or rupture of left shoulder, not specified as traumatic: Secondary | ICD-10-CM | POA: Diagnosis not present

## 2015-03-23 ENCOUNTER — Encounter (HOSPITAL_COMMUNITY): Payer: PPO

## 2015-03-27 ENCOUNTER — Encounter (HOSPITAL_COMMUNITY): Payer: PPO

## 2015-03-28 DIAGNOSIS — M1712 Unilateral primary osteoarthritis, left knee: Secondary | ICD-10-CM | POA: Diagnosis not present

## 2015-03-28 DIAGNOSIS — M1711 Unilateral primary osteoarthritis, right knee: Secondary | ICD-10-CM | POA: Diagnosis not present

## 2015-03-28 DIAGNOSIS — M67912 Unspecified disorder of synovium and tendon, left shoulder: Secondary | ICD-10-CM | POA: Diagnosis not present

## 2015-03-30 ENCOUNTER — Encounter (HOSPITAL_COMMUNITY): Payer: PPO

## 2015-04-01 DIAGNOSIS — I2699 Other pulmonary embolism without acute cor pulmonale: Secondary | ICD-10-CM | POA: Diagnosis not present

## 2015-04-01 DIAGNOSIS — I509 Heart failure, unspecified: Secondary | ICD-10-CM | POA: Diagnosis not present

## 2015-04-01 DIAGNOSIS — I10 Essential (primary) hypertension: Secondary | ICD-10-CM | POA: Diagnosis not present

## 2015-04-01 DIAGNOSIS — G4733 Obstructive sleep apnea (adult) (pediatric): Secondary | ICD-10-CM | POA: Diagnosis not present

## 2015-04-01 DIAGNOSIS — J45909 Unspecified asthma, uncomplicated: Secondary | ICD-10-CM | POA: Diagnosis not present

## 2015-04-01 DIAGNOSIS — I5032 Chronic diastolic (congestive) heart failure: Secondary | ICD-10-CM | POA: Diagnosis not present

## 2015-04-04 DIAGNOSIS — M1712 Unilateral primary osteoarthritis, left knee: Secondary | ICD-10-CM | POA: Diagnosis not present

## 2015-04-04 DIAGNOSIS — M1711 Unilateral primary osteoarthritis, right knee: Secondary | ICD-10-CM | POA: Diagnosis not present

## 2015-04-06 ENCOUNTER — Other Ambulatory Visit: Payer: Self-pay | Admitting: Internal Medicine

## 2015-04-10 DIAGNOSIS — Z1389 Encounter for screening for other disorder: Secondary | ICD-10-CM | POA: Diagnosis not present

## 2015-04-10 DIAGNOSIS — Z0000 Encounter for general adult medical examination without abnormal findings: Secondary | ICD-10-CM | POA: Diagnosis not present

## 2015-04-10 DIAGNOSIS — I10 Essential (primary) hypertension: Secondary | ICD-10-CM | POA: Diagnosis not present

## 2015-04-10 DIAGNOSIS — Z6841 Body Mass Index (BMI) 40.0 and over, adult: Secondary | ICD-10-CM | POA: Diagnosis not present

## 2015-04-10 DIAGNOSIS — E7800 Pure hypercholesterolemia, unspecified: Secondary | ICD-10-CM | POA: Diagnosis not present

## 2015-04-10 DIAGNOSIS — I5032 Chronic diastolic (congestive) heart failure: Secondary | ICD-10-CM | POA: Diagnosis not present

## 2015-04-10 DIAGNOSIS — G4733 Obstructive sleep apnea (adult) (pediatric): Secondary | ICD-10-CM | POA: Diagnosis not present

## 2015-04-10 DIAGNOSIS — J4540 Moderate persistent asthma, uncomplicated: Secondary | ICD-10-CM | POA: Diagnosis not present

## 2015-04-10 DIAGNOSIS — I2699 Other pulmonary embolism without acute cor pulmonale: Secondary | ICD-10-CM | POA: Diagnosis not present

## 2015-04-10 DIAGNOSIS — I4891 Unspecified atrial fibrillation: Secondary | ICD-10-CM | POA: Diagnosis not present

## 2015-04-10 DIAGNOSIS — R7309 Other abnormal glucose: Secondary | ICD-10-CM | POA: Diagnosis not present

## 2015-04-11 DIAGNOSIS — M1712 Unilateral primary osteoarthritis, left knee: Secondary | ICD-10-CM | POA: Diagnosis not present

## 2015-04-11 DIAGNOSIS — M1711 Unilateral primary osteoarthritis, right knee: Secondary | ICD-10-CM | POA: Diagnosis not present

## 2015-04-14 ENCOUNTER — Other Ambulatory Visit: Payer: Self-pay | Admitting: Gastroenterology

## 2015-04-18 DIAGNOSIS — Z7901 Long term (current) use of anticoagulants: Secondary | ICD-10-CM | POA: Diagnosis not present

## 2015-04-18 DIAGNOSIS — M170 Bilateral primary osteoarthritis of knee: Secondary | ICD-10-CM | POA: Diagnosis not present

## 2015-05-02 DIAGNOSIS — I10 Essential (primary) hypertension: Secondary | ICD-10-CM | POA: Diagnosis not present

## 2015-05-02 DIAGNOSIS — I509 Heart failure, unspecified: Secondary | ICD-10-CM | POA: Diagnosis not present

## 2015-05-02 DIAGNOSIS — I2699 Other pulmonary embolism without acute cor pulmonale: Secondary | ICD-10-CM | POA: Diagnosis not present

## 2015-05-02 DIAGNOSIS — G4733 Obstructive sleep apnea (adult) (pediatric): Secondary | ICD-10-CM | POA: Diagnosis not present

## 2015-05-02 DIAGNOSIS — J45909 Unspecified asthma, uncomplicated: Secondary | ICD-10-CM | POA: Diagnosis not present

## 2015-05-02 DIAGNOSIS — I5032 Chronic diastolic (congestive) heart failure: Secondary | ICD-10-CM | POA: Diagnosis not present

## 2015-05-08 ENCOUNTER — Encounter: Payer: Self-pay | Admitting: Cardiology

## 2015-05-08 ENCOUNTER — Ambulatory Visit (INDEPENDENT_AMBULATORY_CARE_PROVIDER_SITE_OTHER): Payer: PPO | Admitting: Cardiology

## 2015-05-08 VITALS — BP 117/61 | HR 85 | Ht 66.0 in | Wt 394.0 lb

## 2015-05-08 DIAGNOSIS — G4733 Obstructive sleep apnea (adult) (pediatric): Secondary | ICD-10-CM

## 2015-05-08 DIAGNOSIS — Z79899 Other long term (current) drug therapy: Secondary | ICD-10-CM | POA: Diagnosis not present

## 2015-05-08 DIAGNOSIS — D6859 Other primary thrombophilia: Secondary | ICD-10-CM

## 2015-05-08 DIAGNOSIS — I480 Paroxysmal atrial fibrillation: Secondary | ICD-10-CM

## 2015-05-08 DIAGNOSIS — I10 Essential (primary) hypertension: Secondary | ICD-10-CM

## 2015-05-08 NOTE — Patient Instructions (Signed)
Your physician recommends that you continue on your current medications as directed. Please refer to the Current Medication list given to you today.   Your physician wants you to follow-up in: 6  Months with DR. MCDOWELL.  You will receive a reminder letter in the mail two months in advance. If you don't receive a letter, please call our office to schedule the follow-up appointment.   Thanks for choosing [facility]!!!

## 2015-05-08 NOTE — Progress Notes (Signed)
Cardiology Office Note  Date: 05/08/2015   ID: Rhonda Acevedo, DOB 06-27-1950, MRN [id number]  PCP: Wenda Low, MD  Primary Cardiologist: Rozann Lesches, MD   Chief Complaint  Patient presents with  . Atrial Fibrillation    History of Present Illness: Rhonda Acevedo is a 65 y.o. female last seen in December 2016. She presents for a routine follow-up visit. Still doing well without any significant sense of palpitations, no chest pain or unusual shortness of breath.  She continues on Coumadin for stroke prophylaxis, followed by PCP. No reported bleeding problems.  I reviewed her medications. She continues on flecainide and Cardizem CD for paroxysmal atrial fibrillation. Follow-up ECG today shows sinus rhythm with low voltage, poor R-wave progression, nonspecific T-wave changes. QRS duration is 107 ms.  Past Medical History  Diagnosis Date  . Essential hypertension, benign   . Mixed hyperlipidemia   . Esophageal reflux   . COPD (chronic obstructive pulmonary disease) ([place])   . History of pulmonary embolism     Multiple, status post IVC filter, on Coumadin  . History of DVT (deep vein thrombosis)   . GERD (gastroesophageal reflux disease)   . Osteoarthritis   . Factor V Leiden mutation ([place])   . Allergic rhinitis   . On home oxygen therapy     2 liters at night  . Transfusion history   . Obstructive sleep apnea     NPSG 05-29-07; AHI 32.9,cpap 7 to 9  . History of cardiac catheterization     Normal coronaries July 2015  . Persistent atrial fibrillation [facility])     Documented October 2016    Past Surgical History  Procedure Laterality Date  . Ivc filter    . Ganglion cyst excision    . Hernia abscess    . I&d hernia incisional abscess    . Endometrial ablation    . Rotator cuff repair    . Left heart catheterization with coronary angiogram N/A 08/20/2013    Procedure: LEFT HEART CATHETERIZATION WITH CORONARY ANGIOGRAM;  Surgeon: Blane Ohara, MD;  Location: [facility]  CATH LAB;  Service: Cardiovascular;  Laterality: N/A;  . Hernia repair      umbilical  . Cholecystectomy      Current Outpatient Prescriptions  Medication Sig Dispense Refill  . Acetaminophen (TYLENOL ARTHRITIS PAIN PO) Take 2 tablets by mouth 2 (two) times daily as needed (pain). Reported on 02/15/2015    . albuterol (ACCUNEB) 1.25 MG/3ML nebulizer solution Take 1 ampule by nebulization every 6 (six) hours as needed for wheezing or shortness of breath.     Marland Kitchen albuterol (PROAIR HFA) 108 (90 BASE) MCG/ACT inhaler Inhale 2 puffs into the lungs 4 (four) times daily as needed for wheezing or shortness of breath.     . Calcium Carbonate-Vitamin D (CALCIUM-VITAMIN D) 600-200 MG-UNIT CAPS Take 1 capsule by mouth daily. Reported on 02/15/2015    . cetirizine (ZYRTEC) 10 MG tablet Take 10 mg by mouth daily. Reported on 02/15/2015    . Cholecalciferol (VITAMIN D3) 2000 UNITS TABS Take 2,000 Units by mouth daily. Reported on 02/15/2015    . diltiazem (CARDIZEM CD) 300 MG 24 hr capsule Take 300 mg by mouth daily.    . famotidine (PEPCID) 20 MG tablet Take 40 mg by mouth 2 (two) times daily.     . flecainide (TAMBOCOR) 50 MG tablet Take 1 tablet (50 mg total) by mouth 2 (two) times daily. 180 tablet 3  . fluticasone (FLONASE) 50 MCG/ACT nasal  spray Place 1 spray into the nose 2 (two) times daily.     . Fluticasone-Salmeterol (ADVAIR) 500-50 MCG/DOSE AEPB Inhale 1 puff into the lungs every 12 (twelve) hours.    . furosemide (LASIX) 20 MG tablet Take 40 mg by mouth daily.     Marland Kitchen HYDROcodone-acetaminophen (NORCO/VICODIN) 5-325 MG tablet Take 1 tablet by mouth every 6 (six) hours as needed for moderate pain.    Marland Kitchen losartan (COZAAR) 100 MG tablet Take 50 mg by mouth at bedtime.     Marland Kitchen oxybutynin (DITROPAN) 5 MG tablet Take 5 mg by mouth 2 (two) times daily.    . polyethylene glycol powder (GLYCOLAX/MIRALAX) powder Take 17 g by mouth at bedtime.     . pravastatin (PRAVACHOL) 80 MG tablet Take 80 mg by mouth at bedtime.      Marland Kitchen PULMICORT FLEXHALER 180 MCG/ACT inhaler INHALE TWO PUFFS BY MOUTH THEN RINSE MOUTH TWICE DAILY -MAINTENANCE 1 Inhaler 5  . traMADol (ULTRAM) 50 MG tablet Take 50 mg by mouth every 6 (six) hours as needed for moderate pain.     . vitamin C (ASCORBIC ACID) 500 MG tablet Take 500 mg by mouth daily. Reported on 02/15/2015    . warfarin (COUMADIN) 10 MG tablet Take 10 mg by mouth daily at 6 PM. Take one tablet daily except none of Friday     No current facility-administered medications for this visit.   Allergies:  Adhesive and Benazepril hcl   Social History: The patient  reports that she has never smoked. She has never used smokeless tobacco. She reports that she does not drink alcohol or use illicit drugs.   ROS:  Please see the history of present illness. Otherwise, complete review of systems is positive for chronic back pain.  All other systems are reviewed and negative.   Physical Exam: VS:  BP 117/61 mmHg  Pulse 85  Ht 5\' 6"  (1.676 m)  Wt 394 lb (178.717 kg)  BMI 63.62 kg/m2  SpO2 94%, BMI Body mass index is 63.62 kg/(m^2).  Wt Readings from Last 3 Encounters:  05/08/15 394 lb (178.717 kg)  02/13/15 403 lb (182.8 kg)  12/30/14 399 lb (180.985 kg)    Morbidly obese woman, appears comfortable at rest. HEENT: Conjunctiva and lids normal, oropharynx clear. Neck: Supple, no elevated JVP or carotid bruits, no thyromegaly. Lungs: Decreased breath sounds without wheezing, nonlabored breathing at rest. Cardiac: Distant, regular rate and rhythm, no S3, soft systolic murmur, no pericardial rub. Abdomen: Morbidly obese with pannus, nontender, bowel sounds present, no guarding or rebound. Extremities: Chronic appearing edema is mild with associated adipose tissue, distal pulses 2+.  ECG: I personally reviewed the prior tracing from 12/19/2014 which showed sinus bradycardia with QRS duration 92 ms and decreased R wave progression.  Recent Labwork: 11/21/2014: ALT 15; AST 23   Other  Studies Reviewed Today:  Echocardiogram 11/02/2014: Study Conclusions  - Left ventricle: The cavity size was normal. Wall thickness was normal. Systolic function was normal. The estimated ejection fraction was in the range of 55% to 60%. Wall motion was normal; there were no regional wall motion abnormalities. - Left atrium: The atrium was mildly to moderately dilated. - Right atrium: The atrium was mildly dilated.  Chest x-ray 11/01/2014: FINDINGS: Mediastinum and hilar structures normal. Cardiomegaly with mild pulmonary venous congestion. Low lung volumes with mild basilar atelectasis. No pleural effusion or pneumothorax. Degenerative changes thoracic spine.  IMPRESSION: 1. Cardiomegaly is mild pulmonary venous congestion.  2. Low lung volumes with  mild basilar atelectasis.  Assessment and Plan:  1. Paroxysmal atrial fibrillation, currently maintaining sinus rhythm. Plan to continue current regimen including flecainide, Cardizem CD, and Coumadin. ECG reviewed.  2. Factor V Leiden mutation with history of recurrent pulmonary emboli status post IVC filter. She is on chronic Coumadin which is followed by Dr. [name].  3. Essential hypertension, blood pressure is well controlled today.  4. Obstructive sleep apnea, on CPAP.  Current medicines were reviewed with the patient today.   Orders Placed This Encounter  Procedures  . EKG 12-Lead    Disposition: FU with me in 6 months.   Signed, Satira Sark, MD, [facility] 05/08/2015 1:15 PM    [facility] [address] [place], [place] [address] Phone: [phone]; Fax: [phone]

## 2015-05-15 ENCOUNTER — Other Ambulatory Visit: Payer: Self-pay | Admitting: Gastroenterology

## 2015-05-16 DIAGNOSIS — M1711 Unilateral primary osteoarthritis, right knee: Secondary | ICD-10-CM | POA: Diagnosis not present

## 2015-05-16 DIAGNOSIS — M1712 Unilateral primary osteoarthritis, left knee: Secondary | ICD-10-CM | POA: Diagnosis not present

## 2015-05-16 DIAGNOSIS — Z7901 Long term (current) use of anticoagulants: Secondary | ICD-10-CM | POA: Diagnosis not present

## 2015-05-23 DIAGNOSIS — Z7901 Long term (current) use of anticoagulants: Secondary | ICD-10-CM | POA: Diagnosis not present

## 2015-05-29 ENCOUNTER — Ambulatory Visit (HOSPITAL_COMMUNITY): Admit: 2015-05-29 | Payer: PPO | Admitting: Gastroenterology

## 2015-05-29 ENCOUNTER — Encounter (HOSPITAL_COMMUNITY): Payer: Self-pay

## 2015-05-29 SURGERY — COLONOSCOPY WITH PROPOFOL
Anesthesia: Monitor Anesthesia Care

## 2015-06-01 DIAGNOSIS — I10 Essential (primary) hypertension: Secondary | ICD-10-CM | POA: Diagnosis not present

## 2015-06-01 DIAGNOSIS — G4733 Obstructive sleep apnea (adult) (pediatric): Secondary | ICD-10-CM | POA: Diagnosis not present

## 2015-06-01 DIAGNOSIS — I2699 Other pulmonary embolism without acute cor pulmonale: Secondary | ICD-10-CM | POA: Diagnosis not present

## 2015-06-01 DIAGNOSIS — I509 Heart failure, unspecified: Secondary | ICD-10-CM | POA: Diagnosis not present

## 2015-06-01 DIAGNOSIS — J45909 Unspecified asthma, uncomplicated: Secondary | ICD-10-CM | POA: Diagnosis not present

## 2015-06-01 DIAGNOSIS — I5032 Chronic diastolic (congestive) heart failure: Secondary | ICD-10-CM | POA: Diagnosis not present

## 2015-06-07 ENCOUNTER — Encounter (HOSPITAL_COMMUNITY): Payer: Self-pay | Admitting: *Deleted

## 2015-06-12 DIAGNOSIS — Z7901 Long term (current) use of anticoagulants: Secondary | ICD-10-CM | POA: Diagnosis not present

## 2015-06-13 ENCOUNTER — Encounter (HOSPITAL_COMMUNITY): Payer: Self-pay

## 2015-06-13 ENCOUNTER — Ambulatory Visit (HOSPITAL_COMMUNITY): Payer: PPO | Admitting: Anesthesiology

## 2015-06-13 ENCOUNTER — Encounter (HOSPITAL_COMMUNITY)
Admission: RE | Disposition: A | Discharge status: Home / Self Care | Payer: Self-pay | Source: Ambulatory Visit | Attending: Gastroenterology

## 2015-06-13 ENCOUNTER — Ambulatory Visit (HOSPITAL_COMMUNITY)
Admission: RE | Admit: 2015-06-13 | Discharge: 2015-06-13 | Disposition: A | Discharge status: Home / Self Care | Payer: PPO | Source: Ambulatory Visit | Attending: Gastroenterology | Admitting: Gastroenterology

## 2015-06-13 DIAGNOSIS — Z1211 Encounter for screening for malignant neoplasm of colon: Secondary | ICD-10-CM | POA: Insufficient documentation

## 2015-06-13 DIAGNOSIS — Z86711 Personal history of pulmonary embolism: Secondary | ICD-10-CM | POA: Diagnosis not present

## 2015-06-13 DIAGNOSIS — M1990 Unspecified osteoarthritis, unspecified site: Secondary | ICD-10-CM | POA: Insufficient documentation

## 2015-06-13 DIAGNOSIS — I10 Essential (primary) hypertension: Secondary | ICD-10-CM | POA: Insufficient documentation

## 2015-06-13 DIAGNOSIS — Z7901 Long term (current) use of anticoagulants: Secondary | ICD-10-CM | POA: Diagnosis not present

## 2015-06-13 DIAGNOSIS — D124 Benign neoplasm of descending colon: Secondary | ICD-10-CM | POA: Diagnosis not present

## 2015-06-13 DIAGNOSIS — D123 Benign neoplasm of transverse colon: Secondary | ICD-10-CM | POA: Diagnosis not present

## 2015-06-13 DIAGNOSIS — D682 Hereditary deficiency of other clotting factors: Secondary | ICD-10-CM | POA: Insufficient documentation

## 2015-06-13 DIAGNOSIS — G4733 Obstructive sleep apnea (adult) (pediatric): Secondary | ICD-10-CM | POA: Diagnosis not present

## 2015-06-13 DIAGNOSIS — K635 Polyp of colon: Secondary | ICD-10-CM | POA: Diagnosis not present

## 2015-06-13 DIAGNOSIS — D125 Benign neoplasm of sigmoid colon: Secondary | ICD-10-CM | POA: Insufficient documentation

## 2015-06-13 DIAGNOSIS — Z86010 Personal history of colonic polyps: Secondary | ICD-10-CM | POA: Diagnosis not present

## 2015-06-13 DIAGNOSIS — Z6841 Body Mass Index (BMI) 40.0 and over, adult: Secondary | ICD-10-CM | POA: Diagnosis not present

## 2015-06-13 DIAGNOSIS — I482 Chronic atrial fibrillation: Secondary | ICD-10-CM | POA: Insufficient documentation

## 2015-06-13 DIAGNOSIS — J449 Chronic obstructive pulmonary disease, unspecified: Secondary | ICD-10-CM | POA: Insufficient documentation

## 2015-06-13 DIAGNOSIS — K219 Gastro-esophageal reflux disease without esophagitis: Secondary | ICD-10-CM | POA: Insufficient documentation

## 2015-06-13 DIAGNOSIS — Z9981 Dependence on supplemental oxygen: Secondary | ICD-10-CM | POA: Diagnosis not present

## 2015-06-13 HISTORY — PX: COLONOSCOPY WITH PROPOFOL: SHX5780

## 2015-06-13 SURGERY — COLONOSCOPY WITH PROPOFOL
Anesthesia: Monitor Anesthesia Care

## 2015-06-13 MED ORDER — LACTATED RINGERS IV SOLN
INTRAVENOUS | Status: DC
  Administered 2015-06-13 (×2): via INTRAVENOUS

## 2015-06-13 MED ORDER — PROPOFOL 500 MG/50ML IV EMUL
INTRAVENOUS | Status: DC | PRN
  Administered 2015-06-13: 100 ug/kg/min via INTRAVENOUS

## 2015-06-13 MED ORDER — PROPOFOL 10 MG/ML IV BOLUS
INTRAVENOUS | Status: AC
  Filled 2015-06-13: qty 40

## 2015-06-13 MED ORDER — SODIUM CHLORIDE 0.9 % IV SOLN: INTRAVENOUS | Status: DC

## 2015-06-13 MED ORDER — PROPOFOL 500 MG/50ML IV EMUL
INTRAVENOUS | Status: DC | PRN
  Administered 2015-06-13: 30 mg via INTRAVENOUS

## 2015-06-13 SURGICAL SUPPLY — 22 items

## 2015-06-13 NOTE — Anesthesia Preprocedure Evaluation (Addendum)
Anesthesia Evaluation  Patient identified by MRN, date of birth, ID band Patient awake    Reviewed: Allergy & Precautions, NPO status , Patient's Chart, lab work & pertinent test results  Airway Mallampati: II  TM Distance: >3 FB Neck ROM: Full    Dental no notable dental hx.    Pulmonary shortness of breath and Long-Term Oxygen Therapy, asthma , sleep apnea and Oxygen sleep apnea , COPD,  oxygen dependent, PE   Pulmonary exam normal breath sounds clear to auscultation       Cardiovascular hypertension, Pt. on medications Normal cardiovascular exam Rhythm:Regular Rate:Normal  ECHO 11-02-14: Left ventricle: The cavity size was normal. Wall thickness was  normal. Systolic function was normal. The estimated ejection  fraction was in the range of 55% to 60%. Wall motion was normal;  there were no regional wall motion abnormalities. - Left atrium: The atrium was mildly to moderately dilated. - Right atrium: The atrium was mildly dilated.   Neuro/Psych negative neurological ROS  negative psych ROS   GI/Hepatic Neg liver ROS, GERD  Medicated,  Endo/Other  Morbid obesity  Renal/GU negative Renal ROS  negative genitourinary   Musculoskeletal  (+) Arthritis ,   Abdominal (+) + obese,   Peds negative pediatric ROS (+)  Hematology negative hematology ROS (+)   Anesthesia Other Findings   Reproductive/Obstetrics negative OB ROS                            Anesthesia Physical Anesthesia Plan  ASA: IV  Anesthesia Plan: MAC   Post-op Pain Management:    Induction: Intravenous  Airway Management Planned: Natural Airway  Additional Equipment:   Intra-op Plan:   Post-operative Plan:   Informed Consent: I have reviewed the patients History and Physical, chart, labs and discussed the procedure including the risks, benefits and alternatives for the proposed anesthesia with the patient or  authorized representative who has indicated his/her understanding and acceptance.   Dental advisory given  Plan Discussed with: CRNA  Anesthesia Plan Comments:         Anesthesia Quick Evaluation

## 2015-06-13 NOTE — Discharge Instructions (Signed)

## 2015-06-13 NOTE — Op Note (Signed)
[facility] Patient Name: Rhonda Acevedo Procedure Date: 06/13/2015 MRN: [id number] Attending MD: Garlan Fair , MD Date of Birth: August 11, 1950 CSN: [id number] Age: 65 Admit Type: Outpatient Procedure:                Colonoscopy Indications:              High risk colon cancer surveillance: Personal                            history of adenoma with villous component Providers:                Garlan Fair, MD, Hilma Favors, RN, Zenon Mayo, RN, Cherylynn Ridges, Technician, Arnoldo Hooker, CRNA Referring MD:              Medicines:                Propofol per Anesthesia Complications:            No immediate complications. Estimated Blood Loss:     Estimated blood loss: none. Procedure:                Pre-Anesthesia Assessment:                           - Prior to the procedure, a History and Physical                            was performed, and patient medications and                            allergies were reviewed. The patient's tolerance of                            previous anesthesia was also reviewed. The risks                            and benefits of the procedure and the sedation                            options and risks were discussed with the patient.                            All questions were answered, and informed consent                            was obtained. Prior Anticoagulants: The patient has                            taken Coumadin (warfarin), last dose was 5 days                            prior to procedure. [ASA Grade]. After reviewing  the risks and benefits, the patient was deemed in                            satisfactory condition to undergo the procedure.                           - Prior to the procedure, a History and Physical                            was performed, and patient medications and                            allergies were reviewed. The patient's tolerance of                             previous anesthesia was also reviewed. The risks                            and benefits of the procedure and the sedation                            options and risks were discussed with the patient.                            All questions were answered, and informed consent                            was obtained. Prior Anticoagulants: The patient has                            taken Lovenox (enoxaparin), last dose was 1 day                            prior to procedure. ASA Grade Assessment: III - A                            patient with severe systemic disease. After                            reviewing the risks and benefits, the patient was                            deemed in satisfactory condition to undergo the                            procedure.                           After obtaining informed consent, the colonoscope                            was passed under direct vision. Throughout the  procedure, the patient's blood pressure, pulse, and                            oxygen saturations were monitored continuously. The                            Colonoscope was introduced through the anus and                            advanced to the the cecum, identified by                            appendiceal orifice and ileocecal valve. The                            colonoscopy was performed without difficulty. The                            patient tolerated the procedure well. The quality                            of the bowel preparation was good. The appendiceal                            orifice and the rectum were photographed. Scope In: 11:27:16 AM Scope Out: 11:54:27 AM Scope Withdrawal Time: 0 hours 18 minutes 2 seconds  Total Procedure Duration: 0 hours 27 minutes 11 seconds  Findings:      The perianal and digital rectal examinations were normal.      A 5 mm polyp was found in the mid sigmoid colon. The polyp was  sessile.       The polyp was removed with a cold snare. Resection and retrieval were       complete. An Endo Clip was applied to the polypectpmy site      A 5 mm polyp was found in the mid descending colon. The polyp was       sessile. The polyp was removed with a cold snare. Resection and       retrieval were complete. An Endo Clip was applied to the polypectomy site      A 3 mm polyp was found in the hepatic flexure. The polyp was sessile.       The polyp was removed with a cold biopsy forceps. Resection and       retrieval were complete.      The exam was otherwise without abnormality. Impression:               - One 5 mm polyp in the mid sigmoid colon, removed                            with a cold snare. Resected and retrieved.                           - One 5 mm polyp in the mid descending colon,  removed with a cold snare. Resected and retrieved.                           - One 3 mm polyp at the hepatic flexure, removed                            with a cold biopsy forceps. Resected and retrieved.                           - The examination was otherwise normal. Moderate Sedation:      N/A- Per Anesthesia Care Recommendation:           - Patient has a contact number available for                            emergencies. The signs and symptoms of potential                            delayed complications were discussed with the                            patient. Return to normal activities tomorrow.                            Written discharge instructions were provided to the                            patient.                           - Repeat colonoscopy in 5 years for surveillance.                           - Resume previous diet.                           - Continue present medications. Procedure Code(s):        --- Professional ---                           [phone], Colonoscopy, flexible; with removal of                            tumor(s),  polyp(s), or other lesion(s) by snare                            technique                           [id number], 51, Colonoscopy, flexible; with biopsy,                            single or multiple Diagnosis Code(s):        --- Professional ---  Z86.010, Personal history of colonic polyps                           D12.5, Benign neoplasm of sigmoid colon                           D12.4, Benign neoplasm of descending colon                           D12.3, Benign neoplasm of transverse colon (hepatic                            flexure or splenic flexure) CPT copyright 2016 American [employer] Association. All rights reserved. The codes documented in this report are preliminary and upon coder review may  be revised to meet current compliance requirements. Earle Gell, MD Garlan Fair, MD 06/13/2015 12:01:13 PM This report has been signed electronically. Number of Addenda: 0

## 2015-06-13 NOTE — Anesthesia Postprocedure Evaluation (Signed)
Anesthesia Post Note  Patient: Rhonda Acevedo  Procedure(s) Performed: Procedure(s) (LRB): COLONOSCOPY WITH PROPOFOL (N/A)  Patient location during evaluation: PACU Anesthesia Type: MAC Level of consciousness: awake and alert Pain management: pain level controlled Vital Signs Assessment: post-procedure vital signs reviewed and stable Respiratory status: spontaneous breathing, nonlabored ventilation, respiratory function stable and patient connected to nasal cannula oxygen Cardiovascular status: stable and blood pressure returned to baseline Anesthetic complications: no    Last Vitals:  Filed Vitals:   06/13/15 1215 06/13/15 1220  BP:  170/69  Pulse: 65 64  Temp:    Resp: 17 25    Last Pain:  Filed Vitals:   06/13/15 1223  PainSc: 5                  [name] J

## 2015-06-13 NOTE — Transfer of Care (Signed)
Immediate Anesthesia Transfer of Care Note  Patient: Rhonda Acevedo  Procedure(s) Performed: Procedure(s): COLONOSCOPY WITH PROPOFOL (N/A)  Patient Location: PACU  Anesthesia Type:MAC  Level of Consciousness:  sedated, patient cooperative and responds to stimulation  Airway & Oxygen Therapy:Patient Spontanous Breathing and Patient connected to face mask oxgen  Post-op Assessment:  Report given to PACU RN and Post -op Vital signs reviewed and stable  Post vital signs:  Reviewed and stable  Last Vitals:  Filed Vitals:   06/13/15 1036  BP: 162/71  Pulse: 71  Temp: 36.6 C  Resp: 19    Complications: No apparent anesthesia complications

## 2015-06-13 NOTE — H&P (Signed)
  Procedure: Surveillance colonoscopy. In 2003, the patient underwent her first screening colonoscopy with removal of a small tubulovillous adenomatous polyp from the sigmoid colon. In April 2006, she underwent a surveillance colonoscopy with removal of  two diminutive adenomatous colon polyps. In April 2011, she underwent a surveillance colonoscopy which was normal.  History: The patient is a 65 year old female born Aug 30, 1950. She is scheduled to undergo a surveillance colonoscopy today.  She chronically takes Coumadin to treat permanent atrial fibrillation and prevent recurrent pulmonary emboli. She has an IVC filter in place.  She stop taking Coumadin 5 days ago and has been on a Lovenox bridge. Her last dose of Lovenox was 24 hours ago.  Past medical history: Morbid obesity. Hypertension. Hypercholesterolemia. Multiple pulmonary emboli. Lifelong Coumadin anticoagulation. Abdominal ventral hernia repair. Allergic rhinitis. Asthma. Diastolic heart dysfunction. Osteoarthritis. Factor V deficiency. Obstructive sleep apnea syndrome. Left ventricular ejection fraction 57%. Normal coronary arteries by cardiac catheterization in 2015. Permanent atrial fibrillation.  Allergies: Benzapril cause cough  Exam: The patient is alert and lying comfortably on the endoscopy stretcher. Abdomen is soft and nontender to palpation. Lungs are clear to auscultation. Cardiac exam reveals a regular rhythm.  Plan: Proceed with surveillance colonoscopy

## 2015-06-14 ENCOUNTER — Encounter (HOSPITAL_COMMUNITY): Payer: Self-pay | Admitting: Gastroenterology

## 2015-06-23 DIAGNOSIS — R739 Hyperglycemia, unspecified: Secondary | ICD-10-CM | POA: Diagnosis not present

## 2015-06-23 DIAGNOSIS — Z7901 Long term (current) use of anticoagulants: Secondary | ICD-10-CM | POA: Diagnosis not present

## 2015-06-28 DIAGNOSIS — Z7901 Long term (current) use of anticoagulants: Secondary | ICD-10-CM | POA: Diagnosis not present

## 2015-07-02 DIAGNOSIS — I509 Heart failure, unspecified: Secondary | ICD-10-CM | POA: Diagnosis not present

## 2015-07-02 DIAGNOSIS — I2699 Other pulmonary embolism without acute cor pulmonale: Secondary | ICD-10-CM | POA: Diagnosis not present

## 2015-07-02 DIAGNOSIS — J45909 Unspecified asthma, uncomplicated: Secondary | ICD-10-CM | POA: Diagnosis not present

## 2015-07-02 DIAGNOSIS — I5032 Chronic diastolic (congestive) heart failure: Secondary | ICD-10-CM | POA: Diagnosis not present

## 2015-07-02 DIAGNOSIS — G4733 Obstructive sleep apnea (adult) (pediatric): Secondary | ICD-10-CM | POA: Diagnosis not present

## 2015-07-02 DIAGNOSIS — I10 Essential (primary) hypertension: Secondary | ICD-10-CM | POA: Diagnosis not present

## 2015-07-17 DIAGNOSIS — J984 Other disorders of lung: Secondary | ICD-10-CM | POA: Diagnosis not present

## 2015-07-17 DIAGNOSIS — Z1151 Encounter for screening for human papillomavirus (HPV): Secondary | ICD-10-CM | POA: Diagnosis not present

## 2015-07-17 DIAGNOSIS — I10 Essential (primary) hypertension: Secondary | ICD-10-CM | POA: Diagnosis not present

## 2015-07-17 DIAGNOSIS — E878 Other disorders of electrolyte and fluid balance, not elsewhere classified: Secondary | ICD-10-CM | POA: Diagnosis not present

## 2015-07-17 DIAGNOSIS — Z86718 Personal history of other venous thrombosis and embolism: Secondary | ICD-10-CM | POA: Diagnosis not present

## 2015-07-17 DIAGNOSIS — Z9981 Dependence on supplemental oxygen: Secondary | ICD-10-CM | POA: Diagnosis not present

## 2015-07-17 DIAGNOSIS — Z124 Encounter for screening for malignant neoplasm of cervix: Secondary | ICD-10-CM | POA: Diagnosis not present

## 2015-07-17 DIAGNOSIS — N3281 Overactive bladder: Secondary | ICD-10-CM | POA: Diagnosis not present

## 2015-07-17 DIAGNOSIS — Z7901 Long term (current) use of anticoagulants: Secondary | ICD-10-CM | POA: Diagnosis not present

## 2015-07-17 DIAGNOSIS — I4891 Unspecified atrial fibrillation: Secondary | ICD-10-CM | POA: Diagnosis not present

## 2015-07-17 DIAGNOSIS — R938 Abnormal findings on diagnostic imaging of other specified body structures: Secondary | ICD-10-CM | POA: Diagnosis not present

## 2015-07-17 DIAGNOSIS — Z86711 Personal history of pulmonary embolism: Secondary | ICD-10-CM | POA: Diagnosis not present

## 2015-07-18 DIAGNOSIS — R938 Abnormal findings on diagnostic imaging of other specified body structures: Secondary | ICD-10-CM | POA: Diagnosis not present

## 2015-08-01 DIAGNOSIS — I10 Essential (primary) hypertension: Secondary | ICD-10-CM | POA: Diagnosis not present

## 2015-08-01 DIAGNOSIS — I2699 Other pulmonary embolism without acute cor pulmonale: Secondary | ICD-10-CM | POA: Diagnosis not present

## 2015-08-01 DIAGNOSIS — I509 Heart failure, unspecified: Secondary | ICD-10-CM | POA: Diagnosis not present

## 2015-08-01 DIAGNOSIS — I5032 Chronic diastolic (congestive) heart failure: Secondary | ICD-10-CM | POA: Diagnosis not present

## 2015-08-01 DIAGNOSIS — G4733 Obstructive sleep apnea (adult) (pediatric): Secondary | ICD-10-CM | POA: Diagnosis not present

## 2015-08-01 DIAGNOSIS — J45909 Unspecified asthma, uncomplicated: Secondary | ICD-10-CM | POA: Diagnosis not present

## 2015-08-02 DIAGNOSIS — I2699 Other pulmonary embolism without acute cor pulmonale: Secondary | ICD-10-CM | POA: Diagnosis not present

## 2015-08-02 DIAGNOSIS — I5032 Chronic diastolic (congestive) heart failure: Secondary | ICD-10-CM | POA: Diagnosis not present

## 2015-08-02 DIAGNOSIS — G4733 Obstructive sleep apnea (adult) (pediatric): Secondary | ICD-10-CM | POA: Diagnosis not present

## 2015-08-02 DIAGNOSIS — Z7901 Long term (current) use of anticoagulants: Secondary | ICD-10-CM | POA: Diagnosis not present

## 2015-08-02 DIAGNOSIS — I4891 Unspecified atrial fibrillation: Secondary | ICD-10-CM | POA: Diagnosis not present

## 2015-08-02 DIAGNOSIS — J4540 Moderate persistent asthma, uncomplicated: Secondary | ICD-10-CM | POA: Diagnosis not present

## 2015-08-02 DIAGNOSIS — E7800 Pure hypercholesterolemia, unspecified: Secondary | ICD-10-CM | POA: Diagnosis not present

## 2015-08-02 DIAGNOSIS — Z6841 Body Mass Index (BMI) 40.0 and over, adult: Secondary | ICD-10-CM | POA: Diagnosis not present

## 2015-08-02 DIAGNOSIS — K219 Gastro-esophageal reflux disease without esophagitis: Secondary | ICD-10-CM | POA: Diagnosis not present

## 2015-08-02 DIAGNOSIS — I10 Essential (primary) hypertension: Secondary | ICD-10-CM | POA: Diagnosis not present

## 2015-08-15 DIAGNOSIS — Z803 Family history of malignant neoplasm of breast: Secondary | ICD-10-CM | POA: Diagnosis not present

## 2015-08-15 DIAGNOSIS — Z1231 Encounter for screening mammogram for malignant neoplasm of breast: Secondary | ICD-10-CM | POA: Diagnosis not present

## 2015-08-30 DIAGNOSIS — Z7901 Long term (current) use of anticoagulants: Secondary | ICD-10-CM | POA: Diagnosis not present

## 2015-09-01 DIAGNOSIS — I509 Heart failure, unspecified: Secondary | ICD-10-CM | POA: Diagnosis not present

## 2015-09-01 DIAGNOSIS — J45909 Unspecified asthma, uncomplicated: Secondary | ICD-10-CM | POA: Diagnosis not present

## 2015-09-01 DIAGNOSIS — I5032 Chronic diastolic (congestive) heart failure: Secondary | ICD-10-CM | POA: Diagnosis not present

## 2015-09-01 DIAGNOSIS — G4733 Obstructive sleep apnea (adult) (pediatric): Secondary | ICD-10-CM | POA: Diagnosis not present

## 2015-09-01 DIAGNOSIS — I10 Essential (primary) hypertension: Secondary | ICD-10-CM | POA: Diagnosis not present

## 2015-09-01 DIAGNOSIS — I2699 Other pulmonary embolism without acute cor pulmonale: Secondary | ICD-10-CM | POA: Diagnosis not present

## 2015-09-22 DIAGNOSIS — H524 Presbyopia: Secondary | ICD-10-CM | POA: Diagnosis not present

## 2015-09-22 DIAGNOSIS — H2513 Age-related nuclear cataract, bilateral: Secondary | ICD-10-CM | POA: Diagnosis not present

## 2015-09-28 DIAGNOSIS — Z7901 Long term (current) use of anticoagulants: Secondary | ICD-10-CM | POA: Diagnosis not present

## 2015-10-02 DIAGNOSIS — I2699 Other pulmonary embolism without acute cor pulmonale: Secondary | ICD-10-CM | POA: Diagnosis not present

## 2015-10-02 DIAGNOSIS — G4733 Obstructive sleep apnea (adult) (pediatric): Secondary | ICD-10-CM | POA: Diagnosis not present

## 2015-10-02 DIAGNOSIS — I10 Essential (primary) hypertension: Secondary | ICD-10-CM | POA: Diagnosis not present

## 2015-10-02 DIAGNOSIS — I5032 Chronic diastolic (congestive) heart failure: Secondary | ICD-10-CM | POA: Diagnosis not present

## 2015-10-02 DIAGNOSIS — J45909 Unspecified asthma, uncomplicated: Secondary | ICD-10-CM | POA: Diagnosis not present

## 2015-10-02 DIAGNOSIS — I509 Heart failure, unspecified: Secondary | ICD-10-CM | POA: Diagnosis not present

## 2015-10-10 DIAGNOSIS — H2512 Age-related nuclear cataract, left eye: Secondary | ICD-10-CM | POA: Diagnosis not present

## 2015-10-10 DIAGNOSIS — H25812 Combined forms of age-related cataract, left eye: Secondary | ICD-10-CM | POA: Diagnosis not present

## 2015-10-26 DIAGNOSIS — Z7901 Long term (current) use of anticoagulants: Secondary | ICD-10-CM | POA: Diagnosis not present

## 2015-11-01 DIAGNOSIS — I2699 Other pulmonary embolism without acute cor pulmonale: Secondary | ICD-10-CM | POA: Diagnosis not present

## 2015-11-01 DIAGNOSIS — G4733 Obstructive sleep apnea (adult) (pediatric): Secondary | ICD-10-CM | POA: Diagnosis not present

## 2015-11-01 DIAGNOSIS — I10 Essential (primary) hypertension: Secondary | ICD-10-CM | POA: Diagnosis not present

## 2015-11-01 DIAGNOSIS — I5032 Chronic diastolic (congestive) heart failure: Secondary | ICD-10-CM | POA: Diagnosis not present

## 2015-11-01 DIAGNOSIS — J45909 Unspecified asthma, uncomplicated: Secondary | ICD-10-CM | POA: Diagnosis not present

## 2015-11-01 DIAGNOSIS — I509 Heart failure, unspecified: Secondary | ICD-10-CM | POA: Diagnosis not present

## 2015-11-14 DIAGNOSIS — H2511 Age-related nuclear cataract, right eye: Secondary | ICD-10-CM | POA: Diagnosis not present

## 2015-11-14 DIAGNOSIS — H25811 Combined forms of age-related cataract, right eye: Secondary | ICD-10-CM | POA: Diagnosis not present

## 2015-11-22 DIAGNOSIS — Z7901 Long term (current) use of anticoagulants: Secondary | ICD-10-CM | POA: Diagnosis not present

## 2015-12-02 DIAGNOSIS — G4733 Obstructive sleep apnea (adult) (pediatric): Secondary | ICD-10-CM | POA: Diagnosis not present

## 2015-12-02 DIAGNOSIS — I509 Heart failure, unspecified: Secondary | ICD-10-CM | POA: Diagnosis not present

## 2015-12-02 DIAGNOSIS — J45909 Unspecified asthma, uncomplicated: Secondary | ICD-10-CM | POA: Diagnosis not present

## 2015-12-02 DIAGNOSIS — I10 Essential (primary) hypertension: Secondary | ICD-10-CM | POA: Diagnosis not present

## 2015-12-02 DIAGNOSIS — I5032 Chronic diastolic (congestive) heart failure: Secondary | ICD-10-CM | POA: Diagnosis not present

## 2015-12-02 DIAGNOSIS — I2699 Other pulmonary embolism without acute cor pulmonale: Secondary | ICD-10-CM | POA: Diagnosis not present

## 2015-12-06 DIAGNOSIS — Z6841 Body Mass Index (BMI) 40.0 and over, adult: Secondary | ICD-10-CM | POA: Diagnosis not present

## 2015-12-06 DIAGNOSIS — J4540 Moderate persistent asthma, uncomplicated: Secondary | ICD-10-CM | POA: Diagnosis not present

## 2015-12-06 DIAGNOSIS — I4891 Unspecified atrial fibrillation: Secondary | ICD-10-CM | POA: Diagnosis not present

## 2015-12-06 DIAGNOSIS — I2699 Other pulmonary embolism without acute cor pulmonale: Secondary | ICD-10-CM | POA: Diagnosis not present

## 2015-12-06 DIAGNOSIS — I5032 Chronic diastolic (congestive) heart failure: Secondary | ICD-10-CM | POA: Diagnosis not present

## 2015-12-06 DIAGNOSIS — G4733 Obstructive sleep apnea (adult) (pediatric): Secondary | ICD-10-CM | POA: Diagnosis not present

## 2015-12-06 DIAGNOSIS — I10 Essential (primary) hypertension: Secondary | ICD-10-CM | POA: Diagnosis not present

## 2015-12-06 DIAGNOSIS — Z23 Encounter for immunization: Secondary | ICD-10-CM | POA: Diagnosis not present

## 2015-12-10 ENCOUNTER — Other Ambulatory Visit: Payer: Self-pay | Admitting: Cardiology

## 2015-12-14 NOTE — Progress Notes (Signed)
Cardiology Office Note  Date: 12/19/2015   ID: Rhonda Acevedo, DOB Aug 17, 1950, MRN [id number]  PCP: Wenda Low, MD  Primary Cardiologist: Rozann Lesches, MD   Chief Complaint  Patient presents with  . Atrial Fibrillation    History of Present Illness: Rhonda Acevedo is a 65 y.o. female last seen in April.She presents for a routine follow-up visit. Reports only rare palpitations in the interim, no chest pain, dizziness, or syncope.  She remains on Coumadin with follow-up by PCP. She is following once a month, states her last INR was 2. I am requesting her last set of lab work from physical earlier this year.  I reviewed her medications. Cardiac regimen includes Coumadin, Cardizem CD, and flecainide.  Past Medical History:  Diagnosis Date  . Allergic rhinitis   . Esophageal reflux   . Essential hypertension, benign   . Factor V Leiden mutation ([place])   . GERD (gastroesophageal reflux disease)   . History of cardiac catheterization    Normal coronaries July 2015  . History of DVT (deep vein thrombosis)   . History of pulmonary embolism    Multiple, status post IVC filter, on Coumadin  . Mixed hyperlipidemia   . Obstructive sleep apnea    NPSG 05-29-07; AHI 32.9,cpap 7 to 9  . On home oxygen therapy    2 liters at night- usual- no recent changes  . Osteoarthritis   . Persistent atrial fibrillation [facility])    Documented October 2016  . Transfusion history    [facility]    Current Outpatient Prescriptions  Medication Sig Dispense Refill  . Acetaminophen (TYLENOL ARTHRITIS PAIN PO) Take 2 tablets by mouth every 8 (eight) hours as needed (For pain.).     Marland Kitchen albuterol (ACCUNEB) 1.25 MG/3ML nebulizer solution Take 1 ampule by nebulization every 6 (six) hours as needed for wheezing or shortness of breath.     Marland Kitchen albuterol (PROAIR HFA) 108 (90 BASE) MCG/ACT inhaler Inhale 2 puffs into the lungs 4 (four) times daily as needed for  wheezing or shortness of breath.     . Calcium Carbonate-Vitamin D (CALCIUM-VITAMIN D) 600-200 MG-UNIT CAPS Take 1 capsule by mouth daily.     . cetirizine (ZYRTEC) 10 MG tablet Take 10 mg by mouth daily.     . Cholecalciferol (VITAMIN D3) 2000 UNITS TABS Take 2,000 Units by mouth daily.     Marland Kitchen diltiazem (CARDIZEM CD) 300 MG 24 hr capsule Take 300 mg by mouth daily.    . famotidine (PEPCID) 20 MG tablet Take 40 mg by mouth 2 (two) times daily.     . flecainide (TAMBOCOR) 50 MG tablet TAKE ONE TABLET BY MOUTH TWICE DAILY BEGIN IN ONE WEEK 12/12/2015 180 tablet 3  . fluticasone (FLONASE) 50 MCG/ACT nasal spray Place 1 spray into the nose 2 (two) times daily.     . Fluticasone-Salmeterol (ADVAIR) 500-50 MCG/DOSE AEPB Inhale 1 puff into the lungs every 12 (twelve) hours.    . furosemide (LASIX) 20 MG tablet Take 20 mg by mouth 2 (two) times daily.     Marland Kitchen losartan (COZAAR) 100 MG tablet Take 50 mg by mouth at bedtime.     Marland Kitchen oxybutynin (DITROPAN) 5 MG tablet Take 5 mg by mouth 2 (two) times daily.    . polyethylene glycol powder (GLYCOLAX/MIRALAX) powder Take 17 g by mouth at bedtime.     . pravastatin (PRAVACHOL) 80 MG tablet Take 80 mg by mouth at bedtime.     Marland Kitchen  PULMICORT FLEXHALER 180 MCG/ACT inhaler INHALE TWO PUFFS BY MOUTH THEN RINSE MOUTH TWICE DAILY -MAINTENANCE 1 Inhaler 5  . vitamin C (ASCORBIC ACID) 500 MG tablet Take 500 mg by mouth daily.     Marland Kitchen warfarin (COUMADIN) 10 MG tablet Take 10 mg by mouth See admin instructions. Take one tablet daily except none of Friday.     No current facility-administered medications for this visit.    Allergies:  Adhesive [tape] and Benazepril hcl   Social History: The patient  reports that she has never smoked. She has never used smokeless tobacco. She reports that she does not drink alcohol or use drugs.   ROS:  Please see the history of present illness. Otherwise, complete review of systems is positive for chronic shortness of breath with exertion, uses  supplemental oxygen.  All other systems are reviewed and negative.   Physical Exam: VS:  BP 130/76   Pulse 73   Ht 5\' 7"  (1.702 m)   Wt (!) 358 lb (162.4 kg)   SpO2 96%   BMI 56.07 kg/m , BMI Body mass index is 56.07 kg/m.  Wt Readings from Last 3 Encounters:  12/19/15 (!) 358 lb (162.4 kg)  06/13/15 (!) 394 lb (178.7 kg)  05/08/15 (!) 394 lb (178.7 kg)    Morbidly obese woman, appears comfortable at rest. HEENT: Conjunctiva and lids normal, oropharynx clear. Neck: Supple, no elevated JVP or carotid bruits, no thyromegaly. Lungs: Decreased breath sounds without wheezing, nonlabored breathing at rest. Cardiac: Distant, regular rate and rhythm, no S3, soft systolic murmur, no pericardial rub. Abdomen: Morbidly obese with pannus, nontender, bowel sounds present, no guarding or rebound. Extremities: Chronic appearing edema is mild with associated adipose tissue, distal pulses 2+.  ECG: I personally reviewed the tracing from 7/70/2017 which shows sinus rhythm with low voltage, poor R-wave progression, nonspecific T-wave changes.  Other Studies Reviewed Today:  Echocardiogram 11/02/2014: Study Conclusions  - Left ventricle: The cavity size was normal. Wall thickness was normal. Systolic function was normal. The estimated ejection fraction was in the range of 55% to 60%. Wall motion was normal; there were no regional wall motion abnormalities. - Left atrium: The atrium was mildly to moderately dilated. - Right atrium: The atrium was mildly dilated.  Assessment and Plan:  1. Paroxysmal atrial fibrillation, symptomatically well controlled on current regimen including Cardizem CD, flecainide, and Coumadin.  2. Hypercoagulable state with factor V Leiden mutation and history of recurrent DVT and pulmonary emboli. She continues on long-term Coumadin.  3. Essential hypertension, blood pressure is adequately controlled today.  Current medicines were reviewed with the patient  today.  Disposition: Follow-up in 6 months.  Signed, Satira Sark, MD, [facility] 12/19/2015 1:51 PM    [place] [facility] [address], [place], [place] [address] Phone: [phone]; Fax: [phone]

## 2015-12-19 ENCOUNTER — Ambulatory Visit (INDEPENDENT_AMBULATORY_CARE_PROVIDER_SITE_OTHER): Payer: PPO | Admitting: Cardiology

## 2015-12-19 ENCOUNTER — Encounter: Payer: Self-pay | Admitting: Cardiology

## 2015-12-19 VITALS — BP 130/76 | HR 73 | Ht 67.0 in | Wt 358.0 lb

## 2015-12-19 DIAGNOSIS — I10 Essential (primary) hypertension: Secondary | ICD-10-CM | POA: Diagnosis not present

## 2015-12-19 DIAGNOSIS — D6859 Other primary thrombophilia: Secondary | ICD-10-CM | POA: Diagnosis not present

## 2015-12-19 DIAGNOSIS — I480 Paroxysmal atrial fibrillation: Secondary | ICD-10-CM | POA: Diagnosis not present

## 2015-12-19 NOTE — Patient Instructions (Signed)
Medication Instructions:  Your physician recommends that you continue on your current medications as directed. Please refer to the Current Medication list given to you today.   Labwork: I will request a copy of most recent labs from PCP  Testing/Procedures: NONE  Follow-Up: Your physician wants you to follow-up in: 6 MONTHS .  You will receive a reminder letter in the mail two months in advance. If you don't receive a letter, please call our office to schedule the follow-up appointment.   Any Other Special Instructions Will Be Listed Below (If Applicable).     If you need a refill on your cardiac medications before your next appointment, please call your pharmacy.

## 2015-12-20 DIAGNOSIS — Z7901 Long term (current) use of anticoagulants: Secondary | ICD-10-CM | POA: Diagnosis not present

## 2015-12-21 DIAGNOSIS — Z961 Presence of intraocular lens: Secondary | ICD-10-CM | POA: Diagnosis not present

## 2016-01-01 DIAGNOSIS — I2699 Other pulmonary embolism without acute cor pulmonale: Secondary | ICD-10-CM | POA: Diagnosis not present

## 2016-01-01 DIAGNOSIS — I10 Essential (primary) hypertension: Secondary | ICD-10-CM | POA: Diagnosis not present

## 2016-01-01 DIAGNOSIS — I5032 Chronic diastolic (congestive) heart failure: Secondary | ICD-10-CM | POA: Diagnosis not present

## 2016-01-01 DIAGNOSIS — I509 Heart failure, unspecified: Secondary | ICD-10-CM | POA: Diagnosis not present

## 2016-01-01 DIAGNOSIS — G4733 Obstructive sleep apnea (adult) (pediatric): Secondary | ICD-10-CM | POA: Diagnosis not present

## 2016-01-01 DIAGNOSIS — J45909 Unspecified asthma, uncomplicated: Secondary | ICD-10-CM | POA: Diagnosis not present

## 2016-01-17 DIAGNOSIS — Z7901 Long term (current) use of anticoagulants: Secondary | ICD-10-CM | POA: Diagnosis not present

## 2016-02-01 DIAGNOSIS — G4733 Obstructive sleep apnea (adult) (pediatric): Secondary | ICD-10-CM | POA: Diagnosis not present

## 2016-02-01 DIAGNOSIS — I2699 Other pulmonary embolism without acute cor pulmonale: Secondary | ICD-10-CM | POA: Diagnosis not present

## 2016-02-01 DIAGNOSIS — J45909 Unspecified asthma, uncomplicated: Secondary | ICD-10-CM | POA: Diagnosis not present

## 2016-02-01 DIAGNOSIS — I5032 Chronic diastolic (congestive) heart failure: Secondary | ICD-10-CM | POA: Diagnosis not present

## 2016-02-01 DIAGNOSIS — I10 Essential (primary) hypertension: Secondary | ICD-10-CM | POA: Diagnosis not present

## 2016-02-01 DIAGNOSIS — I509 Heart failure, unspecified: Secondary | ICD-10-CM | POA: Diagnosis not present

## 2016-02-15 ENCOUNTER — Ambulatory Visit (INDEPENDENT_AMBULATORY_CARE_PROVIDER_SITE_OTHER): Payer: PPO | Admitting: Internal Medicine

## 2016-02-15 ENCOUNTER — Encounter: Payer: Self-pay | Admitting: Internal Medicine

## 2016-02-15 VITALS — BP 124/68 | HR 66 | Ht 66.0 in | Wt 360.6 lb

## 2016-02-15 DIAGNOSIS — G4733 Obstructive sleep apnea (adult) (pediatric): Secondary | ICD-10-CM

## 2016-02-15 DIAGNOSIS — E662 Morbid (severe) obesity with alveolar hypoventilation: Secondary | ICD-10-CM | POA: Diagnosis not present

## 2016-02-15 DIAGNOSIS — J9611 Chronic respiratory failure with hypoxia: Secondary | ICD-10-CM

## 2016-02-15 DIAGNOSIS — Z7901 Long term (current) use of anticoagulants: Secondary | ICD-10-CM | POA: Diagnosis not present

## 2016-02-15 DIAGNOSIS — I2782 Chronic pulmonary embolism: Secondary | ICD-10-CM

## 2016-02-15 NOTE — Progress Notes (Signed)
HPI F followed for OSA, OHS, morbid obesity, complicated by hx DVT/ PE/warfarin, asthma/ bronchitis, GERD PFT 04/12/2011-normal spirometry flows within significant response to bronchodilator. FEV1/FVC 0.79. Mild restriction and moderate diffusion reduction both may be do to her obesity. PFT: 02/10/2014-obstructive and restrictive changes and reduction of diffusion best explained by her obesity/hypoventilation syndrome ----------------------------------------------------------------------  02/13/2015-66 year old female never smoker followed for OSA, OHS, morbid obesity, complicated by history DVT/PE, asthma/bronchitis, GERD, PAFib CPAP auto 4-9 and oxygen 3 L sleep/ [facility] FOLLOWS FOR: Wears CPAP every night for about 8+ hours; DME: [facility] Download confirms 100% compliance with excellent control. On flecanide for atrial fib. Says she continues to need both Advair and Pulmicort to control asthma. If she comes off of either for 3 days she begins increased wheeze and chest tightness.   02/15/2016-66 year old female never smoker followed for OSA, OHS, morbid obesity, complicated by history DVT/PE/warfarin, asthma/bronchitis, GERD, P A. fib CPAP auto 4-9 and oxygen 3 L sleep and portable/ [facility] She is doing very well and very comfortable with her CPAP/oxygen used all night every night. Definitely sleeps better and feels better with it. Has avoided acute respiratory infections so far this winter with no routine cough or wheeze.  ROS-see HPI  + = pos Constitutional:   No-   weight loss, night sweats, fevers, chills, fatigue, lassitude. HEENT:   No-  headaches, difficulty swallowing, tooth/dental problems, sore throat,       No-  sneezing, itching, ear ache, +nasal congestion, post nasal drip,  CV:  No-   chest pain, orthopnea, PND, swelling in lower extremities, anasarca,  dizziness, palpitations Resp: + shortness of breath with exertion or at rest.              No-   productive cough,   non-productive cough,  No- coughing up of blood.              No-   change in color of mucus.  No- wheezing.   Skin: No-   rash or lesions. GI:  No-   heartburn, indigestion, abdominal pain, nausea, vomiting,  GU:  MS:  No-   joint pain or swelling.   Neuro-     nothing unusual Psych:  No- change in mood or affect. No depression or anxiety.  No memory loss.  OBJ- Physical Exam General- Alert, Oriented, Affect-appropriate, Distress- none acute. +Morbid obesity.  Skin- rash-none, lesions- none, excoriation- none Lymphadenopathy- none Head- atraumatic            Eyes- Gross vision intact, PERRLA, conjunctivae and secretions clear            Ears- Hearing, canals-normal            Nose- Clear, no-Septal dev, mucus, polyps, erosion, perforation             Throat- Mallampati II-III , mucosa clear , drainage- none, tonsils- atrophic. No-hoarseness, throat clearing Neck- flexible , trachea midline, no stridor , thyroid nl, carotid no bruit Chest - symmetrical excursion , unlabored           Heart/CV- RRR , no murmur , no gallop  , no rub, nl s1 s2                           - JVD- none , edema-+1, stasis changes+chronic, varices- none           Lung- clear to P&A, wheeze- none, cough- none , dullness-none, rub- none. Shallow c/w habitus  Chest wall-  Abd- Br/ Gen/ Rectal- Not done, not indicated Extrem- cyanosis- none, clubbing, none, atrophy- none, strength- nl Neuro- grossly intact to observation

## 2016-02-15 NOTE — Patient Instructions (Signed)
Order- DME needs clarification. O2 to continue at 3L   She was getting this through [facility]                                                      CPAP (also [facility]? Was [employer])  Continue CPAP auto 4-9, mask of choice, supplies, humidifier   Dx OSA, OHS              Needs replacement mask and supplies please    Please call us as needed

## 2016-02-17 NOTE — Assessment & Plan Note (Signed)
She describes good compliance and control with no CPAP changes required. We will update supplies and request download.

## 2016-02-17 NOTE — Assessment & Plan Note (Signed)
She continues oxygen with sleep and portable. 3 L remains appropriate. She will continue to need this and does well with it.

## 2016-02-17 NOTE — Assessment & Plan Note (Signed)
No recurrence. She continues chronic maintenance anticoagulation with warfarin. Her body build alone puts her at increased long-term risk, justifying continued anticoagulation.

## 2016-02-28 ENCOUNTER — Encounter: Payer: Self-pay | Admitting: Internal Medicine

## 2016-02-29 DIAGNOSIS — I10 Essential (primary) hypertension: Secondary | ICD-10-CM | POA: Diagnosis not present

## 2016-02-29 DIAGNOSIS — J45909 Unspecified asthma, uncomplicated: Secondary | ICD-10-CM | POA: Diagnosis not present

## 2016-02-29 DIAGNOSIS — G4733 Obstructive sleep apnea (adult) (pediatric): Secondary | ICD-10-CM | POA: Diagnosis not present

## 2016-02-29 DIAGNOSIS — I509 Heart failure, unspecified: Secondary | ICD-10-CM | POA: Diagnosis not present

## 2016-02-29 DIAGNOSIS — I5032 Chronic diastolic (congestive) heart failure: Secondary | ICD-10-CM | POA: Diagnosis not present

## 2016-02-29 DIAGNOSIS — I2699 Other pulmonary embolism without acute cor pulmonale: Secondary | ICD-10-CM | POA: Diagnosis not present

## 2016-03-03 DIAGNOSIS — I2699 Other pulmonary embolism without acute cor pulmonale: Secondary | ICD-10-CM | POA: Diagnosis not present

## 2016-03-03 DIAGNOSIS — J45909 Unspecified asthma, uncomplicated: Secondary | ICD-10-CM | POA: Diagnosis not present

## 2016-03-03 DIAGNOSIS — I10 Essential (primary) hypertension: Secondary | ICD-10-CM | POA: Diagnosis not present

## 2016-03-03 DIAGNOSIS — I5032 Chronic diastolic (congestive) heart failure: Secondary | ICD-10-CM | POA: Diagnosis not present

## 2016-03-03 DIAGNOSIS — I509 Heart failure, unspecified: Secondary | ICD-10-CM | POA: Diagnosis not present

## 2016-03-03 DIAGNOSIS — G4733 Obstructive sleep apnea (adult) (pediatric): Secondary | ICD-10-CM | POA: Diagnosis not present

## 2016-03-14 DIAGNOSIS — Z7901 Long term (current) use of anticoagulants: Secondary | ICD-10-CM | POA: Diagnosis not present

## 2016-03-27 DIAGNOSIS — D649 Anemia, unspecified: Secondary | ICD-10-CM | POA: Diagnosis not present

## 2016-03-27 DIAGNOSIS — Z6841 Body Mass Index (BMI) 40.0 and over, adult: Secondary | ICD-10-CM | POA: Diagnosis not present

## 2016-03-27 DIAGNOSIS — R7303 Prediabetes: Secondary | ICD-10-CM | POA: Diagnosis not present

## 2016-03-27 DIAGNOSIS — I10 Essential (primary) hypertension: Secondary | ICD-10-CM | POA: Diagnosis not present

## 2016-03-27 DIAGNOSIS — G4733 Obstructive sleep apnea (adult) (pediatric): Secondary | ICD-10-CM | POA: Diagnosis not present

## 2016-03-27 DIAGNOSIS — Z1389 Encounter for screening for other disorder: Secondary | ICD-10-CM | POA: Diagnosis not present

## 2016-03-27 DIAGNOSIS — E7800 Pure hypercholesterolemia, unspecified: Secondary | ICD-10-CM | POA: Diagnosis not present

## 2016-03-27 DIAGNOSIS — J4540 Moderate persistent asthma, uncomplicated: Secondary | ICD-10-CM | POA: Diagnosis not present

## 2016-03-27 DIAGNOSIS — I2699 Other pulmonary embolism without acute cor pulmonale: Secondary | ICD-10-CM | POA: Diagnosis not present

## 2016-03-27 DIAGNOSIS — I4891 Unspecified atrial fibrillation: Secondary | ICD-10-CM | POA: Diagnosis not present

## 2016-03-27 DIAGNOSIS — Z0000 Encounter for general adult medical examination without abnormal findings: Secondary | ICD-10-CM | POA: Diagnosis not present

## 2016-03-27 DIAGNOSIS — I5032 Chronic diastolic (congestive) heart failure: Secondary | ICD-10-CM | POA: Diagnosis not present

## 2016-03-31 DIAGNOSIS — I509 Heart failure, unspecified: Secondary | ICD-10-CM | POA: Diagnosis not present

## 2016-03-31 DIAGNOSIS — I10 Essential (primary) hypertension: Secondary | ICD-10-CM | POA: Diagnosis not present

## 2016-03-31 DIAGNOSIS — I2699 Other pulmonary embolism without acute cor pulmonale: Secondary | ICD-10-CM | POA: Diagnosis not present

## 2016-03-31 DIAGNOSIS — J45909 Unspecified asthma, uncomplicated: Secondary | ICD-10-CM | POA: Diagnosis not present

## 2016-03-31 DIAGNOSIS — G4733 Obstructive sleep apnea (adult) (pediatric): Secondary | ICD-10-CM | POA: Diagnosis not present

## 2016-03-31 DIAGNOSIS — I5032 Chronic diastolic (congestive) heart failure: Secondary | ICD-10-CM | POA: Diagnosis not present

## 2016-04-11 DIAGNOSIS — Z7901 Long term (current) use of anticoagulants: Secondary | ICD-10-CM | POA: Diagnosis not present

## 2016-05-01 DIAGNOSIS — G4733 Obstructive sleep apnea (adult) (pediatric): Secondary | ICD-10-CM | POA: Diagnosis not present

## 2016-05-01 DIAGNOSIS — I509 Heart failure, unspecified: Secondary | ICD-10-CM | POA: Diagnosis not present

## 2016-05-01 DIAGNOSIS — I10 Essential (primary) hypertension: Secondary | ICD-10-CM | POA: Diagnosis not present

## 2016-05-01 DIAGNOSIS — I2699 Other pulmonary embolism without acute cor pulmonale: Secondary | ICD-10-CM | POA: Diagnosis not present

## 2016-05-01 DIAGNOSIS — I5032 Chronic diastolic (congestive) heart failure: Secondary | ICD-10-CM | POA: Diagnosis not present

## 2016-05-01 DIAGNOSIS — J45909 Unspecified asthma, uncomplicated: Secondary | ICD-10-CM | POA: Diagnosis not present

## 2016-05-09 DIAGNOSIS — Z7901 Long term (current) use of anticoagulants: Secondary | ICD-10-CM | POA: Diagnosis not present

## 2016-05-31 DIAGNOSIS — I509 Heart failure, unspecified: Secondary | ICD-10-CM | POA: Diagnosis not present

## 2016-05-31 DIAGNOSIS — I2699 Other pulmonary embolism without acute cor pulmonale: Secondary | ICD-10-CM | POA: Diagnosis not present

## 2016-05-31 DIAGNOSIS — I5032 Chronic diastolic (congestive) heart failure: Secondary | ICD-10-CM | POA: Diagnosis not present

## 2016-05-31 DIAGNOSIS — I10 Essential (primary) hypertension: Secondary | ICD-10-CM | POA: Diagnosis not present

## 2016-05-31 DIAGNOSIS — H43812 Vitreous degeneration, left eye: Secondary | ICD-10-CM | POA: Diagnosis not present

## 2016-05-31 DIAGNOSIS — J45909 Unspecified asthma, uncomplicated: Secondary | ICD-10-CM | POA: Diagnosis not present

## 2016-05-31 DIAGNOSIS — G4733 Obstructive sleep apnea (adult) (pediatric): Secondary | ICD-10-CM | POA: Diagnosis not present

## 2016-06-06 DIAGNOSIS — Z7901 Long term (current) use of anticoagulants: Secondary | ICD-10-CM | POA: Diagnosis not present

## 2016-07-01 DIAGNOSIS — J45909 Unspecified asthma, uncomplicated: Secondary | ICD-10-CM | POA: Diagnosis not present

## 2016-07-01 DIAGNOSIS — H43812 Vitreous degeneration, left eye: Secondary | ICD-10-CM | POA: Diagnosis not present

## 2016-07-01 DIAGNOSIS — I5032 Chronic diastolic (congestive) heart failure: Secondary | ICD-10-CM | POA: Diagnosis not present

## 2016-07-01 DIAGNOSIS — I509 Heart failure, unspecified: Secondary | ICD-10-CM | POA: Diagnosis not present

## 2016-07-01 DIAGNOSIS — I2699 Other pulmonary embolism without acute cor pulmonale: Secondary | ICD-10-CM | POA: Diagnosis not present

## 2016-07-01 DIAGNOSIS — G4733 Obstructive sleep apnea (adult) (pediatric): Secondary | ICD-10-CM | POA: Diagnosis not present

## 2016-07-01 DIAGNOSIS — I10 Essential (primary) hypertension: Secondary | ICD-10-CM | POA: Diagnosis not present

## 2016-07-04 DIAGNOSIS — Z7901 Long term (current) use of anticoagulants: Secondary | ICD-10-CM | POA: Diagnosis not present

## 2016-07-17 ENCOUNTER — Other Ambulatory Visit: Payer: Self-pay | Admitting: Internal Medicine

## 2016-07-18 DIAGNOSIS — Z7901 Long term (current) use of anticoagulants: Secondary | ICD-10-CM | POA: Diagnosis not present

## 2016-07-30 DIAGNOSIS — G4733 Obstructive sleep apnea (adult) (pediatric): Secondary | ICD-10-CM | POA: Diagnosis not present

## 2016-07-30 DIAGNOSIS — I10 Essential (primary) hypertension: Secondary | ICD-10-CM | POA: Diagnosis not present

## 2016-07-30 DIAGNOSIS — I4891 Unspecified atrial fibrillation: Secondary | ICD-10-CM | POA: Diagnosis not present

## 2016-07-30 DIAGNOSIS — I2699 Other pulmonary embolism without acute cor pulmonale: Secondary | ICD-10-CM | POA: Diagnosis not present

## 2016-07-30 DIAGNOSIS — I5032 Chronic diastolic (congestive) heart failure: Secondary | ICD-10-CM | POA: Diagnosis not present

## 2016-07-30 DIAGNOSIS — J4540 Moderate persistent asthma, uncomplicated: Secondary | ICD-10-CM | POA: Diagnosis not present

## 2016-07-30 DIAGNOSIS — E7800 Pure hypercholesterolemia, unspecified: Secondary | ICD-10-CM | POA: Diagnosis not present

## 2016-07-30 DIAGNOSIS — D510 Vitamin B12 deficiency anemia due to intrinsic factor deficiency: Secondary | ICD-10-CM | POA: Diagnosis not present

## 2016-07-30 DIAGNOSIS — Z6841 Body Mass Index (BMI) 40.0 and over, adult: Secondary | ICD-10-CM | POA: Diagnosis not present

## 2016-07-31 DIAGNOSIS — I509 Heart failure, unspecified: Secondary | ICD-10-CM | POA: Diagnosis not present

## 2016-07-31 DIAGNOSIS — J45909 Unspecified asthma, uncomplicated: Secondary | ICD-10-CM | POA: Diagnosis not present

## 2016-07-31 DIAGNOSIS — I5032 Chronic diastolic (congestive) heart failure: Secondary | ICD-10-CM | POA: Diagnosis not present

## 2016-07-31 DIAGNOSIS — G4733 Obstructive sleep apnea (adult) (pediatric): Secondary | ICD-10-CM | POA: Diagnosis not present

## 2016-07-31 DIAGNOSIS — I2699 Other pulmonary embolism without acute cor pulmonale: Secondary | ICD-10-CM | POA: Diagnosis not present

## 2016-07-31 DIAGNOSIS — I10 Essential (primary) hypertension: Secondary | ICD-10-CM | POA: Diagnosis not present

## 2016-08-01 ENCOUNTER — Ambulatory Visit (INDEPENDENT_AMBULATORY_CARE_PROVIDER_SITE_OTHER): Payer: PPO | Admitting: Cardiology

## 2016-08-01 ENCOUNTER — Encounter: Payer: Self-pay | Admitting: Cardiology

## 2016-08-01 VITALS — BP 130/84 | HR 68 | Ht 67.0 in | Wt 368.0 lb

## 2016-08-01 DIAGNOSIS — D6859 Other primary thrombophilia: Secondary | ICD-10-CM

## 2016-08-01 DIAGNOSIS — I480 Paroxysmal atrial fibrillation: Secondary | ICD-10-CM | POA: Diagnosis not present

## 2016-08-01 NOTE — Progress Notes (Signed)
Cardiology Office Note  Date: 08/01/2016   ID: Rhonda Acevedo, DOB 1950-03-26, MRN [id number]  PCP: Rhonda Low, MD  Primary Cardiologist: Rhonda Lesches, MD   Chief Complaint  Patient presents with  . Atrial Fibrillation    History of Present Illness: Rhonda Acevedo is a 66 y.o. female last seen in November 2017. She presents for a routine follow-up visit. From a cardiac perspective she does not report any palpitations or increasing shortness of breath. She reports compliance with her medications  She continues on Coumadin with follow-up per PCP. No reported bleeding episodes. She also remains on Cardizem CD and flecainide.  I personally reviewed her ECG today which shows sinus rhythm with Acevedo voltage in the precordial leads, poor R-wave progression, nonspecific T-wave changes.  Past Medical History:  Diagnosis Date  . Allergic rhinitis   . Esophageal reflux   . Essential hypertension, benign   . Factor V Leiden mutation ([place])   . GERD (gastroesophageal reflux disease)   . History of cardiac catheterization    Normal coronaries July 2015  . History of DVT (deep vein thrombosis)   . History of pulmonary embolism    Multiple, status post IVC filter, on Coumadin  . Mixed hyperlipidemia   . Obstructive sleep apnea    NPSG 05-29-07; AHI 32.9,cpap 7 to 9  . On home oxygen therapy    2 liters at night- usual- no recent changes  . Osteoarthritis   . Persistent atrial fibrillation [facility])    Documented October 2016  . Transfusion history    [facility]    Past Surgical History:  Procedure Laterality Date  . CHOLECYSTECTOMY    . COLONOSCOPY WITH PROPOFOL N/A 06/13/2015   Procedure: COLONOSCOPY WITH PROPOFOL;  Surgeon: Garlan Fair, MD;  Location: [facility];  Service: Endoscopy;  Laterality: N/A;  . ENDOMETRIAL ABLATION    . GANGLION CYST EXCISION    . Hernia abscess    . HERNIA REPAIR     umbilical  . I&D hernia  incisional abscess    . IVC filter Right   . LEFT HEART CATHETERIZATION WITH CORONARY ANGIOGRAM N/A 08/20/2013   Procedure: LEFT HEART CATHETERIZATION WITH CORONARY ANGIOGRAM;  Surgeon: Blane Ohara, MD;  Location: [facility] C[facility] LAB;  Service: Cardiovascular;  Laterality: N/A;  . ROTATOR CUFF REPAIR      Current Outpatient Prescriptions  Medication Sig Dispense Refill  . Acetaminophen (TYLENOL ARTHRITIS PAIN PO) Take 2 tablets by mouth every 8 (eight) hours as needed (For pain.).     Marland Kitchen albuterol (ACCUNEB) 1.25 MG/3ML nebulizer solution Take 1 ampule by nebulization every 6 (six) hours as needed for wheezing or shortness of breath.     Marland Kitchen albuterol (PROAIR HFA) 108 (90 BASE) MCG/ACT inhaler Inhale 2 puffs into the lungs 4 (four) times daily as needed for wheezing or shortness of breath.     . cetirizine (ZYRTEC) 10 MG tablet Take 10 mg by mouth daily.     Marland Kitchen diltiazem (CARDIZEM CD) 300 MG 24 hr capsule Take 300 mg by mouth daily.    . famotidine (PEPCID) 20 MG tablet Take 40 mg by mouth 2 (two) times daily.     . flecainide (TAMBOCOR) 50 MG tablet TAKE ONE TABLET BY MOUTH TWICE DAILY BEGIN IN ONE WEEK 12/12/2015 180 tablet 3  . fluticasone (FLONASE) 50 MCG/ACT nasal spray Place 1 spray into the nose 2 (two) times daily.     . Fluticasone-Salmeterol (ADVAIR)  500-50 MCG/DOSE AEPB Inhale 1 puff into the lungs every 12 (twelve) hours.    . furosemide (LASIX) 20 MG tablet Take 20 mg by mouth 2 (two) times daily.     Marland Kitchen losartan (COZAAR) 50 MG tablet Take 1 tablet by mouth daily.    Marland Kitchen oxybutynin (DITROPAN) 5 MG tablet Take 5 mg by mouth 2 (two) times daily.    . polyethylene glycol powder (GLYCOLAX/MIRALAX) powder Take 17 g by mouth at bedtime.     . pravastatin (PRAVACHOL) 80 MG tablet Take 80 mg by mouth at bedtime.     Marland Kitchen warfarin (COUMADIN) 10 MG tablet Take 10 mg by mouth See admin instructions. Take one tablet daily except none of Friday.     No current facility-administered medications for this  visit.    Allergies:  Adhesive [tape] and Benazepril hcl   Social History: The patient  reports that she has never smoked. She has never used smokeless tobacco. She reports that she does not drink alcohol or use drugs.   ROS:  Please see the history of present illness. Otherwise, complete review of systems is positive for intermittent lower leg swelling.  All other systems are reviewed and negative.   Physical Exam: VS:  BP 130/84   Pulse 68   Ht 5\' 7"  (1.702 m)   Wt (!) 368 lb (166.9 kg)   SpO2 97%   BMI 57.64 kg/m , BMI Body mass index is 57.64 kg/m.  Wt Readings from Last 3 Encounters:  08/01/16 (!) 368 lb (166.9 kg)  02/15/16 (!) 360 lb 9.6 oz (163.6 kg)  12/19/15 (!) 358 lb (162.4 kg)    Morbidly obese woman, appears comfortable at rest. HEENT: Conjunctiva and lids normal, oropharynx clear. Neck: Supple, no elevated JVP or carotid bruits, no thyromegaly. Lungs: Decreased breath sounds without wheezing, nonlabored breathing at rest. Cardiac: Distant, regular rate and rhythm, no S3, soft systolic murmur, no pericardial rub. Abdomen: Morbidly obese with pannus, nontender, bowel sounds present, no guarding or rebound. Extremities: Chronic appearing edema is mild with associated adipose tissue, distal pulses 2+.  ECG: I personally reviewed the tracing from 7/70/2017 which shows sinus rhythm with Acevedo voltage, poor R-wave progression, nonspecific T-wave changes.  Recent Labwork:  November 2017: INR 3.0  Other Studies Reviewed Today:  Echocardiogram 11/02/2014: Study Conclusions  - Left ventricle: The cavity size was normal. Wall thickness was normal. Systolic function was normal. The estimated ejection fraction was in the range of 55% to 60%. Wall motion was normal; there were no regional wall motion abnormalities. - Left atrium: The atrium was mildly to moderately dilated. - Right atrium: The atrium was mildly dilated.  Assessment and Plan:  1. Paroxysmal  atrial fibrillation, continues to do well on combination of flecainide and Cardizem CD. She is in sinus rhythm today. Continue Coumadin with follow-up per PCP.  2. 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 She is on long-term Coumadin.  Current medicines were reviewed with the patient today.   Orders Placed This Encounter  Procedures  . EKG 12-Lead    Disposition: Follow-up in 6 months.  Signed, Satira Sark, MD, [facility] 08/01/2016 2:10 PM    [place] [facility] [address], [place], [place] [address] Phone: [phone]; Fax: [phone]

## 2016-08-01 NOTE — Patient Instructions (Signed)
Your physician wants you to follow-up in:  6 months with Dr McDowell You will receive a reminder letter in the mail two months in advance. If you don't receive a letter, please call our office to schedule the follow-up appointment.    Your physician recommends that you continue on your current medications as directed. Please refer to the Current Medication list given to you today.     If you need a refill on your cardiac medications before your next appointment, please call your pharmacy.     No lab work or testing ordered today.      Thank you for choosing [facility] !        

## 2016-08-15 DIAGNOSIS — Z7901 Long term (current) use of anticoagulants: Secondary | ICD-10-CM | POA: Diagnosis not present

## 2016-08-29 DIAGNOSIS — I5032 Chronic diastolic (congestive) heart failure: Secondary | ICD-10-CM | POA: Diagnosis not present

## 2016-08-29 DIAGNOSIS — I10 Essential (primary) hypertension: Secondary | ICD-10-CM | POA: Diagnosis not present

## 2016-08-29 DIAGNOSIS — G4733 Obstructive sleep apnea (adult) (pediatric): Secondary | ICD-10-CM | POA: Diagnosis not present

## 2016-08-29 DIAGNOSIS — I2699 Other pulmonary embolism without acute cor pulmonale: Secondary | ICD-10-CM | POA: Diagnosis not present

## 2016-08-29 DIAGNOSIS — I509 Heart failure, unspecified: Secondary | ICD-10-CM | POA: Diagnosis not present

## 2016-08-29 DIAGNOSIS — J45909 Unspecified asthma, uncomplicated: Secondary | ICD-10-CM | POA: Diagnosis not present

## 2016-08-31 DIAGNOSIS — J45909 Unspecified asthma, uncomplicated: Secondary | ICD-10-CM | POA: Diagnosis not present

## 2016-08-31 DIAGNOSIS — I509 Heart failure, unspecified: Secondary | ICD-10-CM | POA: Diagnosis not present

## 2016-08-31 DIAGNOSIS — G4733 Obstructive sleep apnea (adult) (pediatric): Secondary | ICD-10-CM | POA: Diagnosis not present

## 2016-08-31 DIAGNOSIS — I5032 Chronic diastolic (congestive) heart failure: Secondary | ICD-10-CM | POA: Diagnosis not present

## 2016-08-31 DIAGNOSIS — I10 Essential (primary) hypertension: Secondary | ICD-10-CM | POA: Diagnosis not present

## 2016-08-31 DIAGNOSIS — I2699 Other pulmonary embolism without acute cor pulmonale: Secondary | ICD-10-CM | POA: Diagnosis not present

## 2016-09-04 DIAGNOSIS — H43811 Vitreous degeneration, right eye: Secondary | ICD-10-CM | POA: Diagnosis not present

## 2016-09-05 ENCOUNTER — Other Ambulatory Visit: Payer: Self-pay | Admitting: Pharmacy Technician

## 2016-09-05 ENCOUNTER — Encounter: Payer: Self-pay | Admitting: Pharmacy Technician

## 2016-09-05 NOTE — Patient Outreach (Signed)
[facility]) Care Management  09/05/2016  JAILEN LUNG 1951-01-16 [id number]  I contacted patient's pharmacy in reference to medication adherence for [employer]. The medication in question is Pravastatin 80 mg which the patient did not refill between January and April. The patient has been getting her refills monthly since April and I verified with the pharmacy that she has refilled it this month as well. I do not need to contact the patient at this time but I will continue to follow her.  Doreene Burke, [place] [phone]

## 2016-09-12 DIAGNOSIS — Z1231 Encounter for screening mammogram for malignant neoplasm of breast: Secondary | ICD-10-CM | POA: Diagnosis not present

## 2016-09-12 DIAGNOSIS — Z7901 Long term (current) use of anticoagulants: Secondary | ICD-10-CM | POA: Diagnosis not present

## 2016-09-12 DIAGNOSIS — Z803 Family history of malignant neoplasm of breast: Secondary | ICD-10-CM | POA: Diagnosis not present

## 2016-10-01 DIAGNOSIS — I5032 Chronic diastolic (congestive) heart failure: Secondary | ICD-10-CM | POA: Diagnosis not present

## 2016-10-01 DIAGNOSIS — I2699 Other pulmonary embolism without acute cor pulmonale: Secondary | ICD-10-CM | POA: Diagnosis not present

## 2016-10-01 DIAGNOSIS — J45909 Unspecified asthma, uncomplicated: Secondary | ICD-10-CM | POA: Diagnosis not present

## 2016-10-01 DIAGNOSIS — I10 Essential (primary) hypertension: Secondary | ICD-10-CM | POA: Diagnosis not present

## 2016-10-01 DIAGNOSIS — I509 Heart failure, unspecified: Secondary | ICD-10-CM | POA: Diagnosis not present

## 2016-10-01 DIAGNOSIS — G4733 Obstructive sleep apnea (adult) (pediatric): Secondary | ICD-10-CM | POA: Diagnosis not present

## 2016-10-07 DIAGNOSIS — H43811 Vitreous degeneration, right eye: Secondary | ICD-10-CM | POA: Diagnosis not present

## 2016-10-10 DIAGNOSIS — Z7901 Long term (current) use of anticoagulants: Secondary | ICD-10-CM | POA: Diagnosis not present

## 2016-11-03 ENCOUNTER — Other Ambulatory Visit: Payer: Self-pay | Admitting: Cardiology

## 2016-11-11 DIAGNOSIS — Z7901 Long term (current) use of anticoagulants: Secondary | ICD-10-CM | POA: Diagnosis not present

## 2016-12-01 DIAGNOSIS — J45909 Unspecified asthma, uncomplicated: Secondary | ICD-10-CM | POA: Diagnosis not present

## 2016-12-01 DIAGNOSIS — G4733 Obstructive sleep apnea (adult) (pediatric): Secondary | ICD-10-CM | POA: Diagnosis not present

## 2016-12-01 DIAGNOSIS — I2699 Other pulmonary embolism without acute cor pulmonale: Secondary | ICD-10-CM | POA: Diagnosis not present

## 2016-12-01 DIAGNOSIS — I10 Essential (primary) hypertension: Secondary | ICD-10-CM | POA: Diagnosis not present

## 2016-12-01 DIAGNOSIS — I509 Heart failure, unspecified: Secondary | ICD-10-CM | POA: Diagnosis not present

## 2016-12-01 DIAGNOSIS — I5032 Chronic diastolic (congestive) heart failure: Secondary | ICD-10-CM | POA: Diagnosis not present

## 2016-12-03 DIAGNOSIS — Z23 Encounter for immunization: Secondary | ICD-10-CM | POA: Diagnosis not present

## 2016-12-03 DIAGNOSIS — G4733 Obstructive sleep apnea (adult) (pediatric): Secondary | ICD-10-CM | POA: Diagnosis not present

## 2016-12-03 DIAGNOSIS — Z7901 Long term (current) use of anticoagulants: Secondary | ICD-10-CM | POA: Diagnosis not present

## 2016-12-03 DIAGNOSIS — I2699 Other pulmonary embolism without acute cor pulmonale: Secondary | ICD-10-CM | POA: Diagnosis not present

## 2016-12-03 DIAGNOSIS — I10 Essential (primary) hypertension: Secondary | ICD-10-CM | POA: Diagnosis not present

## 2016-12-03 DIAGNOSIS — I4891 Unspecified atrial fibrillation: Secondary | ICD-10-CM | POA: Diagnosis not present

## 2016-12-03 DIAGNOSIS — R7303 Prediabetes: Secondary | ICD-10-CM | POA: Diagnosis not present

## 2016-12-03 DIAGNOSIS — E7800 Pure hypercholesterolemia, unspecified: Secondary | ICD-10-CM | POA: Diagnosis not present

## 2016-12-03 DIAGNOSIS — M1990 Unspecified osteoarthritis, unspecified site: Secondary | ICD-10-CM | POA: Diagnosis not present

## 2016-12-19 DIAGNOSIS — H5203 Hypermetropia, bilateral: Secondary | ICD-10-CM | POA: Diagnosis not present

## 2016-12-19 DIAGNOSIS — Z961 Presence of intraocular lens: Secondary | ICD-10-CM | POA: Diagnosis not present

## 2016-12-19 DIAGNOSIS — H26491 Other secondary cataract, right eye: Secondary | ICD-10-CM | POA: Diagnosis not present

## 2016-12-31 DIAGNOSIS — I5032 Chronic diastolic (congestive) heart failure: Secondary | ICD-10-CM | POA: Diagnosis not present

## 2016-12-31 DIAGNOSIS — I509 Heart failure, unspecified: Secondary | ICD-10-CM | POA: Diagnosis not present

## 2016-12-31 DIAGNOSIS — I2699 Other pulmonary embolism without acute cor pulmonale: Secondary | ICD-10-CM | POA: Diagnosis not present

## 2016-12-31 DIAGNOSIS — I10 Essential (primary) hypertension: Secondary | ICD-10-CM | POA: Diagnosis not present

## 2016-12-31 DIAGNOSIS — G4733 Obstructive sleep apnea (adult) (pediatric): Secondary | ICD-10-CM | POA: Diagnosis not present

## 2016-12-31 DIAGNOSIS — J45909 Unspecified asthma, uncomplicated: Secondary | ICD-10-CM | POA: Diagnosis not present

## 2017-01-07 DIAGNOSIS — Z7901 Long term (current) use of anticoagulants: Secondary | ICD-10-CM | POA: Diagnosis not present

## 2017-01-31 DIAGNOSIS — J45909 Unspecified asthma, uncomplicated: Secondary | ICD-10-CM | POA: Diagnosis not present

## 2017-01-31 DIAGNOSIS — I5032 Chronic diastolic (congestive) heart failure: Secondary | ICD-10-CM | POA: Diagnosis not present

## 2017-01-31 DIAGNOSIS — I509 Heart failure, unspecified: Secondary | ICD-10-CM | POA: Diagnosis not present

## 2017-01-31 DIAGNOSIS — G4733 Obstructive sleep apnea (adult) (pediatric): Secondary | ICD-10-CM | POA: Diagnosis not present

## 2017-01-31 DIAGNOSIS — I10 Essential (primary) hypertension: Secondary | ICD-10-CM | POA: Diagnosis not present

## 2017-01-31 DIAGNOSIS — I2699 Other pulmonary embolism without acute cor pulmonale: Secondary | ICD-10-CM | POA: Diagnosis not present

## 2017-02-03 ENCOUNTER — Other Ambulatory Visit: Payer: Self-pay | Admitting: Cardiology

## 2017-02-04 DIAGNOSIS — Z7901 Long term (current) use of anticoagulants: Secondary | ICD-10-CM | POA: Diagnosis not present

## 2017-02-24 DIAGNOSIS — Z7901 Long term (current) use of anticoagulants: Secondary | ICD-10-CM | POA: Diagnosis not present

## 2017-03-03 DIAGNOSIS — J45909 Unspecified asthma, uncomplicated: Secondary | ICD-10-CM | POA: Diagnosis not present

## 2017-03-03 DIAGNOSIS — I5032 Chronic diastolic (congestive) heart failure: Secondary | ICD-10-CM | POA: Diagnosis not present

## 2017-03-03 DIAGNOSIS — G4733 Obstructive sleep apnea (adult) (pediatric): Secondary | ICD-10-CM | POA: Diagnosis not present

## 2017-03-03 DIAGNOSIS — I509 Heart failure, unspecified: Secondary | ICD-10-CM | POA: Diagnosis not present

## 2017-03-03 DIAGNOSIS — I10 Essential (primary) hypertension: Secondary | ICD-10-CM | POA: Diagnosis not present

## 2017-03-03 DIAGNOSIS — I2699 Other pulmonary embolism without acute cor pulmonale: Secondary | ICD-10-CM | POA: Diagnosis not present

## 2017-03-04 DIAGNOSIS — Z7901 Long term (current) use of anticoagulants: Secondary | ICD-10-CM | POA: Diagnosis not present

## 2017-03-17 ENCOUNTER — Encounter: Payer: Self-pay | Admitting: Internal Medicine

## 2017-03-17 ENCOUNTER — Ambulatory Visit: Payer: PPO | Admitting: Internal Medicine

## 2017-03-17 ENCOUNTER — Ambulatory Visit (INDEPENDENT_AMBULATORY_CARE_PROVIDER_SITE_OTHER)
Admission: RE | Admit: 2017-03-17 | Discharge: 2017-03-17 | Disposition: A | Discharge status: Home / Self Care | Payer: PPO | Source: Ambulatory Visit | Attending: Internal Medicine | Admitting: Internal Medicine

## 2017-03-17 VITALS — BP 110/78 | HR 54 | Ht 66.0 in | Wt 370.6 lb

## 2017-03-17 DIAGNOSIS — G4733 Obstructive sleep apnea (adult) (pediatric): Secondary | ICD-10-CM | POA: Diagnosis not present

## 2017-03-17 DIAGNOSIS — J208 Acute bronchitis due to other specified organisms: Secondary | ICD-10-CM

## 2017-03-17 DIAGNOSIS — R079 Chest pain, unspecified: Secondary | ICD-10-CM | POA: Diagnosis not present

## 2017-03-17 DIAGNOSIS — J9611 Chronic respiratory failure with hypoxia: Secondary | ICD-10-CM

## 2017-03-17 DIAGNOSIS — R6889 Other general symptoms and signs: Secondary | ICD-10-CM | POA: Diagnosis not present

## 2017-03-17 DIAGNOSIS — E662 Morbid (severe) obesity with alveolar hypoventilation: Secondary | ICD-10-CM | POA: Diagnosis not present

## 2017-03-17 DIAGNOSIS — J209 Acute bronchitis, unspecified: Secondary | ICD-10-CM

## 2017-03-17 DIAGNOSIS — J4540 Moderate persistent asthma, uncomplicated: Secondary | ICD-10-CM | POA: Diagnosis not present

## 2017-03-17 DIAGNOSIS — R05 Cough: Secondary | ICD-10-CM | POA: Diagnosis not present

## 2017-03-17 DIAGNOSIS — R0602 Shortness of breath: Secondary | ICD-10-CM | POA: Diagnosis not present

## 2017-03-17 LAB — POCT INFLUENZA A/B
INFLUENZA A, POC: POSITIVE — AB
INFLUENZA B, POC: NEGATIVE

## 2017-03-17 MED ORDER — PREDNISONE 10 MG PO TABS
ORAL_TABLET | ORAL | 0 refills | Status: DC
Start: 2017-03-17 — End: 2017-05-22

## 2017-03-17 MED ORDER — AZITHROMYCIN 250 MG PO TABS: ORAL_TABLET | ORAL | 0 refills | Status: DC

## 2017-03-17 NOTE — Progress Notes (Signed)
HPI F followed for OSA, OHS, morbid obesity, complicated by hx DVT/ PE/warfarin, asthma/ bronchitis, GERD PFT 04/12/2011-normal spirometry flows within significant response to bronchodilator. FEV1/FVC 0.79. Mild restriction and moderate diffusion reduction both may be do to her obesity. PFT: 02/10/2014-obstructive and restrictive changes and reduction of diffusion best explained by her obesity/hypoventilation syndrome ---------------------------------------------------------------------- 02/15/2016-67 year old female never smoker followed for OSA, OHS, morbid obesity, complicated by history DVT/PE/warfarin, asthma/bronchitis, GERD, P A. fib CPAP auto 4-9 and oxygen 3 L sleep and portable/ [facility] She is doing very well and very comfortable with her CPAP/oxygen used all night every night. Definitely sleeps better and feels better with it. Has avoided acute respiratory infections so far this winter with no routine cough or wheeze.  03/17/17- 67 year old female never smoker followed for OSA, OHS, morbid obesity, complicated by history DVT/PE/warfarin, asthma/bronchitis, GERD, P A. fib CPAP auto 5-12 and oxygen 3 L sleep and portable/ [facility] ----OSA: DME: [facility]. Pt wears CPAP nightly and DL attached. No new supplies needed for CPAP but patient would like order for new nebulizer supplies sent.  Body weight today 370 lbs Respiratory infection for the past week with low-grade fever, yellow sputum head and chest congestion.  Family member had flu. CPAP download 100% compliance, AHI 2.0/hour.  She depends on CPAP, cannot sleep without it.  Continues to use oxygen 24/7. Rapid flu test 03/17/17-Positive Influenza A  ROS-see HPI  + = pos Constitutional:   No-   weight loss, night sweats, +fevers, chills, fatigue, lassitude. HEENT:   No-  headaches, difficulty swallowing, tooth/dental problems, sore throat,       No-  sneezing, itching, ear ache, +nasal congestion, post nasal drip,  CV:  No-   chest pain,  orthopnea, PND, swelling in lower extremities, anasarca,  dizziness, palpitations Resp: + shortness of breath with exertion or at rest.              +  productive cough,  non-productive cough,  No- coughing up of blood.              + change in color of mucus.  No- wheezing.   Skin: No-   rash or lesions. GI:  No-   heartburn, indigestion, abdominal pain, nausea, vomiting,  GU:  MS:  No-   joint pain or swelling.   Neuro-     nothing unusual Psych:  No- change in mood or affect. No depression or anxiety.  No memory loss.  OBJ- Physical Exam General- Alert, Oriented, Affect-appropriate, Distress- none acute. +Morbid obesity.  Skin- rash-none, lesions- none, excoriation- none Lymphadenopathy- none Head- atraumatic            Eyes- Gross vision intact, PERRLA, conjunctivae and secretions clear            Ears- Hearing, canals-normal            Nose- Clear, no-Septal dev, mucus, polyps, erosion, perforation             Throat- Mallampati II-III , mucosa clear , drainage- none, tonsils- atrophic. No-hoarseness, throat clearing Neck- flexible , trachea midline, no stridor , thyroid nl, carotid no bruit Chest - symmetrical excursion , unlabored           Heart/CV- RRR , no murmur , no gallop  , no rub, nl s1 s2                           - JVD- none , edema-+1, stasis changes+chronic,  varices- none           Lung- clear to P&A, wheeze- none, cough + raspy, dullness-none, rub- none. Shallow c/w habitus           Chest wall-  Abd- Br/ Gen/ Rectal- Not done, not indicated Extrem- cyanosis- none, clubbing, none, atrophy- none, strength- nl Neuro- grossly intact to observation

## 2017-03-17 NOTE — Patient Instructions (Addendum)
Order- CXR  Dx Acute bronchitis  Order- rapid flu test  Order- DME [facility]   Please replace nebulizer supplies- tubing, filter, etc   Dx asthmatic bronchitis moderate persistent  Order- DME [facility]  Please replace old CPAP machine, auto 4-12, mask of choice, humidifier, supplies, AirView    Dx OSA    No break in therapy  Scripts sent for Zpak and prednisone taper  Tell your coumadin clinic that you were on this antibiotic next visit.

## 2017-03-18 ENCOUNTER — Encounter: Payer: Self-pay | Admitting: Internal Medicine

## 2017-03-18 DIAGNOSIS — I5032 Chronic diastolic (congestive) heart failure: Secondary | ICD-10-CM | POA: Diagnosis not present

## 2017-03-18 DIAGNOSIS — G4733 Obstructive sleep apnea (adult) (pediatric): Secondary | ICD-10-CM | POA: Diagnosis not present

## 2017-03-18 DIAGNOSIS — I509 Heart failure, unspecified: Secondary | ICD-10-CM | POA: Diagnosis not present

## 2017-03-18 DIAGNOSIS — I2699 Other pulmonary embolism without acute cor pulmonale: Secondary | ICD-10-CM | POA: Diagnosis not present

## 2017-03-18 DIAGNOSIS — J45909 Unspecified asthma, uncomplicated: Secondary | ICD-10-CM | POA: Diagnosis not present

## 2017-03-18 DIAGNOSIS — I10 Essential (primary) hypertension: Secondary | ICD-10-CM | POA: Diagnosis not present

## 2017-03-19 NOTE — Telephone Encounter (Signed)
Chest Xray showed changes OF COPD, no worse. Her Pulmonary Function Test in 2016 showed only mild airflow slowing from COPD.

## 2017-03-19 NOTE — Telephone Encounter (Signed)
Dr. Young please advise.  Thanks! 

## 2017-03-20 NOTE — Assessment & Plan Note (Signed)
Remains dependent on oxygen.  Usage is appropriate.

## 2017-03-20 NOTE — Assessment & Plan Note (Signed)
Body weight is not improved.  She has not been willing to make lifestyle changes necessary to accomplish this.

## 2017-03-20 NOTE — Assessment & Plan Note (Signed)
Influenza A positive.  Too late for Tamiflu. Plan-Z-Pak, CXR, maintain hydration.  Comfort meds as needed.

## 2017-03-31 DIAGNOSIS — I5032 Chronic diastolic (congestive) heart failure: Secondary | ICD-10-CM | POA: Diagnosis not present

## 2017-03-31 DIAGNOSIS — Z6841 Body Mass Index (BMI) 40.0 and over, adult: Secondary | ICD-10-CM | POA: Diagnosis not present

## 2017-03-31 DIAGNOSIS — R739 Hyperglycemia, unspecified: Secondary | ICD-10-CM | POA: Diagnosis not present

## 2017-03-31 DIAGNOSIS — Z1389 Encounter for screening for other disorder: Secondary | ICD-10-CM | POA: Diagnosis not present

## 2017-03-31 DIAGNOSIS — E7800 Pure hypercholesterolemia, unspecified: Secondary | ICD-10-CM | POA: Diagnosis not present

## 2017-03-31 DIAGNOSIS — I509 Heart failure, unspecified: Secondary | ICD-10-CM | POA: Diagnosis not present

## 2017-03-31 DIAGNOSIS — J45909 Unspecified asthma, uncomplicated: Secondary | ICD-10-CM | POA: Diagnosis not present

## 2017-03-31 DIAGNOSIS — D649 Anemia, unspecified: Secondary | ICD-10-CM | POA: Diagnosis not present

## 2017-03-31 DIAGNOSIS — Z7901 Long term (current) use of anticoagulants: Secondary | ICD-10-CM | POA: Diagnosis not present

## 2017-03-31 DIAGNOSIS — I4891 Unspecified atrial fibrillation: Secondary | ICD-10-CM | POA: Diagnosis not present

## 2017-03-31 DIAGNOSIS — J4540 Moderate persistent asthma, uncomplicated: Secondary | ICD-10-CM | POA: Diagnosis not present

## 2017-03-31 DIAGNOSIS — G4733 Obstructive sleep apnea (adult) (pediatric): Secondary | ICD-10-CM | POA: Diagnosis not present

## 2017-03-31 DIAGNOSIS — I2699 Other pulmonary embolism without acute cor pulmonale: Secondary | ICD-10-CM | POA: Diagnosis not present

## 2017-03-31 DIAGNOSIS — I10 Essential (primary) hypertension: Secondary | ICD-10-CM | POA: Diagnosis not present

## 2017-03-31 DIAGNOSIS — Z0000 Encounter for general adult medical examination without abnormal findings: Secondary | ICD-10-CM | POA: Diagnosis not present

## 2017-04-07 DIAGNOSIS — I10 Essential (primary) hypertension: Secondary | ICD-10-CM | POA: Diagnosis not present

## 2017-04-07 DIAGNOSIS — I5032 Chronic diastolic (congestive) heart failure: Secondary | ICD-10-CM | POA: Diagnosis not present

## 2017-04-07 DIAGNOSIS — Z7901 Long term (current) use of anticoagulants: Secondary | ICD-10-CM | POA: Diagnosis not present

## 2017-04-07 DIAGNOSIS — G4733 Obstructive sleep apnea (adult) (pediatric): Secondary | ICD-10-CM | POA: Diagnosis not present

## 2017-04-07 DIAGNOSIS — J45909 Unspecified asthma, uncomplicated: Secondary | ICD-10-CM | POA: Diagnosis not present

## 2017-04-07 DIAGNOSIS — I509 Heart failure, unspecified: Secondary | ICD-10-CM | POA: Diagnosis not present

## 2017-04-07 DIAGNOSIS — I2699 Other pulmonary embolism without acute cor pulmonale: Secondary | ICD-10-CM | POA: Diagnosis not present

## 2017-04-09 ENCOUNTER — Telehealth: Payer: Self-pay | Admitting: Internal Medicine

## 2017-04-09 NOTE — Telephone Encounter (Signed)
lmtcb x1 for pt. 

## 2017-04-10 NOTE — Telephone Encounter (Signed)
Called and spoke with pt. Pt is requesting apt for cpap compliance prior to 06/21/17 per [facility]. First available with CY is 07/18/17.   CY please advise. Thanks  Current Outpatient Medications on File Prior to Visit  Medication Sig Dispense Refill  . Acetaminophen (TYLENOL ARTHRITIS PAIN PO) Take 2 tablets by mouth every 8 (eight) hours as needed (For pain.).     Marland Kitchen albuterol (ACCUNEB) 1.25 MG/3ML nebulizer solution Take 1 ampule by nebulization every 6 (six) hours as needed for wheezing or shortness of breath.     Marland Kitchen albuterol (PROAIR HFA) 108 (90 BASE) MCG/ACT inhaler Inhale 2 puffs into the lungs 4 (four) times daily as needed for wheezing or shortness of breath.     Marland Kitchen azithromycin (ZITHROMAX) 250 MG tablet 2 today then one daily 6 tablet 0  . cetirizine (ZYRTEC) 10 MG tablet Take 10 mg by mouth daily.     Marland Kitchen diltiazem (CARDIZEM CD) 300 MG 24 hr capsule Take 300 mg by mouth daily.    . famotidine (PEPCID) 20 MG tablet Take 40 mg by mouth 2 (two) times daily.     . flecainide (TAMBOCOR) 50 MG tablet TAKE 1 TABLET BY MOUTH TWICE DAILY 180 tablet 1  . Fluticasone-Salmeterol (ADVAIR) 500-50 MCG/DOSE AEPB Inhale 1 puff into the lungs every 12 (twelve) hours.    . furosemide (LASIX) 20 MG tablet Take 20 mg by mouth 2 (two) times daily.     Marland Kitchen losartan (COZAAR) 50 MG tablet Take 1 tablet by mouth daily.    Marland Kitchen oxybutynin (DITROPAN) 5 MG tablet Take 5 mg by mouth 2 (two) times daily.    . polyethylene glycol powder (GLYCOLAX/MIRALAX) powder Take 17 g by mouth at bedtime.     . pravastatin (PRAVACHOL) 80 MG tablet Take 80 mg by mouth at bedtime.     . predniSONE (DELTASONE) 10 MG tablet 4 X 2 DAYS, 3 X 2 DAYS, 2 X 2 DAYS, 1 X 2 DAYS 20 tablet 0  . warfarin (COUMADIN) 10 MG tablet Take 10 mg by mouth See admin instructions. Take one tablet daily except none of Friday.     No current facility-administered medications on file prior to visit.     Allergies  Allergen Reactions  . Adhesive [Tape] Itching and  Rash  . Benazepril Hcl Cough

## 2017-04-10 NOTE — Telephone Encounter (Signed)
Pt can be seen 4.22.19 @ 11:30am, 4.26.19 @ 11:30am, 5.2.19 @ 11:30am, or 5.3.19 @11 :30am. Thanks.

## 2017-04-10 NOTE — Telephone Encounter (Signed)
Spoke with the pt and have scheduled appt with CDY for 4/22 at 11:30 am

## 2017-05-06 ENCOUNTER — Encounter: Payer: Self-pay | Admitting: Internal Medicine

## 2017-05-06 DIAGNOSIS — Z7901 Long term (current) use of anticoagulants: Secondary | ICD-10-CM | POA: Diagnosis not present

## 2017-05-08 DIAGNOSIS — I2699 Other pulmonary embolism without acute cor pulmonale: Secondary | ICD-10-CM | POA: Diagnosis not present

## 2017-05-08 DIAGNOSIS — G4733 Obstructive sleep apnea (adult) (pediatric): Secondary | ICD-10-CM | POA: Diagnosis not present

## 2017-05-08 DIAGNOSIS — I509 Heart failure, unspecified: Secondary | ICD-10-CM | POA: Diagnosis not present

## 2017-05-08 DIAGNOSIS — I10 Essential (primary) hypertension: Secondary | ICD-10-CM | POA: Diagnosis not present

## 2017-05-08 DIAGNOSIS — J45909 Unspecified asthma, uncomplicated: Secondary | ICD-10-CM | POA: Diagnosis not present

## 2017-05-08 DIAGNOSIS — I5032 Chronic diastolic (congestive) heart failure: Secondary | ICD-10-CM | POA: Diagnosis not present

## 2017-05-12 ENCOUNTER — Ambulatory Visit: Payer: PPO | Admitting: Internal Medicine

## 2017-05-21 ENCOUNTER — Encounter: Payer: Self-pay | Admitting: Internal Medicine

## 2017-05-22 ENCOUNTER — Encounter: Payer: Self-pay | Admitting: Internal Medicine

## 2017-05-22 ENCOUNTER — Ambulatory Visit: Payer: PPO | Admitting: Internal Medicine

## 2017-05-22 DIAGNOSIS — Z7901 Long term (current) use of anticoagulants: Secondary | ICD-10-CM | POA: Diagnosis not present

## 2017-05-22 DIAGNOSIS — G4733 Obstructive sleep apnea (adult) (pediatric): Secondary | ICD-10-CM | POA: Diagnosis not present

## 2017-05-22 DIAGNOSIS — J9611 Chronic respiratory failure with hypoxia: Secondary | ICD-10-CM

## 2017-05-22 NOTE — Patient Instructions (Signed)
We can continue CPAP auto 5-12 with O2 3L, mask of choice, humidifier, supplies, AirView   Ok to continue present meds   Please call if we can help

## 2017-05-22 NOTE — Progress Notes (Signed)
HPI F followed for OSA, OHS, morbid obesity, complicated by hx DVT/ PE/warfarin, asthma/ bronchitis, GERD PFT 04/12/2011-normal spirometry flows within significant response to bronchodilator. FEV1/FVC 0.79. Mild restriction and moderate diffusion reduction both may be do to her obesity. PFT: 02/10/2014-obstructive and restrictive changes and reduction of diffusion best explained by her obesity/hypoventilation syndrome ---------------------------------------------------------------------- 03/17/17- 67 year old female never smoker followed for OSA, OHS, morbid obesity, complicated by history DVT/PE/warfarin, asthma/bronchitis, GERD, P A. fib CPAP auto 5-12 and oxygen 3 L sleep and portable/ [facility] ----OSA: DME: [facility]. Pt wears CPAP nightly and DL attached. No new supplies needed for CPAP but patient would like order for new nebulizer supplies sent.  Body weight today 370 lbs Respiratory infection for the past week with low-grade fever, yellow sputum head and chest congestion.  Family member had flu. CPAP download 100% compliance, AHI 2.0/hour.  She depends on CPAP, cannot sleep without it.  Continues to use oxygen 24/7. Rapid flu test 03/17/17-Positive Influenza A  05/22/2017- 67 year old female never smoker followed for OSA, OHS, morbid obesity, complicated by history DVT/PE/warfarin, asthma/bronchitis, GERD, P A. fib CPAP auto 5-12 and oxygen 3 L sleep and portable/ [facility] ----OSA: DME [facility]. Pt wears CPAP nightly;pressure works well; DL attached and no new supplies needed at this time.  Body weight today 374 pounds  Download 100% compliance AHI 0.2/hour.  She cannot sleep without CPAP and worries about power outages. Uses albuterol rescue inhaler and nebulizer with Pulmicort at least once daily.  Still using Advair 500-discussed.  No recent exacerbation and she denies new medical problems.  ROS-see HPI  + = pos Constitutional:   No-   weight loss, night sweats, fevers, chills, fatigue,  lassitude. HEENT:   No-  headaches, difficulty swallowing, tooth/dental problems, sore throat,       No-  sneezing, itching, ear ache, nasal congestion, post nasal drip,  CV:  No-   chest pain, orthopnea, PND, swelling in lower extremities, anasarca,  dizziness, palpitations Resp: + shortness of breath with exertion or at rest.                productive cough,  non-productive cough,  No- coughing up of blood.               change in color of mucus.  No- wheezing.   Skin: No-   rash or lesions. GI:  No-   heartburn, indigestion, abdominal pain, nausea, vomiting,  GU:  MS:  No-   joint pain or swelling.   Neuro-     nothing unusual Psych:  No- change in mood or affect. No depression or anxiety.  No memory loss.  OBJ- Physical Exam General- Alert, Oriented, Affect-appropriate, Distress- none acute. +Morbid obesity.  Skin- rash-none, lesions- none, excoriation- none Lymphadenopathy- none Head- atraumatic            Eyes- Gross vision intact, PERRLA, conjunctivae and secretions clear            Ears- Hearing, canals-normal            Nose- Clear, no-Septal dev, mucus, polyps, erosion, perforation             Throat- Mallampati II-III , mucosa clear , drainage- none, tonsils- atrophic. No-hoarseness, throat clearing Neck- flexible , trachea midline, no stridor , thyroid nl, carotid no bruit Chest - symmetrical excursion , unlabored           Heart/CV- RRR , no murmur , no gallop  , no rub, nl s1 s2                           -  JVD- none , edema-+1, stasis changes+chronic, varices- none           Lung- clear to P&A, wheeze- none, cough + raspy, dullness-none, rub- none. Shallow c/w habitus           Chest wall-  Abd- Br/ Gen/ Rectal- Not done, not indicated Extrem- cyanosis- none, clubbing, none, atrophy- none, strength- nl Neuro- grossly intact to observation

## 2017-05-23 NOTE — Assessment & Plan Note (Signed)
Excellent compliance and control.  She continues to benefit and depend on CPAP.

## 2017-05-23 NOTE — Assessment & Plan Note (Signed)
She has not been able to change lifestyle sufficiently to accomplish meaningful weight loss.  Consider bariatric referral.

## 2017-05-23 NOTE — Assessment & Plan Note (Signed)
No change in ongoing oxygen requirement.

## 2017-06-07 DIAGNOSIS — G4733 Obstructive sleep apnea (adult) (pediatric): Secondary | ICD-10-CM | POA: Diagnosis not present

## 2017-06-07 DIAGNOSIS — I10 Essential (primary) hypertension: Secondary | ICD-10-CM | POA: Diagnosis not present

## 2017-06-07 DIAGNOSIS — I5032 Chronic diastolic (congestive) heart failure: Secondary | ICD-10-CM | POA: Diagnosis not present

## 2017-06-07 DIAGNOSIS — I2699 Other pulmonary embolism without acute cor pulmonale: Secondary | ICD-10-CM | POA: Diagnosis not present

## 2017-06-07 DIAGNOSIS — I509 Heart failure, unspecified: Secondary | ICD-10-CM | POA: Diagnosis not present

## 2017-06-07 DIAGNOSIS — J45909 Unspecified asthma, uncomplicated: Secondary | ICD-10-CM | POA: Diagnosis not present

## 2017-06-18 ENCOUNTER — Encounter: Payer: Self-pay | Admitting: Internal Medicine

## 2017-06-18 DIAGNOSIS — G4733 Obstructive sleep apnea (adult) (pediatric): Secondary | ICD-10-CM

## 2017-06-18 NOTE — Telephone Encounter (Signed)
Dr Annamaria Boots please advise if we may place order for repeat sleep study for patient, thank you.

## 2017-06-19 DIAGNOSIS — Z7901 Long term (current) use of anticoagulants: Secondary | ICD-10-CM | POA: Diagnosis not present

## 2017-06-19 DIAGNOSIS — Z791 Long term (current) use of non-steroidal anti-inflammatories (NSAID): Secondary | ICD-10-CM | POA: Diagnosis not present

## 2017-06-19 NOTE — Telephone Encounter (Signed)
Split night study has been ordered. Patient is aware. Will close this encounter.

## 2017-06-19 NOTE — Addendum Note (Signed)
Addended by: Valerie Salts on: 06/19/2017 09:49 AM   Modules accepted: Orders

## 2017-06-19 NOTE — Telephone Encounter (Signed)
Katie- please order NPSG split protocol for her. Dx OSA, Morbid obesity

## 2017-06-24 ENCOUNTER — Telehealth: Payer: Self-pay | Admitting: Internal Medicine

## 2017-06-24 NOTE — Telephone Encounter (Signed)
Valerie Salts, CMA       9:49 AM  Note    Addended by: Valerie Salts on: 06/19/2017 09:49 AM   Modules accepted: Orders        Valerie Salts, CMA       9:49 AM  Note    Split night study has been ordered. Patient is aware. Will close this encounter.       Valerie Salts, CMA  to Scharlene Corn R        9:49 AM  Hello Ms. Rhonda Acevedo,   Dr. Annamaria Boots has reviewed your email and would like to schedule a sleep study for you. I have placed the order for you. Our referrals department will contact you shortly to get this scheduled.    Last read by Filbert Berthold at 10:33 AM on 06/19/2017.         9:33 AM  You routed this conversation to [facility]           9:06 AM  Deneise Lever, MD routed this conversation to Me    Deneise Lever, MD       9:05 AM  Note    [name]- please order NPSG split protocol for her. Dx OSA, Morbid obesity     Jun 18, 2017         5:12 PM  Rinaldo Ratel, CMA routed this conversation to Deneise Lever, MD    Rinaldo Ratel, CMA  to Filbert Berthold        5:12 PM  Good afternoon Ms. Rhonda Acevedo,  We just wanted to let you know that we have received your e-mail and are forwarding it to Dr Annamaria Boots to advise on ordering another sleep study. We will let you know once we hear back from Dr. Annamaria Boots.   Thank you and have a great evening!  [facility]    Last read by Filbert Berthold at 6:47 PM on 06/18/2017.   Rinaldo Ratel, CMA       5:10 PM  Note    Dr Annamaria Boots please advise if we may place order for repeat sleep study for patient, thank you.      Filbert Berthold  to Deneise Lever, MD        2:19 PM  I received a notice from [place] [facility] stating insurance will not pay for cpap . That I need to pay full price of set up payments of $92 a month or turn cpap in or have a sleep study done. Since I can't afford the options. I need you to set up a sleep study  for me. Not looking forward to it because I now sleep on stomach and don't go to sleep until 2:00 am.   Thanks for your help. Rhonda Acevedo with [facility] at sleep center; aware that patient needs sleep study based on conversation via e-mail recently. She will pass the information on to [facility]. Nothing more needed at this time.

## 2017-07-03 DIAGNOSIS — Z7901 Long term (current) use of anticoagulants: Secondary | ICD-10-CM | POA: Diagnosis not present

## 2017-07-08 DIAGNOSIS — I5032 Chronic diastolic (congestive) heart failure: Secondary | ICD-10-CM | POA: Diagnosis not present

## 2017-07-08 DIAGNOSIS — G4733 Obstructive sleep apnea (adult) (pediatric): Secondary | ICD-10-CM | POA: Diagnosis not present

## 2017-07-08 DIAGNOSIS — I10 Essential (primary) hypertension: Secondary | ICD-10-CM | POA: Diagnosis not present

## 2017-07-08 DIAGNOSIS — I2699 Other pulmonary embolism without acute cor pulmonale: Secondary | ICD-10-CM | POA: Diagnosis not present

## 2017-07-08 DIAGNOSIS — J45909 Unspecified asthma, uncomplicated: Secondary | ICD-10-CM | POA: Diagnosis not present

## 2017-07-08 DIAGNOSIS — I509 Heart failure, unspecified: Secondary | ICD-10-CM | POA: Diagnosis not present

## 2017-07-31 DIAGNOSIS — Z7901 Long term (current) use of anticoagulants: Secondary | ICD-10-CM | POA: Diagnosis not present

## 2017-08-07 DIAGNOSIS — I5032 Chronic diastolic (congestive) heart failure: Secondary | ICD-10-CM | POA: Diagnosis not present

## 2017-08-07 DIAGNOSIS — G4733 Obstructive sleep apnea (adult) (pediatric): Secondary | ICD-10-CM | POA: Diagnosis not present

## 2017-08-07 DIAGNOSIS — J45909 Unspecified asthma, uncomplicated: Secondary | ICD-10-CM | POA: Diagnosis not present

## 2017-08-07 DIAGNOSIS — I509 Heart failure, unspecified: Secondary | ICD-10-CM | POA: Diagnosis not present

## 2017-08-07 DIAGNOSIS — I2699 Other pulmonary embolism without acute cor pulmonale: Secondary | ICD-10-CM | POA: Diagnosis not present

## 2017-08-07 DIAGNOSIS — I10 Essential (primary) hypertension: Secondary | ICD-10-CM | POA: Diagnosis not present

## 2017-08-28 DIAGNOSIS — Z7901 Long term (current) use of anticoagulants: Secondary | ICD-10-CM | POA: Diagnosis not present

## 2017-09-05 ENCOUNTER — Other Ambulatory Visit: Payer: Self-pay | Admitting: Cardiology

## 2017-09-07 DIAGNOSIS — I10 Essential (primary) hypertension: Secondary | ICD-10-CM | POA: Diagnosis not present

## 2017-09-07 DIAGNOSIS — G4733 Obstructive sleep apnea (adult) (pediatric): Secondary | ICD-10-CM | POA: Diagnosis not present

## 2017-09-07 DIAGNOSIS — I2699 Other pulmonary embolism without acute cor pulmonale: Secondary | ICD-10-CM | POA: Diagnosis not present

## 2017-09-07 DIAGNOSIS — I5032 Chronic diastolic (congestive) heart failure: Secondary | ICD-10-CM | POA: Diagnosis not present

## 2017-09-07 DIAGNOSIS — J45909 Unspecified asthma, uncomplicated: Secondary | ICD-10-CM | POA: Diagnosis not present

## 2017-09-07 DIAGNOSIS — I509 Heart failure, unspecified: Secondary | ICD-10-CM | POA: Diagnosis not present

## 2017-09-11 DIAGNOSIS — Z7901 Long term (current) use of anticoagulants: Secondary | ICD-10-CM | POA: Diagnosis not present

## 2017-09-17 DIAGNOSIS — Z1231 Encounter for screening mammogram for malignant neoplasm of breast: Secondary | ICD-10-CM | POA: Diagnosis not present

## 2017-09-17 DIAGNOSIS — Z803 Family history of malignant neoplasm of breast: Secondary | ICD-10-CM | POA: Diagnosis not present

## 2017-10-02 DIAGNOSIS — Z23 Encounter for immunization: Secondary | ICD-10-CM | POA: Diagnosis not present

## 2017-10-02 DIAGNOSIS — I10 Essential (primary) hypertension: Secondary | ICD-10-CM | POA: Diagnosis not present

## 2017-10-02 DIAGNOSIS — I2699 Other pulmonary embolism without acute cor pulmonale: Secondary | ICD-10-CM | POA: Diagnosis not present

## 2017-10-02 DIAGNOSIS — R7303 Prediabetes: Secondary | ICD-10-CM | POA: Diagnosis not present

## 2017-10-02 DIAGNOSIS — I4891 Unspecified atrial fibrillation: Secondary | ICD-10-CM | POA: Diagnosis not present

## 2017-10-02 DIAGNOSIS — M1990 Unspecified osteoarthritis, unspecified site: Secondary | ICD-10-CM | POA: Diagnosis not present

## 2017-10-02 DIAGNOSIS — I5032 Chronic diastolic (congestive) heart failure: Secondary | ICD-10-CM | POA: Diagnosis not present

## 2017-10-02 DIAGNOSIS — J4540 Moderate persistent asthma, uncomplicated: Secondary | ICD-10-CM | POA: Diagnosis not present

## 2017-10-02 DIAGNOSIS — E7800 Pure hypercholesterolemia, unspecified: Secondary | ICD-10-CM | POA: Diagnosis not present

## 2017-10-02 DIAGNOSIS — G8929 Other chronic pain: Secondary | ICD-10-CM | POA: Diagnosis not present

## 2017-10-08 DIAGNOSIS — I509 Heart failure, unspecified: Secondary | ICD-10-CM | POA: Diagnosis not present

## 2017-10-08 DIAGNOSIS — I10 Essential (primary) hypertension: Secondary | ICD-10-CM | POA: Diagnosis not present

## 2017-10-08 DIAGNOSIS — J45909 Unspecified asthma, uncomplicated: Secondary | ICD-10-CM | POA: Diagnosis not present

## 2017-10-08 DIAGNOSIS — I2699 Other pulmonary embolism without acute cor pulmonale: Secondary | ICD-10-CM | POA: Diagnosis not present

## 2017-10-08 DIAGNOSIS — G4733 Obstructive sleep apnea (adult) (pediatric): Secondary | ICD-10-CM | POA: Diagnosis not present

## 2017-10-08 DIAGNOSIS — I5032 Chronic diastolic (congestive) heart failure: Secondary | ICD-10-CM | POA: Diagnosis not present

## 2017-10-09 ENCOUNTER — Encounter: Payer: Self-pay | Admitting: *Deleted

## 2017-10-10 ENCOUNTER — Ambulatory Visit: Payer: PPO | Admitting: Cardiology

## 2017-10-10 ENCOUNTER — Encounter: Payer: Self-pay | Admitting: Cardiology

## 2017-10-10 VITALS — BP 120/60 | HR 65 | Ht 66.0 in | Wt 376.0 lb

## 2017-10-10 DIAGNOSIS — I10 Essential (primary) hypertension: Secondary | ICD-10-CM | POA: Diagnosis not present

## 2017-10-10 DIAGNOSIS — D6859 Other primary thrombophilia: Secondary | ICD-10-CM | POA: Diagnosis not present

## 2017-10-10 DIAGNOSIS — I480 Paroxysmal atrial fibrillation: Secondary | ICD-10-CM

## 2017-10-10 DIAGNOSIS — R001 Bradycardia, unspecified: Secondary | ICD-10-CM | POA: Diagnosis not present

## 2017-10-10 MED ORDER — DILTIAZEM HCL ER COATED BEADS 240 MG PO CP24: 240.0000 mg | ORAL_CAPSULE | Freq: Every day | ORAL | 3 refills | Status: DC

## 2017-10-10 MED ORDER — FLECAINIDE ACETATE 50 MG PO TABS: 50.0000 mg | ORAL_TABLET | Freq: Two times a day (BID) | ORAL | 2 refills | Status: DC

## 2017-10-10 NOTE — Progress Notes (Signed)
Cardiology Office Note  Date: 10/10/2017   ID: CALLEIGH Acevedo, DOB [birth year], MRN [id number]  PCP: Rhonda Low, MD  Primary Cardiologist: Rhonda Lesches, MD   Chief Complaint  Patient presents with  . PAF    History of Present Illness: Rhonda Acevedo is a 67 y.o. female last seen in July 2018.  She presents overdue for follow-up. She does not report any significant palpitations, states that she has had occasional lightheadedness and documented bradycardia with heart rate in the 40s.  She has had no chest pain or syncope.  Remains functionally limited with morbid obesity and arthritic pain.  She does use a cane, reports no falls.  She continues on Coumadin with follow-up per PCP.  She denies any spontaneous bleeding episodes.  I personally reviewed her ECG today which shows sinus rhythm with Acevedo voltage, poor R wave progression, IVCD.  Current cardiac regimen includes Cardizem CD at 300 mg daily, flecainide, Lasix, Cozaar, and Coumadin.  We discussed reducing Cardizem CD to 240 mg daily for now.  Past Medical History:  Diagnosis Date  . Allergic rhinitis   . Esophageal reflux   . Essential hypertension, benign   . Factor V Leiden mutation ([place])   . GERD (gastroesophageal reflux disease)   . History of cardiac catheterization    Normal coronaries July 2015  . History of DVT (deep vein thrombosis)   . History of pulmonary embolism    Multiple, status post IVC filter, on Coumadin  . Mixed hyperlipidemia   . Obstructive sleep apnea    NPSG 05-29-07; AHI 32.9,cpap 7 to 9  . On home oxygen therapy    2 liters at night- usual- no recent changes  . Osteoarthritis   . Persistent atrial fibrillation [facility])    Documented October 2016  . Transfusion history    [facility]    Past Surgical History:  Procedure Laterality Date  . CHOLECYSTECTOMY    . COLONOSCOPY WITH PROPOFOL N/A 06/13/2015   Procedure: COLONOSCOPY WITH PROPOFOL;   Surgeon: Garlan Fair, MD;  Location: [facility];  Service: Endoscopy;  Laterality: N/A;  . ENDOMETRIAL ABLATION    . GANGLION CYST EXCISION    . Hernia abscess    . HERNIA REPAIR     umbilical  . I&D hernia incisional abscess    . IVC filter Right   . LEFT HEART CATHETERIZATION WITH CORONARY ANGIOGRAM N/A 08/20/2013   Procedure: LEFT HEART CATHETERIZATION WITH CORONARY ANGIOGRAM;  Surgeon: Blane Ohara, MD;  Location: [facility] CATH LAB;  Service: Cardiovascular;  Laterality: N/A;  . ROTATOR CUFF REPAIR      Current Outpatient Medications  Medication Sig Dispense Refill  . Acetaminophen (TYLENOL ARTHRITIS PAIN PO) Take 2 tablets by mouth every 8 (eight) hours as needed (For pain.).     Marland Kitchen albuterol (ACCUNEB) 1.25 MG/3ML nebulizer solution Take 1 ampule by nebulization every 6 (six) hours as needed for wheezing or shortness of breath.     Marland Kitchen albuterol (PROAIR HFA) 108 (90 BASE) MCG/ACT inhaler Inhale 2 puffs into the lungs 4 (four) times daily as needed for wheezing or shortness of breath.     . cetirizine (ZYRTEC) 10 MG tablet Take 10 mg by mouth daily.     Marland Kitchen diltiazem (CARDIZEM CD) 240 MG 24 hr capsule Take 1 capsule (240 mg total) by mouth daily. 90 capsule 3  . famotidine (PEPCID) 20 MG tablet Take 40 mg by mouth 2 (two) times daily.     Marland Kitchen  flecainide (TAMBOCOR) 50 MG tablet Take 1 tablet (50 mg total) by mouth 2 (two) times daily. 180 tablet 2  . Fluticasone-Salmeterol (ADVAIR) 500-50 MCG/DOSE AEPB Inhale 1 puff into the lungs every 12 (twelve) hours.    . furosemide (LASIX) 20 MG tablet Take 20 mg by mouth 2 (two) times daily.     Marland Kitchen losartan (COZAAR) 50 MG tablet Take 1 tablet by mouth daily.    Marland Kitchen oxybutynin (DITROPAN) 5 MG tablet Take 5 mg by mouth 2 (two) times daily.    . polyethylene glycol powder (GLYCOLAX/MIRALAX) powder Take 17 g by mouth at bedtime.     . pravastatin (PRAVACHOL) 80 MG tablet Take 80 mg by mouth at bedtime.     Marland Kitchen warfarin (COUMADIN) 10 MG tablet Take 10 mg by  mouth See admin instructions. Take one tablet daily except none of Friday.     No current facility-administered medications for this visit.    Allergies:  Adhesive [tape] and Benazepril hcl   Social History: The patient  reports that she has never smoked. She has never used smokeless tobacco. She reports that she does not drink alcohol or use drugs.   ROS:  Please see the history of present illness. Otherwise, complete review of systems is positive for chronic arthritic pain and stiffness.  All other systems are reviewed and negative.   Physical Exam: VS:  BP 120/60   Pulse 65   Ht 5\' 6"  (1.676 m)   Wt (!) 376 lb (170.6 kg)   SpO2 94%   BMI 60.69 kg/m , BMI Body mass index is 60.69 kg/m.  Wt Readings from Last 3 Encounters:  10/10/17 (!) 376 lb (170.6 kg)  05/22/17 (!) 374 lb 4.8 oz (169.8 kg)  03/17/17 (!) 370 lb 9.6 oz (168.1 kg)    General: Morbidly obese woman, uses a cane. HEENT: Conjunctiva and lids normal, oropharynx clear. Neck: Supple, no elevated JVP or carotid bruits, no thyromegaly. Lungs: Minutes breath sounds, no wheezing, nonlabored breathing at rest. Cardiac: Regular rate and rhythm, no S3, soft systolic murmur. Abdomen: Morbidly obese with pannus, nontender, bowel sounds present. Extremities: Chronic appearing edema and adipose tissue,, distal pulses 2+. Skin: Warm and dry. Musculoskeletal: No kyphosis. Neuropsychiatric: Alert and oriented x3, affect grossly appropriate.  ECG: I personally reviewed the tracing from 08/01/2016 which showed sinus rhythm with Acevedo voltage, poor R wave progression, and nonspecific T wave changes.  Other Studies Reviewed Today:  Echocardiogram 11/02/2014: Study Conclusions  - Left ventricle: The cavity size was normal. Wall thickness was normal. Systolic function was normal. The estimated ejection fraction was in the range of 55% to 60%. Wall motion was normal; there were no regional wall motion abnormalities. - Left  atrium: The atrium was mildly to moderately dilated. - Right atrium: The atrium was mildly dilated.  Assessment and Plan:  1.  Paroxysmal atrial fibrillation.  No obvious rhythm recurrence on flecainide.  She continues on Cardizem CD along with Coumadin which is followed by her PCP.  2.  Possible symptomatic bradycardia while in sinus rhythm.  Reduce Cardizem CD to 240 mg daily.  3.  Hypercoagulable state with factor V Leiden mutation and history of recurrent DVTs and pulmonary embolus.  She continues on long-term anticoagulation with follow-up per PCP.  4.  Essential hypertension, blood pressure is normal today.  Current medicines were reviewed with the patient today.   Orders Placed This Encounter  Procedures  . EKG 12-Lead    Disposition: Follow-up in 6 months.  Signed, Satira Sark, MD, [facility] 10/10/2017 2:28 PM    [facility] [address] [place], [place] [address] Phone: [phone]; Fax: [phone]

## 2017-10-10 NOTE — Patient Instructions (Signed)
Your physician wants you to follow-up in: 6 months with Dr.McDowell You will receive a reminder letter in the mail two months in advance. If you don't receive a letter, please call our office to schedule the follow-up appointment.     DECREASE Cardizem to 240 mg daily    If you need a refill on your cardiac medications before your next appointment, please call your pharmacy.      No lab work or tests ordered today.      Thank you for choosing [facility] !

## 2017-10-16 DIAGNOSIS — Z7901 Long term (current) use of anticoagulants: Secondary | ICD-10-CM | POA: Diagnosis not present

## 2017-10-20 DIAGNOSIS — Z7901 Long term (current) use of anticoagulants: Secondary | ICD-10-CM | POA: Diagnosis not present

## 2017-10-27 DIAGNOSIS — Z7901 Long term (current) use of anticoagulants: Secondary | ICD-10-CM | POA: Diagnosis not present

## 2017-11-07 DIAGNOSIS — I5032 Chronic diastolic (congestive) heart failure: Secondary | ICD-10-CM | POA: Diagnosis not present

## 2017-11-07 DIAGNOSIS — G4733 Obstructive sleep apnea (adult) (pediatric): Secondary | ICD-10-CM | POA: Diagnosis not present

## 2017-11-07 DIAGNOSIS — J45909 Unspecified asthma, uncomplicated: Secondary | ICD-10-CM | POA: Diagnosis not present

## 2017-11-07 DIAGNOSIS — I10 Essential (primary) hypertension: Secondary | ICD-10-CM | POA: Diagnosis not present

## 2017-11-07 DIAGNOSIS — I509 Heart failure, unspecified: Secondary | ICD-10-CM | POA: Diagnosis not present

## 2017-11-07 DIAGNOSIS — I2699 Other pulmonary embolism without acute cor pulmonale: Secondary | ICD-10-CM | POA: Diagnosis not present

## 2017-11-11 DIAGNOSIS — Z7901 Long term (current) use of anticoagulants: Secondary | ICD-10-CM | POA: Diagnosis not present

## 2017-12-08 DIAGNOSIS — J45909 Unspecified asthma, uncomplicated: Secondary | ICD-10-CM | POA: Diagnosis not present

## 2017-12-08 DIAGNOSIS — I509 Heart failure, unspecified: Secondary | ICD-10-CM | POA: Diagnosis not present

## 2017-12-08 DIAGNOSIS — I5032 Chronic diastolic (congestive) heart failure: Secondary | ICD-10-CM | POA: Diagnosis not present

## 2017-12-08 DIAGNOSIS — G4733 Obstructive sleep apnea (adult) (pediatric): Secondary | ICD-10-CM | POA: Diagnosis not present

## 2017-12-08 DIAGNOSIS — I2699 Other pulmonary embolism without acute cor pulmonale: Secondary | ICD-10-CM | POA: Diagnosis not present

## 2017-12-08 DIAGNOSIS — I10 Essential (primary) hypertension: Secondary | ICD-10-CM | POA: Diagnosis not present

## 2017-12-09 DIAGNOSIS — Z7901 Long term (current) use of anticoagulants: Secondary | ICD-10-CM | POA: Diagnosis not present

## 2017-12-23 DIAGNOSIS — M25562 Pain in left knee: Secondary | ICD-10-CM | POA: Insufficient documentation

## 2017-12-23 DIAGNOSIS — E669 Obesity, unspecified: Secondary | ICD-10-CM | POA: Diagnosis not present

## 2017-12-23 DIAGNOSIS — M25561 Pain in right knee: Secondary | ICD-10-CM | POA: Diagnosis not present

## 2017-12-23 DIAGNOSIS — M1712 Unilateral primary osteoarthritis, left knee: Secondary | ICD-10-CM | POA: Diagnosis not present

## 2017-12-23 DIAGNOSIS — Z6841 Body Mass Index (BMI) 40.0 and over, adult: Secondary | ICD-10-CM | POA: Diagnosis not present

## 2017-12-23 DIAGNOSIS — M1711 Unilateral primary osteoarthritis, right knee: Secondary | ICD-10-CM | POA: Diagnosis not present

## 2017-12-30 DIAGNOSIS — M1712 Unilateral primary osteoarthritis, left knee: Secondary | ICD-10-CM | POA: Diagnosis not present

## 2017-12-30 DIAGNOSIS — M1711 Unilateral primary osteoarthritis, right knee: Secondary | ICD-10-CM | POA: Diagnosis not present

## 2017-12-30 DIAGNOSIS — Z7901 Long term (current) use of anticoagulants: Secondary | ICD-10-CM | POA: Diagnosis not present

## 2018-01-06 DIAGNOSIS — H26491 Other secondary cataract, right eye: Secondary | ICD-10-CM | POA: Diagnosis not present

## 2018-01-07 DIAGNOSIS — I5032 Chronic diastolic (congestive) heart failure: Secondary | ICD-10-CM | POA: Diagnosis not present

## 2018-01-07 DIAGNOSIS — J45909 Unspecified asthma, uncomplicated: Secondary | ICD-10-CM | POA: Diagnosis not present

## 2018-01-07 DIAGNOSIS — I509 Heart failure, unspecified: Secondary | ICD-10-CM | POA: Diagnosis not present

## 2018-01-07 DIAGNOSIS — I2699 Other pulmonary embolism without acute cor pulmonale: Secondary | ICD-10-CM | POA: Diagnosis not present

## 2018-01-07 DIAGNOSIS — I10 Essential (primary) hypertension: Secondary | ICD-10-CM | POA: Diagnosis not present

## 2018-01-07 DIAGNOSIS — G4733 Obstructive sleep apnea (adult) (pediatric): Secondary | ICD-10-CM | POA: Diagnosis not present

## 2018-01-27 DIAGNOSIS — Z7901 Long term (current) use of anticoagulants: Secondary | ICD-10-CM | POA: Diagnosis not present

## 2018-02-03 DIAGNOSIS — Z8719 Personal history of other diseases of the digestive system: Secondary | ICD-10-CM | POA: Diagnosis not present

## 2018-02-03 DIAGNOSIS — N3281 Overactive bladder: Secondary | ICD-10-CM | POA: Diagnosis not present

## 2018-02-03 DIAGNOSIS — Z8679 Personal history of other diseases of the circulatory system: Secondary | ICD-10-CM | POA: Diagnosis not present

## 2018-02-03 DIAGNOSIS — Z9889 Other specified postprocedural states: Secondary | ICD-10-CM | POA: Diagnosis not present

## 2018-02-03 DIAGNOSIS — Z803 Family history of malignant neoplasm of breast: Secondary | ICD-10-CM | POA: Diagnosis not present

## 2018-02-07 DIAGNOSIS — I5032 Chronic diastolic (congestive) heart failure: Secondary | ICD-10-CM | POA: Diagnosis not present

## 2018-02-07 DIAGNOSIS — J45909 Unspecified asthma, uncomplicated: Secondary | ICD-10-CM | POA: Diagnosis not present

## 2018-02-07 DIAGNOSIS — I509 Heart failure, unspecified: Secondary | ICD-10-CM | POA: Diagnosis not present

## 2018-02-07 DIAGNOSIS — G4733 Obstructive sleep apnea (adult) (pediatric): Secondary | ICD-10-CM | POA: Diagnosis not present

## 2018-02-07 DIAGNOSIS — I2699 Other pulmonary embolism without acute cor pulmonale: Secondary | ICD-10-CM | POA: Diagnosis not present

## 2018-02-07 DIAGNOSIS — I10 Essential (primary) hypertension: Secondary | ICD-10-CM | POA: Diagnosis not present

## 2018-02-17 DIAGNOSIS — Z7901 Long term (current) use of anticoagulants: Secondary | ICD-10-CM | POA: Diagnosis not present

## 2018-03-10 DIAGNOSIS — I2699 Other pulmonary embolism without acute cor pulmonale: Secondary | ICD-10-CM | POA: Diagnosis not present

## 2018-03-10 DIAGNOSIS — J45909 Unspecified asthma, uncomplicated: Secondary | ICD-10-CM | POA: Diagnosis not present

## 2018-03-10 DIAGNOSIS — I509 Heart failure, unspecified: Secondary | ICD-10-CM | POA: Diagnosis not present

## 2018-03-10 DIAGNOSIS — I5032 Chronic diastolic (congestive) heart failure: Secondary | ICD-10-CM | POA: Diagnosis not present

## 2018-03-10 DIAGNOSIS — I10 Essential (primary) hypertension: Secondary | ICD-10-CM | POA: Diagnosis not present

## 2018-03-10 DIAGNOSIS — G4733 Obstructive sleep apnea (adult) (pediatric): Secondary | ICD-10-CM | POA: Diagnosis not present

## 2018-03-16 ENCOUNTER — Encounter: Payer: Self-pay | Admitting: Internal Medicine

## 2018-03-16 DIAGNOSIS — Z7901 Long term (current) use of anticoagulants: Secondary | ICD-10-CM | POA: Diagnosis not present

## 2018-03-19 ENCOUNTER — Ambulatory Visit: Payer: PPO | Admitting: Internal Medicine

## 2018-03-19 ENCOUNTER — Encounter: Payer: Self-pay | Admitting: Internal Medicine

## 2018-03-19 VITALS — BP 130/80 | HR 61 | Ht 65.0 in | Wt 372.0 lb

## 2018-03-19 DIAGNOSIS — J45909 Unspecified asthma, uncomplicated: Secondary | ICD-10-CM | POA: Diagnosis not present

## 2018-03-19 DIAGNOSIS — J9611 Chronic respiratory failure with hypoxia: Secondary | ICD-10-CM | POA: Diagnosis not present

## 2018-03-19 DIAGNOSIS — G4733 Obstructive sleep apnea (adult) (pediatric): Secondary | ICD-10-CM | POA: Diagnosis not present

## 2018-03-19 NOTE — Assessment & Plan Note (Signed)
This is not changing.

## 2018-03-19 NOTE — Assessment & Plan Note (Signed)
Please sleep on 2 L of ox began having headaches during the night, prevented by returning to 3 L this can be continued.  She is not usually using portable oxygen at all which is a good sign.

## 2018-03-19 NOTE — Patient Instructions (Signed)
Ok to continue to use O2 3L esp for sleep  Ok to continue CPAP auto 4-12, mask of choice, humidifier, supplies, AirView/ card  We can continue current inhaled medicines- please let us know if you need refills

## 2018-03-19 NOTE — Assessment & Plan Note (Signed)
She has not been using bronchodilators and has not felt the need, reporting minimal cough and wheeze only very occasionally. Plan-we will leave her inhaled meds on her list for refill if needed

## 2018-03-19 NOTE — Assessment & Plan Note (Signed)
She continues to sleep better with CPAP plus oxygen.  Download confirms excellent compliance and control. Continue CPAP auto 4-12 with oxygen at 3 L.

## 2018-03-19 NOTE — Progress Notes (Signed)
HPI F followed for OSA, OHS, morbid obesity, chronic hypoxic respiratory failure, complicated by hx DVT/ PE/warfarin, asthma/ bronchitis, GERD PFT 04/12/2011-normal spirometry flows within significant response to bronchodilator. FEV1/FVC 0.79. Mild restriction and moderate diffusion reduction both may be do to her obesity. PFT: 02/10/2014-obstructive and restrictive changes and reduction of diffusion best explained by her obesity/hypoventilation syndrome ----------------------------------------------------------------------  05/22/2017- 68 year old female never smoker followed for OSA, OHS, morbid obesity, complicated by history DVT/PE/warfarin, asthma/bronchitis, GERD, P A. fib CPAP auto 5-12 and oxygen 3 L sleep and portable/ [facility] ----OSA: DME [facility]. Pt wears CPAP nightly;pressure works well; DL attached and no new supplies needed at this time.  Body weight today 374 pounds  Download 100% compliance AHI 0.2/hour.  She cannot sleep without CPAP and worries about power outages. Uses albuterol rescue inhaler and nebulizer with Pulmicort at least once daily.  Still using Advair 500-discussed.  No recent exacerbation and she denies new medical problems.  03/19/2018- 68 year old female never smoker followed for OSA, OHS, chronic hypoxic respiratory failure, morbid obesity, complicated by history DVT/PE/warfarin, asthma/bronchitis, GERD, P A. fib CPAP auto 4-12 and oxygen 3 L sleep and portable/ [facility] -----Pt states she has been doing well since last visit and denies any complaints. Body weight today 372 lbs Download 100% compliance, AHI 0.3/ hr Neb albuterol, ProAirHFA, Advair 500,  Has gone through this winter without catching flu.  Breathing is good.  She has not used any of her respiratory medicines since March 29-not needed and without routine cough or wheeze.  Only uses oxygen at night.  Tried 2 L but started having headaches, resolved by returning to 3 L during sleep.  ROS-see HPI  + =  positive Constitutional:   No-   weight loss, night sweats, fevers, chills, fatigue, lassitude. HEENT:   No-  headaches, difficulty swallowing, tooth/dental problems, sore throat,       No-  sneezing, itching, ear ache, nasal congestion, post nasal drip,  CV:  No-   chest pain, orthopnea, PND, swelling in lower extremities, anasarca,  dizziness, palpitations Resp: + shortness of breath with exertion or at rest.                productive cough,  non-productive cough,  No- coughing up of blood.               change in color of mucus.  No- wheezing.   Skin: No-   rash or lesions. GI:  No-   heartburn, indigestion, abdominal pain, nausea, vomiting,  GU:  MS:  No-   joint pain or swelling.   Neuro-     nothing unusual Psych:  No- change in mood or affect. No depression or anxiety.  No memory loss.  OBJ- Physical Exam General- Alert, Oriented, Affect-appropriate, Distress- none acute. +Morbid obesity.  Skin- rash-none, lesions- none, excoriation- none Lymphadenopathy- none Head- atraumatic            Eyes- Gross vision intact, PERRLA, conjunctivae and secretions clear            Ears- Hearing, canals-normal            Nose- Clear, no-Septal dev, mucus, polyps, erosion, perforation             Throat- Mallampati II-III , mucosa clear , drainage- none, tonsils- atrophic. No-hoarseness, throat clearing Neck- flexible , trachea midline, no stridor , thyroid nl, carotid no bruit Chest - symmetrical excursion , unlabored           Heart/CV- RRR ,  no murmur , no gallop  , no rub, nl s1 s2                           - JVD- none , edema-+1, stasis changes+chronic, varices- none           Lung- clear to P&A, wheeze- none, cough- none, dullness-none, rub- none. Shallow c/w habitus           Chest wall-  Abd- Br/ Gen/ Rectal- Not done, not indicated Extrem- cyanosis- none, clubbing, none, atrophy- none, strength- nl Neuro- grossly intact to observation

## 2018-03-30 DIAGNOSIS — M1712 Unilateral primary osteoarthritis, left knee: Secondary | ICD-10-CM | POA: Diagnosis not present

## 2018-03-30 DIAGNOSIS — M1711 Unilateral primary osteoarthritis, right knee: Secondary | ICD-10-CM | POA: Diagnosis not present

## 2018-03-30 DIAGNOSIS — Z6841 Body Mass Index (BMI) 40.0 and over, adult: Secondary | ICD-10-CM | POA: Diagnosis not present

## 2018-04-06 DIAGNOSIS — I5032 Chronic diastolic (congestive) heart failure: Secondary | ICD-10-CM | POA: Diagnosis not present

## 2018-04-06 DIAGNOSIS — R7303 Prediabetes: Secondary | ICD-10-CM | POA: Diagnosis not present

## 2018-04-06 DIAGNOSIS — Z1159 Encounter for screening for other viral diseases: Secondary | ICD-10-CM | POA: Diagnosis not present

## 2018-04-06 DIAGNOSIS — Z6841 Body Mass Index (BMI) 40.0 and over, adult: Secondary | ICD-10-CM | POA: Diagnosis not present

## 2018-04-06 DIAGNOSIS — G4733 Obstructive sleep apnea (adult) (pediatric): Secondary | ICD-10-CM | POA: Diagnosis not present

## 2018-04-06 DIAGNOSIS — Z1389 Encounter for screening for other disorder: Secondary | ICD-10-CM | POA: Diagnosis not present

## 2018-04-06 DIAGNOSIS — I4891 Unspecified atrial fibrillation: Secondary | ICD-10-CM | POA: Diagnosis not present

## 2018-04-06 DIAGNOSIS — E7800 Pure hypercholesterolemia, unspecified: Secondary | ICD-10-CM | POA: Diagnosis not present

## 2018-04-06 DIAGNOSIS — Z0000 Encounter for general adult medical examination without abnormal findings: Secondary | ICD-10-CM | POA: Diagnosis not present

## 2018-04-06 DIAGNOSIS — J4540 Moderate persistent asthma, uncomplicated: Secondary | ICD-10-CM | POA: Diagnosis not present

## 2018-04-06 DIAGNOSIS — Z7901 Long term (current) use of anticoagulants: Secondary | ICD-10-CM | POA: Diagnosis not present

## 2018-04-06 DIAGNOSIS — D510 Vitamin B12 deficiency anemia due to intrinsic factor deficiency: Secondary | ICD-10-CM | POA: Diagnosis not present

## 2018-04-06 DIAGNOSIS — I2699 Other pulmonary embolism without acute cor pulmonale: Secondary | ICD-10-CM | POA: Diagnosis not present

## 2018-04-06 DIAGNOSIS — I10 Essential (primary) hypertension: Secondary | ICD-10-CM | POA: Diagnosis not present

## 2018-04-07 DIAGNOSIS — M1711 Unilateral primary osteoarthritis, right knee: Secondary | ICD-10-CM | POA: Diagnosis not present

## 2018-04-21 DIAGNOSIS — Z7901 Long term (current) use of anticoagulants: Secondary | ICD-10-CM | POA: Diagnosis not present

## 2018-04-28 DIAGNOSIS — Z7901 Long term (current) use of anticoagulants: Secondary | ICD-10-CM | POA: Diagnosis not present

## 2018-04-28 DIAGNOSIS — M4310 Spondylolisthesis, site unspecified: Secondary | ICD-10-CM | POA: Diagnosis not present

## 2018-04-28 DIAGNOSIS — M419 Scoliosis, unspecified: Secondary | ICD-10-CM | POA: Diagnosis not present

## 2018-05-05 DIAGNOSIS — E785 Hyperlipidemia, unspecified: Secondary | ICD-10-CM | POA: Diagnosis not present

## 2018-05-05 DIAGNOSIS — D122 Benign neoplasm of ascending colon: Secondary | ICD-10-CM | POA: Diagnosis not present

## 2018-05-05 DIAGNOSIS — K3189 Other diseases of stomach and duodenum: Secondary | ICD-10-CM | POA: Diagnosis not present

## 2018-05-05 DIAGNOSIS — K219 Gastro-esophageal reflux disease without esophagitis: Secondary | ICD-10-CM | POA: Diagnosis not present

## 2018-05-05 DIAGNOSIS — I10 Essential (primary) hypertension: Secondary | ICD-10-CM | POA: Diagnosis not present

## 2018-05-05 DIAGNOSIS — Z7901 Long term (current) use of anticoagulants: Secondary | ICD-10-CM | POA: Diagnosis not present

## 2018-05-05 DIAGNOSIS — D696 Thrombocytopenia, unspecified: Secondary | ICD-10-CM | POA: Diagnosis not present

## 2018-05-05 DIAGNOSIS — K317 Polyp of stomach and duodenum: Secondary | ICD-10-CM | POA: Diagnosis not present

## 2018-05-13 ENCOUNTER — Telehealth: Payer: Self-pay | Admitting: Cardiology

## 2018-05-13 NOTE — Telephone Encounter (Signed)
Virtual Visit Pre-Appointment Phone Call  "(Name), I am calling you today to discuss your upcoming appointment. We are currently trying to limit exposure to the virus that causes COVID-19 by seeing patients at home rather than in the office."  1. "What is the BEST phone number to call the day of the visit?" - include this in appointment notes  2. Do you have or have access to (through a family member/friend) a smartphone with video capability that we can use for your visit?" a. If yes - list this number in appt notes as cell (if different from BEST phone #) and list the appointment type as a VIDEO visit in appointment notes b. If no - list the appointment type as a PHONE visit in appointment notes  3. Confirm consent - "In the setting of the current Covid19 crisis, you are scheduled for a (phone or video) visit with your provider on (date) at (time).  Just as we do with many in-office visits, in order for you to participate in this visit, we must obtain consent.  If you'd like, I can send this to your mychart (if signed up) or email for you to review.  Otherwise, I can obtain your verbal consent now.  All virtual visits are billed to your insurance company just like a normal visit would be.  By agreeing to a virtual visit, we'd like you to understand that the technology does not allow for your provider to perform an examination, and thus may limit your provider's ability to fully assess your condition. If your provider identifies any concerns that need to be evaluated in person, we will make arrangements to do so.  Finally, though the technology is pretty good, we cannot assure that it will always work on either your or our end, and in the setting of a video visit, we may have to convert it to a phone-only visit.  In either situation, we cannot ensure that we have a secure connection.  Are you willing to proceed?" STAFF: Did the patient verbally acknowledge consent to telehealth visit? Document  YES/NO here: Yes  4. Advise patient to be prepared - "Two hours prior to your appointment, go ahead and check your blood pressure, pulse, oxygen saturation, and your weight (if you have the equipment to check those) and write them all down. When your visit starts, your provider will ask you for this information. If you have an Apple Watch or Kardia device, please plan to have heart rate information ready on the day of your appointment. Please have a pen and paper handy nearby the day of the visit as well."  5. Give patient instructions for MyChart download to smartphone OR [email or web address] as below if video visit (depending on what platform provider is using)  6. Inform patient they will receive a phone call 15 minutes prior to their appointment time (may be from unknown caller ID) so they should be prepared to answer    TELEPHONE CALL NOTE  Rhonda Acevedo has been deemed a candidate for a follow-up tele-health visit to limit community exposure during the Covid-19 pandemic. I spoke with the patient via phone to ensure availability of phone/video source, confirm preferred email & phone number, and discuss instructions and expectations.  I reminded Rhonda Acevedo to be prepared with any vital sign and/or heart rhythm information that could potentially be obtained via home monitoring, at the time of her visit. I reminded Rhonda Acevedo to expect a phone call prior to  her visit.  Orinda Kenner 05/13/2018 12:44 PM

## 2018-05-19 NOTE — Progress Notes (Signed)
Virtual Visit via Telephone Note   This visit type was conducted due to national recommendations for restrictions regarding the COVID-19 Pandemic (e.g. social distancing) in an effort to limit this patient's exposure and mitigate transmission in our community.  Due to her co-morbid illnesses, this patient is at least at moderate risk for complications without adequate follow up.  This format is felt to be most appropriate for this patient at this time.  The patient did not have access to video technology/had technical difficulties with video requiring transitioning to audio format only (telephone).  All issues noted in this document were discussed and addressed.  No physical exam could be performed with this format.  Please refer to the patient's chart for her  consent to telehealth for [facility].   Evaluation Performed:  Follow-up visit  Date:  05/20/2018   ID:  Rhonda Acevedo, Rhonda Acevedo 1950-10-24, MRN [id number]  Patient Location: Home Provider Location: Office  PCP:  Wenda Low, MD  Cardiologist:  Satira Sark, MD  Chief Complaint:  Follow-up atrial fibrillation  History of Present Illness:    Rhonda Acevedo is a 68 y.o. female last seen in September 2019.  She did not have video access today and we spoke by phone.  She does not report any significant palpitations or chest pain since last encounter.  Reports NYHA class II dyspnea with basic activities.  She remains on Coumadin with follow-up per PCP.  Recent INR was 2.1.  She does not report any spontaneous bleeding problems.  She follows with Dr. Annamaria Boots for management of OSA on CPAP as well as asthma.  She states that her symptoms have been controlled on current medications.  No fevers or chills, no cough.  No recent hospitalizations.  I reviewed her recent lab work as outlined below.  The patient does not have symptoms concerning for COVID-19 infection (fever, chills, cough, or new shortness of breath).  She has been  social distancing, stays at home.  Granddaughter there being homeschooled as well.  Her children get her groceries.   Past Medical History:  Diagnosis Date  . Allergic rhinitis   . Esophageal reflux   . Essential hypertension, benign   . Factor V Leiden mutation ([place])   . GERD (gastroesophageal reflux disease)   . History of cardiac catheterization    Normal coronaries July 2015  . History of DVT (deep vein thrombosis)   . History of pulmonary embolism    Multiple, status post IVC filter, on Coumadin  . Mixed hyperlipidemia   . Obstructive sleep apnea    NPSG 05-29-07; AHI 32.9,cpap 7 to 9  . On home oxygen therapy    2 liters at night- usual- no recent changes  . Osteoarthritis   . Persistent atrial fibrillation    Documented October 2016  . [facility]fusion history    [facility]   Past Surgical History:  Procedure Laterality Date  . CHOLECYSTECTOMY    . COLONOSCOPY WITH PROPOFOL N/A 06/13/2015   Procedure: COLONOSCOPY WITH PROPOFOL;  Surgeon: Garlan Fair, MD;  Location: [facility] ENDOSCOPY;  Service: Endoscopy;  Laterality: N/A;  . ENDOMETRIAL ABLATION    . GANGLION CYST EXCISION    . Hernia abscess    . HERNIA REPAIR     umbilical  . I&D hernia incisional abscess    . IVC filter Right   . LEFT HEART CATHETERIZATION WITH CORONARY ANGIOGRAM N/A 08/20/2013   Procedure: LEFT HEART CATHETERIZATION WITH CORONARY ANGIOGRAM;  Surgeon: Blane Ohara, MD;  Location: [facility] LAB;  Service: Cardiovascular;  Laterality: N/A;  . ROTATOR CUFF REPAIR       Current Meds  Medication Sig  . Acetaminophen (TYLENOL ARTHRITIS PAIN PO) Take 2 tablets by mouth every 8 (eight) hours as needed (For pain.).   Marland Kitchen albuterol (ACCUNEB) 1.25 MG/3ML nebulizer solution Take 1 ampule by nebulization every 6 (six) hours as needed for wheezing or shortness of breath.   Marland Kitchen albuterol (PROAIR HFA) 108 (90 BASE) MCG/ACT inhaler Inhale 2 puffs into the lungs 4 (four)  times daily as needed for wheezing or shortness of breath.   . cetirizine (ZYRTEC) 10 MG tablet Take 10 mg by mouth daily.   Marland Kitchen diltiazem (CARDIZEM CD) 240 MG 24 hr capsule Take 1 capsule (240 mg total) by mouth daily.  . famotidine (PEPCID) 20 MG tablet Take 40 mg by mouth 2 (two) times daily.   . flecainide (TAMBOCOR) 50 MG tablet Take 1 tablet (50 mg total) by mouth 2 (two) times daily.  . furosemide (LASIX) 20 MG tablet Take 20 mg by mouth 2 (two) times daily.   Marland Kitchen oxybutynin (DITROPAN-XL) 10 MG 24 hr tablet Take by mouth.  . OXYGEN Place 3 L into the nose at bedtime.  . polyethylene glycol powder (GLYCOLAX/MIRALAX) powder Take 17 g by mouth at bedtime.   . pravastatin (PRAVACHOL) 80 MG tablet Take 80 mg by mouth at bedtime.   . valsartan (DIOVAN) 80 MG tablet Take 80 mg by mouth daily.   Marland Kitchen warfarin (COUMADIN) 10 MG tablet Take 10 mg by mouth See admin instructions. Take one tablet daily except none of Friday.     Allergies:   Adhesive [tape] and Benazepril hcl   Social History   Tobacco Use  . Smoking status: Never Smoker  . Smokeless tobacco: Never Used  . Tobacco comment: only as a teen for 6 months  Substance Use Topics  . Alcohol use: No    Alcohol/week: 0.0 standard drinks  . Drug use: No     Family Hx: The patient's family history includes Allergies in her unknown relative; Asthma in her unknown relative; COPD in her unknown relative; Cancer in her mother; Heart disease in her father.  ROS:   Please see the history of present illness.    All other systems reviewed and are negative.   Prior CV studies:   The following studies were reviewed today:  Echocardiogram 11/02/2014: Study Conclusions  - Left ventricle: The cavity size was normal. Wall thickness was   normal. Systolic function was normal. The estimated ejection   fraction was in the range of 55% to 60%. Wall motion was normal;   there were no regional wall motion abnormalities. - Left atrium: The atrium  was mildly to moderately dilated. - Right atrium: The atrium was mildly dilated.  CXR 03/17/2017: FINDINGS: Enlargement of cardiac silhouette.  Mediastinal contours and pulmonary vascularity normal.  Atherosclerotic calcification aorta.  Emphysematous and bronchitic changes consistent with COPD.  Subsegmental atelectasis at both lung bases.  Respiratory motion artifacts limit lateral view.  No gross acute infiltrate, pleural effusion or pneumothorax.  Bones appear demineralized.  IMPRESSION: COPD changes with bibasilar atelectasis.  Labs/Other Tests and Data Reviewed:    EKG:  An ECG dated 10/10/2017 was personally reviewed today and demonstrated:  Sinus rhythm with low voltage, poor R wave progression, IVCD.  Recent Labs:  April 2020: INR 2.22 March 2018: Hemoglobin A1c 5.9%, hemoglobin 12.5, platelets 256, BUN 23,  creatinine 0.84, potassium 4.2, AST 10, ALT 11, TSH 1.11, cholesterol 163, triglycerides 130, HDL 69, LDL 68  Wt Readings from Last 3 Encounters:  05/20/18 (!) 365 lb 9.6 oz (165.8 kg)  03/19/18 (!) 372 lb (168.7 kg)  10/10/17 (!) 376 lb (170.6 kg)     Objective:    Vital Signs:  BP 113/72   Pulse (!) 54   Ht 5\' 6"  (1.676 m)   Wt (!) 365 lb 9.6 oz (165.8 kg)   SpO2 96%   BMI 59.01 kg/m    Patient was not breathless speaking in full sentences on the phone.  Voice tone and speech pattern were normal.  No audible wheezing.  ASSESSMENT & PLAN:    1.  Paroxysmal atrial fibrillation.  She does not report any increase in palpitations on current medical regimen including Cardizem CD and flecainide.  Recent INR therapeutic on Coumadin, followed by PCP.  No changes were made today.  She will need a follow-up ECG for her next office visit.  2.  History of symptomatic bradycardia.  Cardizem CD was reduced at the last visit.  Heart rate today in the mid 50s, no obvious limitations.  Continue with same for now.  3.  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  She is on long-term Coumadin with follow-up by PCP.  4.  Essential hypertension, blood pressure is normal today.  COVID-19 Education: The signs and symptoms of COVID-19 were discussed with the patient and how to seek care for testing (follow up with PCP or arrange E-visit).  The importance of social distancing was discussed today.  Time:   Today, I have spent 5 minutes with the patient with telehealth technology discussing the above problems.     Medication Adjustments/Labs and Tests Ordered: Current medicines are reviewed at length with the patient today.  Concerns regarding medicines are outlined above.   Tests Ordered: No orders of the defined types were placed in this encounter.   Medication Changes: No orders of the defined types were placed in this encounter.   Disposition:  Follow up 6 months in the [place] office.  Signed, Rozann Lesches, MD  05/20/2018 1:03 PM    [place]

## 2018-05-20 ENCOUNTER — Telehealth (INDEPENDENT_AMBULATORY_CARE_PROVIDER_SITE_OTHER): Payer: PPO | Admitting: Cardiology

## 2018-05-20 ENCOUNTER — Encounter: Payer: Self-pay | Admitting: Cardiology

## 2018-05-20 VITALS — BP 113/72 | HR 54 | Ht 66.0 in | Wt 365.6 lb

## 2018-05-20 DIAGNOSIS — I10 Essential (primary) hypertension: Secondary | ICD-10-CM | POA: Diagnosis not present

## 2018-05-20 DIAGNOSIS — D6859 Other primary thrombophilia: Secondary | ICD-10-CM | POA: Diagnosis not present

## 2018-05-20 DIAGNOSIS — I480 Paroxysmal atrial fibrillation: Secondary | ICD-10-CM | POA: Diagnosis not present

## 2018-05-20 DIAGNOSIS — Z7189 Other specified counseling: Secondary | ICD-10-CM

## 2018-05-20 NOTE — Patient Instructions (Signed)
Medication Instructions: Your physician recommends that you continue on your current medications as directed. Please refer to the Current Medication list given to you today.   Labwork: None today  Procedures/Testing: None today  Follow-Up: 6 months with Dr.McDowell  Any Additional Special Instructions Will Be Listed Below (If Applicable).     If you need a refill on your cardiac medications before your next appointment, please call your pharmacy.      Thank you for choosing [facility] !        

## 2018-06-02 DIAGNOSIS — Z7901 Long term (current) use of anticoagulants: Secondary | ICD-10-CM | POA: Diagnosis not present

## 2018-06-09 DIAGNOSIS — Z7901 Long term (current) use of anticoagulants: Secondary | ICD-10-CM | POA: Diagnosis not present

## 2018-06-26 DIAGNOSIS — M1712 Unilateral primary osteoarthritis, left knee: Secondary | ICD-10-CM | POA: Diagnosis not present

## 2018-06-26 DIAGNOSIS — M1711 Unilateral primary osteoarthritis, right knee: Secondary | ICD-10-CM | POA: Diagnosis not present

## 2018-07-03 DIAGNOSIS — M1711 Unilateral primary osteoarthritis, right knee: Secondary | ICD-10-CM | POA: Diagnosis not present

## 2018-07-07 ENCOUNTER — Other Ambulatory Visit: Payer: Self-pay | Admitting: Cardiology

## 2018-07-07 DIAGNOSIS — Z7901 Long term (current) use of anticoagulants: Secondary | ICD-10-CM | POA: Diagnosis not present

## 2018-08-04 DIAGNOSIS — Z7901 Long term (current) use of anticoagulants: Secondary | ICD-10-CM | POA: Diagnosis not present

## 2018-08-10 ENCOUNTER — Other Ambulatory Visit: Payer: Self-pay | Admitting: Cardiology

## 2018-08-13 DIAGNOSIS — Z6841 Body Mass Index (BMI) 40.0 and over, adult: Secondary | ICD-10-CM | POA: Diagnosis not present

## 2018-08-13 DIAGNOSIS — E669 Obesity, unspecified: Secondary | ICD-10-CM | POA: Diagnosis not present

## 2018-08-13 DIAGNOSIS — R140 Abdominal distension (gaseous): Secondary | ICD-10-CM | POA: Diagnosis not present

## 2018-09-01 DIAGNOSIS — Z7901 Long term (current) use of anticoagulants: Secondary | ICD-10-CM | POA: Diagnosis not present

## 2018-09-01 DIAGNOSIS — J45909 Unspecified asthma, uncomplicated: Secondary | ICD-10-CM | POA: Diagnosis not present

## 2018-09-01 DIAGNOSIS — G4733 Obstructive sleep apnea (adult) (pediatric): Secondary | ICD-10-CM | POA: Diagnosis not present

## 2018-09-01 DIAGNOSIS — I509 Heart failure, unspecified: Secondary | ICD-10-CM | POA: Diagnosis not present

## 2018-09-01 DIAGNOSIS — I5032 Chronic diastolic (congestive) heart failure: Secondary | ICD-10-CM | POA: Diagnosis not present

## 2018-09-16 DIAGNOSIS — Z7901 Long term (current) use of anticoagulants: Secondary | ICD-10-CM | POA: Diagnosis not present

## 2018-09-21 DIAGNOSIS — Z6841 Body Mass Index (BMI) 40.0 and over, adult: Secondary | ICD-10-CM | POA: Diagnosis not present

## 2018-09-21 DIAGNOSIS — M1712 Unilateral primary osteoarthritis, left knee: Secondary | ICD-10-CM | POA: Diagnosis not present

## 2018-09-21 DIAGNOSIS — M1711 Unilateral primary osteoarthritis, right knee: Secondary | ICD-10-CM | POA: Diagnosis not present

## 2018-09-23 DIAGNOSIS — Z1231 Encounter for screening mammogram for malignant neoplasm of breast: Secondary | ICD-10-CM | POA: Diagnosis not present

## 2018-09-23 DIAGNOSIS — Z803 Family history of malignant neoplasm of breast: Secondary | ICD-10-CM | POA: Diagnosis not present

## 2018-10-02 DIAGNOSIS — I509 Heart failure, unspecified: Secondary | ICD-10-CM | POA: Diagnosis not present

## 2018-10-02 DIAGNOSIS — J45909 Unspecified asthma, uncomplicated: Secondary | ICD-10-CM | POA: Diagnosis not present

## 2018-10-02 DIAGNOSIS — I5032 Chronic diastolic (congestive) heart failure: Secondary | ICD-10-CM | POA: Diagnosis not present

## 2018-10-02 DIAGNOSIS — G4733 Obstructive sleep apnea (adult) (pediatric): Secondary | ICD-10-CM | POA: Diagnosis not present

## 2018-10-05 ENCOUNTER — Other Ambulatory Visit: Payer: Self-pay | Admitting: Cardiology

## 2018-10-05 DIAGNOSIS — I2699 Other pulmonary embolism without acute cor pulmonale: Secondary | ICD-10-CM | POA: Diagnosis not present

## 2018-10-05 DIAGNOSIS — I4891 Unspecified atrial fibrillation: Secondary | ICD-10-CM | POA: Diagnosis not present

## 2018-10-05 DIAGNOSIS — G4733 Obstructive sleep apnea (adult) (pediatric): Secondary | ICD-10-CM | POA: Diagnosis not present

## 2018-10-05 DIAGNOSIS — R7303 Prediabetes: Secondary | ICD-10-CM | POA: Diagnosis not present

## 2018-10-05 DIAGNOSIS — G8929 Other chronic pain: Secondary | ICD-10-CM | POA: Diagnosis not present

## 2018-10-05 DIAGNOSIS — Z23 Encounter for immunization: Secondary | ICD-10-CM | POA: Diagnosis not present

## 2018-10-05 DIAGNOSIS — Z6841 Body Mass Index (BMI) 40.0 and over, adult: Secondary | ICD-10-CM | POA: Diagnosis not present

## 2018-10-05 DIAGNOSIS — I5032 Chronic diastolic (congestive) heart failure: Secondary | ICD-10-CM | POA: Diagnosis not present

## 2018-10-05 DIAGNOSIS — I10 Essential (primary) hypertension: Secondary | ICD-10-CM | POA: Diagnosis not present

## 2018-10-05 DIAGNOSIS — M1990 Unspecified osteoarthritis, unspecified site: Secondary | ICD-10-CM | POA: Diagnosis not present

## 2018-10-05 DIAGNOSIS — J4540 Moderate persistent asthma, uncomplicated: Secondary | ICD-10-CM | POA: Diagnosis not present

## 2018-10-14 DIAGNOSIS — Z7901 Long term (current) use of anticoagulants: Secondary | ICD-10-CM | POA: Diagnosis not present

## 2018-11-01 DIAGNOSIS — G4733 Obstructive sleep apnea (adult) (pediatric): Secondary | ICD-10-CM | POA: Diagnosis not present

## 2018-11-01 DIAGNOSIS — I5032 Chronic diastolic (congestive) heart failure: Secondary | ICD-10-CM | POA: Diagnosis not present

## 2018-11-01 DIAGNOSIS — J45909 Unspecified asthma, uncomplicated: Secondary | ICD-10-CM | POA: Diagnosis not present

## 2018-11-01 DIAGNOSIS — I509 Heart failure, unspecified: Secondary | ICD-10-CM | POA: Diagnosis not present

## 2018-11-17 DIAGNOSIS — Z7901 Long term (current) use of anticoagulants: Secondary | ICD-10-CM | POA: Diagnosis not present

## 2018-11-17 DIAGNOSIS — L729 Follicular cyst of the skin and subcutaneous tissue, unspecified: Secondary | ICD-10-CM | POA: Diagnosis not present

## 2018-12-02 DIAGNOSIS — J45909 Unspecified asthma, uncomplicated: Secondary | ICD-10-CM | POA: Diagnosis not present

## 2018-12-02 DIAGNOSIS — G4733 Obstructive sleep apnea (adult) (pediatric): Secondary | ICD-10-CM | POA: Diagnosis not present

## 2018-12-02 DIAGNOSIS — I5032 Chronic diastolic (congestive) heart failure: Secondary | ICD-10-CM | POA: Diagnosis not present

## 2018-12-02 DIAGNOSIS — I509 Heart failure, unspecified: Secondary | ICD-10-CM | POA: Diagnosis not present

## 2018-12-15 DIAGNOSIS — Z7901 Long term (current) use of anticoagulants: Secondary | ICD-10-CM | POA: Diagnosis not present

## 2019-01-01 DIAGNOSIS — J45909 Unspecified asthma, uncomplicated: Secondary | ICD-10-CM | POA: Diagnosis not present

## 2019-01-01 DIAGNOSIS — G4733 Obstructive sleep apnea (adult) (pediatric): Secondary | ICD-10-CM | POA: Diagnosis not present

## 2019-01-01 DIAGNOSIS — I509 Heart failure, unspecified: Secondary | ICD-10-CM | POA: Diagnosis not present

## 2019-01-01 DIAGNOSIS — I5032 Chronic diastolic (congestive) heart failure: Secondary | ICD-10-CM | POA: Diagnosis not present

## 2019-01-06 ENCOUNTER — Ambulatory Visit: Payer: PPO | Admitting: Cardiology

## 2019-01-06 ENCOUNTER — Other Ambulatory Visit: Payer: Self-pay

## 2019-01-06 ENCOUNTER — Encounter: Payer: Self-pay | Admitting: Cardiology

## 2019-01-06 VITALS — BP 144/74 | HR 67 | Temp 95.9°F | Ht 66.5 in | Wt 364.0 lb

## 2019-01-06 DIAGNOSIS — I10 Essential (primary) hypertension: Secondary | ICD-10-CM | POA: Diagnosis not present

## 2019-01-06 DIAGNOSIS — I480 Paroxysmal atrial fibrillation: Secondary | ICD-10-CM | POA: Diagnosis not present

## 2019-01-06 DIAGNOSIS — D6859 Other primary thrombophilia: Secondary | ICD-10-CM | POA: Diagnosis not present

## 2019-01-06 NOTE — Progress Notes (Signed)
Cardiology Office Note  Date: 01/06/2019   ID: Rhonda Acevedo, DOB 12/23/1950, MRN [id number]  PCP:  Wenda Low, MD  Cardiologist: Satira Sark, MD Electrophysiologist:  None   Chief Complaint  Patient presents with  . Cardiac follow-up    History of Present Illness: Rhonda Acevedo is a 68 y.o. female last assessed via telehealth encounter in April.  She presents for a routine visit.  Since last evaluation she does not report any progressive sense of palpitations or chest pain.  She has been staying around her home mostly during the pandemic, does wear a mask when she goes out.  She remains on Coumadin with follow-up by PCP.  Recent reported INR was 2.5.  She does not report any spontaneous bleeding problems.  She has continued on flecainide and Cartia XT with overall good control of paroxysmal atrial fibrillation.  I personally reviewed her ECG today which shows sinus rhythm with low voltage and nonspecific T wave changes.  Past Medical History:  Diagnosis Date  . Allergic rhinitis   . Esophageal reflux   . Essential hypertension   . Factor V Leiden mutation ([place])   . GERD (gastroesophageal reflux disease)   . History of cardiac catheterization    Normal coronaries July 2015  . History of DVT (deep vein thrombosis)   . History of pulmonary embolism    Multiple, status post IVC filter, on Coumadin  . Mixed hyperlipidemia   . Obstructive sleep apnea    NPSG 05-29-07; AHI 32.9,cpap 7 to 9  . On home oxygen therapy    2 liters at night- usual- no recent changes  . Osteoarthritis   . Persistent atrial fibrillation [facility])    Documented October 2016  . Transfusion history    [facility]    Past Surgical History:  Procedure Laterality Date  . CHOLECYSTECTOMY    . COLONOSCOPY WITH PROPOFOL N/A 06/13/2015   Procedure: COLONOSCOPY WITH PROPOFOL;  Surgeon: Garlan Fair, MD;  Location: [facility] ENDOSCOPY;  Service:  Endoscopy;  Laterality: N/A;  . ENDOMETRIAL ABLATION    . GANGLION CYST EXCISION    . Hernia abscess    . HERNIA REPAIR     umbilical  . I&D hernia incisional abscess    . IVC filter Right   . LEFT HEART CATHETERIZATION WITH CORONARY ANGIOGRAM N/A 08/20/2013   Procedure: LEFT HEART CATHETERIZATION WITH CORONARY ANGIOGRAM;  Surgeon: Blane Ohara, MD;  Location: [facility] CATH LAB;  Service: Cardiovascular;  Laterality: N/A;  . ROTATOR CUFF REPAIR      Current Outpatient Medications  Medication Sig Dispense Refill  . albuterol (ACCUNEB) 1.25 MG/3ML nebulizer solution Take 1 ampule by nebulization every 6 (six) hours as needed for wheezing or shortness of breath.     Marland Kitchen albuterol (PROAIR HFA) 108 (90 BASE) MCG/ACT inhaler Inhale 2 puffs into the lungs 4 (four) times daily as needed for wheezing or shortness of breath.     Geronimo Boot XT 240 MG 24 hr capsule Take 1 capsule by mouth once daily 90 capsule 3  . cetirizine (ZYRTEC) 10 MG tablet Take 10 mg by mouth daily.     . famotidine (PEPCID) 20 MG tablet Take 40 mg by mouth 2 (two) times daily.     . flecainide (TAMBOCOR) 50 MG tablet Take 1 tablet by mouth twice daily 180 tablet 3  . furosemide (LASIX) 20 MG tablet Take 20 mg by mouth 2 (two) times daily.     Marland Kitchen  oxybutynin (DITROPAN-XL) 10 MG 24 hr tablet Take by mouth.    . OXYGEN Place 3 L into the nose at bedtime.    . polyethylene glycol powder (GLYCOLAX/MIRALAX) powder Take 17 g by mouth at bedtime.     . pravastatin (PRAVACHOL) 80 MG tablet Take 80 mg by mouth at bedtime.     . traMADol (ULTRAM) 50 MG tablet Take 50 mg by mouth 3 (three) times daily as needed.    . valsartan (DIOVAN) 80 MG tablet Take 80 mg by mouth daily.     Marland Kitchen warfarin (COUMADIN) 10 MG tablet Take 10 mg by mouth See admin instructions. Take one tablet daily except none of Friday.     No current facility-administered medications for this visit.   Allergies:  Adhesive [tape] and Benazepril hcl   Social History: The  patient  reports that she has never smoked. She has never used smokeless tobacco. She reports that she does not drink alcohol or use drugs.   ROS:  Please see the history of present illness. Otherwise, complete review of systems is positive for none.  All other systems are reviewed and negative.   Physical Exam: VS:  BP (!) 144/74   Pulse 67   Temp (!) 95.9 F (35.5 C)   Ht 5' 6.5" (1.689 m)   Wt (!) 364 lb (165.1 kg)   SpO2 97%   BMI 57.87 kg/m , BMI Body mass index is 57.87 kg/m.  Wt Readings from Last 3 Encounters:  01/06/19 (!) 364 lb (165.1 kg)  05/20/18 (!) 365 lb 9.6 oz (165.8 kg)  03/19/18 (!) 372 lb (168.7 kg)    General: Patient appears comfortable at rest. HEENT: Conjunctiva and lids normal, wearing a mask. Neck: Supple, no elevated JVP or carotid bruits, no thyromegaly. Lungs: Decreased breath sounds without wheezing, nonlabored breathing at rest. Cardiac: Regular rate and rhythm, no S3 or significant systolic murmur. Abdomen: Morbidly obese with pannus, nontender, bowel sounds present. Extremities: Chronic appearing edema and adipose tissue, distal pulses 2+.  ECG:  An ECG dated 10/10/2017 was personally reviewed today and demonstrated:  Sinus bradycardia with IVCD, low voltage, decreased R wave progression.  Recent Labwork:  Requesting interval lab work from PCP.  Other Studies Reviewed Today:  Echocardiogram 11/02/2014: Study Conclusions  - Left ventricle: The cavity size was normal. Wall thickness was normal. Systolic function was normal. The estimated ejection fraction was in the range of 55% to 60%. Wall motion was normal; there were no regional wall motion abnormalities. - Left atrium: The atrium was mildly to moderately dilated. - Right atrium: The atrium was mildly dilated.  Assessment and Plan:  1.  Paroxysmal atrial fibrillation.  Symptomatically well controlled on flecainide and Cartia XT.  She is on Coumadin with follow-up by PCP.  2.   Hypercoagulable state with factor V Leiden mutation and history of recurrent DVTs and pulmonary embolus.  She remains on lifelong Coumadin with follow-up by PCP.  3.  Essential hypertension, no changes made to present regimen.  Medication Adjustments/Labs and Tests Ordered: Current medicines are reviewed at length with the patient today.  Concerns regarding medicines are outlined above.   Tests Ordered: Orders Placed This Encounter  Procedures  . EKG 12-Lead    Medication Changes: No orders of the defined types were placed in this encounter.   Disposition:  Follow up 6 months in the [place] office.  Signed, Satira Sark, MD, [facility] 01/06/2019 3:20 PM    [place]  at [place]. [address], [place], [place] [address] Phone: [phone]; Fax: [phone]

## 2019-01-06 NOTE — Patient Instructions (Signed)

## 2019-01-26 DIAGNOSIS — M1711 Unilateral primary osteoarthritis, right knee: Secondary | ICD-10-CM | POA: Diagnosis not present

## 2019-01-26 DIAGNOSIS — M1712 Unilateral primary osteoarthritis, left knee: Secondary | ICD-10-CM | POA: Diagnosis not present

## 2019-01-26 DIAGNOSIS — Z6841 Body Mass Index (BMI) 40.0 and over, adult: Secondary | ICD-10-CM | POA: Diagnosis not present

## 2019-01-26 DIAGNOSIS — M25561 Pain in right knee: Secondary | ICD-10-CM | POA: Diagnosis not present

## 2019-01-26 DIAGNOSIS — M25562 Pain in left knee: Secondary | ICD-10-CM | POA: Diagnosis not present

## 2019-02-01 DIAGNOSIS — G4733 Obstructive sleep apnea (adult) (pediatric): Secondary | ICD-10-CM | POA: Diagnosis not present

## 2019-02-01 DIAGNOSIS — I509 Heart failure, unspecified: Secondary | ICD-10-CM | POA: Diagnosis not present

## 2019-02-01 DIAGNOSIS — J45909 Unspecified asthma, uncomplicated: Secondary | ICD-10-CM | POA: Diagnosis not present

## 2019-02-01 DIAGNOSIS — I5032 Chronic diastolic (congestive) heart failure: Secondary | ICD-10-CM | POA: Diagnosis not present

## 2019-02-09 DIAGNOSIS — Z7901 Long term (current) use of anticoagulants: Secondary | ICD-10-CM | POA: Diagnosis not present

## 2019-03-04 DIAGNOSIS — G4733 Obstructive sleep apnea (adult) (pediatric): Secondary | ICD-10-CM | POA: Diagnosis not present

## 2019-03-04 DIAGNOSIS — I509 Heart failure, unspecified: Secondary | ICD-10-CM | POA: Diagnosis not present

## 2019-03-04 DIAGNOSIS — I5032 Chronic diastolic (congestive) heart failure: Secondary | ICD-10-CM | POA: Diagnosis not present

## 2019-03-04 DIAGNOSIS — J45909 Unspecified asthma, uncomplicated: Secondary | ICD-10-CM | POA: Diagnosis not present

## 2019-04-07 DIAGNOSIS — Z1389 Encounter for screening for other disorder: Secondary | ICD-10-CM | POA: Diagnosis not present

## 2019-04-07 DIAGNOSIS — G4733 Obstructive sleep apnea (adult) (pediatric): Secondary | ICD-10-CM | POA: Diagnosis not present

## 2019-04-07 DIAGNOSIS — I4891 Unspecified atrial fibrillation: Secondary | ICD-10-CM | POA: Diagnosis not present

## 2019-04-07 DIAGNOSIS — D6869 Other thrombophilia: Secondary | ICD-10-CM | POA: Diagnosis not present

## 2019-04-07 DIAGNOSIS — R7309 Other abnormal glucose: Secondary | ICD-10-CM | POA: Diagnosis not present

## 2019-04-07 DIAGNOSIS — D510 Vitamin B12 deficiency anemia due to intrinsic factor deficiency: Secondary | ICD-10-CM | POA: Diagnosis not present

## 2019-04-07 DIAGNOSIS — Z7901 Long term (current) use of anticoagulants: Secondary | ICD-10-CM | POA: Diagnosis not present

## 2019-04-07 DIAGNOSIS — Z0000 Encounter for general adult medical examination without abnormal findings: Secondary | ICD-10-CM | POA: Diagnosis not present

## 2019-04-07 DIAGNOSIS — J4540 Moderate persistent asthma, uncomplicated: Secondary | ICD-10-CM | POA: Diagnosis not present

## 2019-04-07 DIAGNOSIS — I2699 Other pulmonary embolism without acute cor pulmonale: Secondary | ICD-10-CM | POA: Diagnosis not present

## 2019-04-07 DIAGNOSIS — I10 Essential (primary) hypertension: Secondary | ICD-10-CM | POA: Diagnosis not present

## 2019-04-07 DIAGNOSIS — M1990 Unspecified osteoarthritis, unspecified site: Secondary | ICD-10-CM | POA: Diagnosis not present

## 2019-04-07 DIAGNOSIS — E7800 Pure hypercholesterolemia, unspecified: Secondary | ICD-10-CM | POA: Diagnosis not present

## 2019-04-08 DIAGNOSIS — M1711 Unilateral primary osteoarthritis, right knee: Secondary | ICD-10-CM | POA: Diagnosis not present

## 2019-04-08 DIAGNOSIS — Z6841 Body Mass Index (BMI) 40.0 and over, adult: Secondary | ICD-10-CM | POA: Diagnosis not present

## 2019-04-08 DIAGNOSIS — M1712 Unilateral primary osteoarthritis, left knee: Secondary | ICD-10-CM | POA: Diagnosis not present

## 2019-04-15 DIAGNOSIS — M1712 Unilateral primary osteoarthritis, left knee: Secondary | ICD-10-CM | POA: Diagnosis not present

## 2019-04-15 DIAGNOSIS — M1711 Unilateral primary osteoarthritis, right knee: Secondary | ICD-10-CM | POA: Diagnosis not present

## 2019-04-19 ENCOUNTER — Encounter: Payer: Self-pay | Admitting: Internal Medicine

## 2019-04-19 ENCOUNTER — Other Ambulatory Visit: Payer: Self-pay

## 2019-04-19 ENCOUNTER — Ambulatory Visit: Payer: PPO | Admitting: Internal Medicine

## 2019-04-19 DIAGNOSIS — G4733 Obstructive sleep apnea (adult) (pediatric): Secondary | ICD-10-CM | POA: Diagnosis not present

## 2019-04-19 DIAGNOSIS — J9611 Chronic respiratory failure with hypoxia: Secondary | ICD-10-CM

## 2019-04-19 NOTE — Progress Notes (Signed)
HPI F followed for OSA, OHS, morbid obesity, chronic hypoxic respiratory failure, complicated by hx DVT/ PE/warfarin, asthma/ bronchitis, GERD PFT 04/12/2011-normal spirometry flows within significant response to bronchodilator. FEV1/FVC 0.79. Mild restriction and moderate diffusion reduction both may be do to her obesity. PFT: 02/10/2014-obstructive and restrictive changes and reduction of diffusion best explained by her obesity/hypoventilation syndrome ----------------------------------------------------------------------  03/19/2018- 69 year old female never smoker followed for OSA, OHS, chronic hypoxic respiratory failure, morbid obesity, complicated by history DVT/PE/warfarin, asthma/bronchitis, GERD, P A. fib CPAP auto 4-12 and oxygen 3 L sleep and portable/ [employer] -----Pt states she has been doing well since last visit and denies any complaints. Body weight today 372 lbs Download 100% compliance, AHI 0.3/ hr Neb albuterol, ProAirHFA, Advair 500,  Has gone through this winter without catching flu.  Breathing is good.  She has not used any of her respiratory medicines since March 29-not needed and without routine cough or wheeze.  Only uses oxygen at night.  Tried 2 L but started having headaches, resolved by returning to 3 L during sleep.  04/19/19- 69 year old female never smoker followed for OSA, OHS, chronic hypoxic respiratory failure, morbid obesity, complicated by history DVT/PE/warfarin, asthma/bronchitis, GERD, P A. Fib ----- Chronic respiratory failure with hypoxia/OSA O2 3l at betime   Body weight today 357 CPAP auto 4-12 and O2 3L sleep/ [employer] Download compliance 100%, AHI 0.4/ hr Feels very comfortable with her CPAP and denies concerns. Does not want Covid vaccine 'because I don't want it", but dislikes wearing mask.  Not needing any inhalers at all, with no wheeze or cough. Knees hurt.  ROS-see HPI  + = positive Constitutional:   No-   weight loss, night sweats, fevers,  chills, fatigue, lassitude. HEENT:   No-  headaches, difficulty swallowing, tooth/dental problems, sore throat,       No-  sneezing, itching, ear ache, nasal congestion, post nasal drip,  CV:  No-   chest pain, orthopnea, PND, swelling in lower extremities, anasarca,  dizziness, palpitations Resp: + shortness of breath with exertion or at rest.                productive cough,  non-productive cough,  No- coughing up of blood.               change in color of mucus.  No- wheezing.   Skin: No-   rash or lesions. GI:  No-   heartburn, indigestion, abdominal pain, nausea, vomiting,  GU:  MS:  No-   joint pain or swelling.   Neuro-     nothing unusual Psych:  No- change in mood or affect. No depression or anxiety.  No memory loss.  OBJ- Physical Exam General- Alert, Oriented, Affect-appropriate, Distress- none acute. +Morbid obesity.  Skin- rash-none, lesions- none, excoriation- none Lymphadenopathy- none Head- atraumatic            Eyes- Gross vision intact, PERRLA, conjunctivae and secretions clear            Ears- Hearing, canals-normal            Nose- Clear, no-Septal dev, mucus, polyps, erosion, perforation             Throat- Mallampati II-III , mucosa clear , drainage- none, tonsils- atrophic. No-hoarseness, throat clearing Neck- flexible , trachea midline, no stridor , thyroid nl, carotid no bruit Chest - symmetrical excursion , unlabored           Heart/CV- RRR , no murmur , no gallop  , no  rub, nl s1 s2                           - JVD- none , edema-+1, stasis changes+chronic, varices- none           Lung- clear to P&A, wheeze- none, cough- none, dullness-none, rub- none. Shallow c/w habitus           Chest wall-  Abd- Br/ Gen/ Rectal- Not done, not indicated Extrem- cyanosis- none, clubbing, none, atrophy- none, strength- nl Neuro- grossly intact to observation

## 2019-04-19 NOTE — Patient Instructions (Signed)
We can continue CPAP auto 4-12 with o2 3L for sleep  Please cal if we can help

## 2019-04-22 DIAGNOSIS — M1711 Unilateral primary osteoarthritis, right knee: Secondary | ICD-10-CM | POA: Diagnosis not present

## 2019-04-22 DIAGNOSIS — M25561 Pain in right knee: Secondary | ICD-10-CM | POA: Diagnosis not present

## 2019-04-22 DIAGNOSIS — M25562 Pain in left knee: Secondary | ICD-10-CM | POA: Diagnosis not present

## 2019-04-22 DIAGNOSIS — M1712 Unilateral primary osteoarthritis, left knee: Secondary | ICD-10-CM | POA: Diagnosis not present

## 2019-04-28 ENCOUNTER — Encounter: Payer: Self-pay | Admitting: Internal Medicine

## 2019-04-28 NOTE — Assessment & Plan Note (Signed)
Key problem is obesity hypoventilation, with hx PE. She remains O2 dependent but stable on 3L Plan- no change

## 2019-04-28 NOTE — Assessment & Plan Note (Signed)
I encourage weight loss and diet effort, but she hasn't changed in all the years I have worked with her.

## 2019-04-28 NOTE — Assessment & Plan Note (Signed)
She continues to depend on CPAP and benefits with good compliance and control. Plan- continue auto 4-12

## 2019-05-04 DIAGNOSIS — Z961 Presence of intraocular lens: Secondary | ICD-10-CM | POA: Diagnosis not present

## 2019-05-04 DIAGNOSIS — H52203 Unspecified astigmatism, bilateral: Secondary | ICD-10-CM | POA: Diagnosis not present

## 2019-05-04 DIAGNOSIS — H26492 Other secondary cataract, left eye: Secondary | ICD-10-CM | POA: Diagnosis not present

## 2019-05-04 DIAGNOSIS — H35371 Puckering of macula, right eye: Secondary | ICD-10-CM | POA: Diagnosis not present

## 2019-05-07 ENCOUNTER — Other Ambulatory Visit: Payer: Self-pay | Admitting: Orthopedic Surgery

## 2019-05-07 DIAGNOSIS — M25569 Pain in unspecified knee: Secondary | ICD-10-CM

## 2019-05-10 ENCOUNTER — Other Ambulatory Visit: Payer: Self-pay

## 2019-05-10 ENCOUNTER — Ambulatory Visit
Admission: RE | Admit: 2019-05-10 | Discharge: 2019-05-10 | Disposition: A | Discharge status: Home / Self Care | Payer: PPO | Source: Ambulatory Visit | Attending: Orthopedic Surgery | Admitting: Orthopedic Surgery

## 2019-05-10 ENCOUNTER — Other Ambulatory Visit (HOSPITAL_COMMUNITY): Payer: Self-pay | Admitting: Interventional Radiology

## 2019-05-10 ENCOUNTER — Encounter: Payer: Self-pay | Admitting: *Deleted

## 2019-05-10 DIAGNOSIS — Z9289 Personal history of other medical treatment: Secondary | ICD-10-CM | POA: Diagnosis not present

## 2019-05-10 DIAGNOSIS — G8929 Other chronic pain: Secondary | ICD-10-CM

## 2019-05-10 DIAGNOSIS — M25562 Pain in left knee: Secondary | ICD-10-CM

## 2019-05-10 DIAGNOSIS — M25561 Pain in right knee: Secondary | ICD-10-CM | POA: Diagnosis not present

## 2019-05-10 DIAGNOSIS — M25569 Pain in unspecified knee: Secondary | ICD-10-CM

## 2019-05-10 HISTORY — PX: IR RADIOLOGIST EVAL & MGMT: IMG5224

## 2019-05-10 NOTE — Consult Note (Signed)
Chief Complaint: Knee pain  Referring Physician(s): Graves,Lional Icenogle  History of Present Illness: Rhonda Acevedo is a 69 y.o. 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  Patient reports a long history of knee pain lasting at least the past 15 to 20 years.  She reports a previous orthoscopic surgery on her left knee greater than 10 years ago.  She has had multiple injections in both knees.  Patient states her right-sided knee pain is primarily posterior, while the left-sided knee pain is primarily anterior.  Patient has been ambulating with a walker since January due to her persistent knee pain.  Patient has been evaluated for a total knee replacement though has been deemed a poor operative candidate given her elevated BMI and multiple medical comorbidities.   Past Medical History:  Diagnosis Date  . Allergic rhinitis   . Esophageal reflux   . Essential hypertension   . Factor V Leiden mutation ([place])   . GERD (gastroesophageal reflux disease)   . History of cardiac catheterization    Normal coronaries July 2015  . History of DVT (deep vein thrombosis)   . History of pulmonary embolism    Multiple, status post IVC filter, on Coumadin  . Mixed hyperlipidemia   . Obstructive sleep apnea    NPSG 05-29-07; AHI 32.9,cpap 7 to 9  . On home oxygen therapy    2 liters at night- usual- no recent changes  . Osteoarthritis   . Persistent atrial fibrillation [facility])    Documented October 2016  . Transfusion history    [facility]    Past Surgical History:  Procedure Laterality Date  . CHOLECYSTECTOMY    . COLONOSCOPY WITH PROPOFOL N/A 06/13/2015   Procedure: COLONOSCOPY WITH  PROPOFOL;  Surgeon: Garlan Fair, MD;  Location: [facility];  Service: Endoscopy;  Laterality: N/A;  . ENDOMETRIAL ABLATION    . GANGLION CYST EXCISION    . Hernia abscess    . HERNIA REPAIR     umbilical  . I&D hernia incisional abscess    . IVC filter Right   . LEFT HEART CATHETERIZATION WITH CORONARY ANGIOGRAM N/A 08/20/2013   Procedure: LEFT HEART CATHETERIZATION WITH CORONARY ANGIOGRAM;  Surgeon: Blane Ohara, MD;  Location: [facility] C[facility] LAB;  Service: Cardiovascular;  Laterality: N/A;  . ROTATOR CUFF REPAIR      Allergies: Adhesive [tape], Sulfa antibiotics, and Benazepril hcl  Medications: Prior to Admission medications   Medication Sig Start Date End Date Taking? Authorizing Provider  albuterol (ACCUNEB) 1.25 MG/3ML nebulizer solution Take 1 ampule by nebulization every 6 (six) hours as needed for wheezing or shortness of breath.     [provider]  albuterol (PROAIR HFA) 108 (90 BASE) MCG/ACT inhaler Inhale 2 puffs into the lungs 4 (four) times daily as needed for wheezing or shortness of breath.     [provider]  CARTIA XT 240 MG 24 hr capsule Take 1 capsule by mouth once daily 10/05/18   Satira Sark, MD  cetirizine (ZYRTEC) 10 MG tablet Take 10 mg by mouth daily.  10/29/13   [provider]  famotidine (PEPCID) 20 MG tablet Take 40 mg by mouth 2 (two) times daily.     [provider]  flecainide (  TAMBOCOR) 50 MG tablet Take 1 tablet by mouth twice daily 10/05/18   Satira Sark, MD  furosemide (LASIX) 20 MG tablet Take 20 mg by mouth 2 (two) times daily.     [provider]  oxybutynin (DITROPAN-XL) 10 MG 24 hr tablet Take by mouth. 02/03/18   [provider]  OXYGEN Place 3 L into the nose at bedtime.    [provider]  polyethylene glycol powder (GLYCOLAX/MIRALAX) powder Take 17 g by mouth at bedtime.  11/04/11   [provider]  pravastatin (PRAVACHOL) 80 MG tablet Take 80 mg by mouth  at bedtime.     [provider]  traMADol (ULTRAM) 50 MG tablet Take 50 mg by mouth 3 (three) times daily as needed. 01/03/19   [provider]  valsartan (DIOVAN) 80 MG tablet Take 80 mg by mouth daily.     [provider]  warfarin (COUMADIN) 10 MG tablet Take 10 mg by mouth See admin instructions. Take one tablet daily except none of Friday.    [provider]     Family History  Problem Relation Age of Onset  . Cancer Mother        Thyroid cancer  . Heart disease Father        Heart attack  . Allergies Other   . Asthma Other   . COPD Other     Social History   Socioeconomic History  . Marital status: Married    Spouse name: Not on file  . Number of children: Not on file  . Years of education: Not on file  . Highest education level: Not on file  Occupational History  . Occupation: Disability  Tobacco Use  . Smoking status: Never Smoker  . Smokeless tobacco: Never Used  . Tobacco comment: only as a teen for 6 months  Substance and Sexual Activity  . Alcohol use: No    Alcohol/week: 0.0 standard drinks  . Drug use: No  . Sexual activity: Not on file  Other Topics Concern  . Not on file  Social History Narrative   Widowed-cirrhosis   Social Determinants of Health   Financial Resource Strain:   . Difficulty of Paying Living Expenses:   Food Insecurity:   . Worried About Charity fundraiser in the Last Year:   . Arboriculturist in the Last Year:   Transportation Needs:   . Film/video editor (Medical):   Marland Kitchen Lack of Transportation (Non-Medical):   Physical Activity:   . Days of Exercise per Week:   . Minutes of Exercise per Session:   Stress:   . Feeling of Stress :   Social Connections:   . Frequency of Communication with Friends and Family:   . Frequency of Social Gatherings with Friends and Family:   . Attends Religious Services:   . Active Member of Clubs or Organizations:   . Attends Archivist Meetings:     Marland Kitchen Marital Status:     ECOG Status: 2 - Symptomatic, <50% confined to bed  Review of Systems  Review of Systems: A 12 point ROS discussed and pertinent positives are indicated in the HPI above.  All other systems are negative.  Physical Exam No direct physical exam was performed (except for noted visual exam findings with Video Visits).   Vital Signs: There were no vitals taken for this visit.  Imaging: No results found.  Labs:  CBC: No results for input(s): WBC, HGB, HCT, PLT  in the last 8760 hours.  COAGS: No results for input(s): INR, APTT in the last 8760 hours.  BMP: No results for input(s): NA, K, CL, CO2, GLUCOSE, BUN, CALCIUM, CREATININE, GFRNONAA, GFRAA in the last 8760 hours.  Invalid input(s): CMP  LIVER FUNCTION TESTS: No results for input(s): BILITOT, AST, ALT, ALKPHOS, PROT, ALBUMIN in the last 8760 hours.  TUMOR MARKERS: No results for input(s): AFPTM, CEA, CA199, CHROMGRNA in the last 8760 hours.  Assessment and Plan:  Rhonda Acevedo is a 69 y.o. 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  Patient has been evaluated for a total knee replacement though has been deemed a poor operative candidate given her elevated BMI and multiple medical comorbidities.   I explained that the [place] treatment is a new modality that utilizes cryoablation technology to temporarily alter the pain receptor nerves which supply the anterior aspect of the knee, in hopes of achieving a clinically significant reduction in knee pain.     I explained that if successful, the patient will experience a rapid pain reduction which can last for approximately 3 months.  I also explained that while the pain will (hopefully) be  reduced, the structural/functional damage of the knee remains, and thus, this is not a curative technology and their knee pain will return.     Risks associated with the procedure include bleeding/bruising, infection and post procedural paresthesias/weakness.   I explained that the procedure is performed as an outpatient basis at [facility].  The procedure requires local anesthesia and does NOT require conscious sedation, and therefore the patient does need to be NPO and there is no postprocedural recovery.  The patient was encouraged to wear either shorts or loose fitting pants that can be rolled up to the upper thigh.  The patient also does NOT need to hold anticoagulation for the procedure.   Following this prolonged and detailed conversation, the patient wishes to pursue IOVERA therapy.    As the patient reports her left-sided knee pain is primarily located anterior, while her right-sided knee pain is primarily posterior, I feel it is appropriate to initially proceed with LEFT-sided IOVERA treatment.  As such, pending insurance approval, this procedure would be scheduled at [facility] hospital at the next earliest convenience.   Thank you for this interesting consult.  I greatly enjoyed meeting Rhonda Acevedo and look forward to participating in their care.  A copy of this report was sent to the requesting provider on this date.  Electronically Signed: Sandi Mariscal 05/10/2019, 9:32 AM   I spent a total of 15 Minutes  in remote  clinical consultation, greater than 50% of which was counseling/coordinating care for knee pain.    Visit type: Audio only (telephone). Audio (no video) only due to patient's lack of internet/smartphone capability. Alternative for in-person consultation at [facility], [address], [place], [place]. This visit type was conducted due to national recommendations for restrictions regarding the COVID-19 Pandemic (e.g. social distancing).  This  format is felt to be most appropriate for this patient at this time.  All issues noted in this document were discussed and addressed.

## 2019-05-12 ENCOUNTER — Ambulatory Visit (HOSPITAL_COMMUNITY)
Admission: RE | Admit: 2019-05-12 | Discharge: 2019-05-12 | Disposition: A | Discharge status: Home / Self Care | Payer: PPO | Source: Ambulatory Visit | Attending: Interventional Radiology | Admitting: Interventional Radiology

## 2019-05-12 ENCOUNTER — Other Ambulatory Visit: Payer: Self-pay

## 2019-05-12 DIAGNOSIS — M25562 Pain in left knee: Secondary | ICD-10-CM | POA: Diagnosis not present

## 2019-05-12 DIAGNOSIS — G8929 Other chronic pain: Secondary | ICD-10-CM | POA: Insufficient documentation

## 2019-05-12 HISTORY — PX: IR ABLATE LIVER CRYOABLATION: IMG5524

## 2019-05-12 MED ORDER — LIDOCAINE-EPINEPHRINE 1 %-1:100000 IJ SOLN
INTRAMUSCULAR | Status: AC
  Filled 2019-05-12: qty 1

## 2019-05-12 MED ORDER — LIDOCAINE-EPINEPHRINE (PF) 2 %-1:200000 IJ SOLN
INTRAMUSCULAR | Status: AC | PRN
  Administered 2019-05-12: 10 mL

## 2019-05-19 ENCOUNTER — Telehealth: Payer: Self-pay | Admitting: *Deleted

## 2019-05-19 NOTE — Telephone Encounter (Signed)
lmom x2 to f/u post Iovera Therapy Lft knee and to see if interested in rt knee treatment./vm

## 2019-06-01 ENCOUNTER — Telehealth: Payer: Self-pay | Admitting: *Deleted

## 2019-06-01 NOTE — Telephone Encounter (Signed)
Called pt to inquire about scheduling Rt knee for Iovera Therapy lft msg on machine./vm

## 2019-06-01 NOTE — Telephone Encounter (Signed)
Ms. Fessenden was returning my call regarding treating the Rt knee with Iovera Therapy, she states " I don't feel like the feeling in my left knee, it has a burning and tingling sensation and it still painful. However she would like the rt knee treated if we can treat the back of the knee, otherwise no." I told her I would make an inquiry regarding back of knee.Cathren Harsh

## 2019-06-11 DIAGNOSIS — Z7901 Long term (current) use of anticoagulants: Secondary | ICD-10-CM | POA: Diagnosis not present

## 2019-06-17 DIAGNOSIS — Z7901 Long term (current) use of anticoagulants: Secondary | ICD-10-CM | POA: Diagnosis not present

## 2019-07-06 DIAGNOSIS — Z7901 Long term (current) use of anticoagulants: Secondary | ICD-10-CM | POA: Diagnosis not present

## 2019-07-06 DIAGNOSIS — J45909 Unspecified asthma, uncomplicated: Secondary | ICD-10-CM | POA: Diagnosis not present

## 2019-07-06 DIAGNOSIS — G4733 Obstructive sleep apnea (adult) (pediatric): Secondary | ICD-10-CM | POA: Diagnosis not present

## 2019-07-19 ENCOUNTER — Encounter: Payer: Self-pay | Admitting: Cardiology

## 2019-07-19 ENCOUNTER — Ambulatory Visit: Payer: PPO | Admitting: Cardiology

## 2019-07-19 ENCOUNTER — Other Ambulatory Visit: Payer: Self-pay

## 2019-07-19 VITALS — BP 135/70 | HR 60 | Ht 66.5 in | Wt 344.2 lb

## 2019-07-19 DIAGNOSIS — D6859 Other primary thrombophilia: Secondary | ICD-10-CM | POA: Diagnosis not present

## 2019-07-19 DIAGNOSIS — I480 Paroxysmal atrial fibrillation: Secondary | ICD-10-CM | POA: Diagnosis not present

## 2019-07-19 DIAGNOSIS — I10 Essential (primary) hypertension: Secondary | ICD-10-CM | POA: Diagnosis not present

## 2019-07-19 NOTE — Progress Notes (Signed)
Cardiology Office Note  Date: 07/19/2019   ID: Rhonda Acevedo, Rhonda Acevedo 1950/12/27, MRN [id number]  PCP:  Wenda Low, MD  Cardiologist:  Rozann Lesches, MD Electrophysiologist:  None   Chief Complaint  Patient presents with  . Cardiac follow-up    History of Present Illness: Rhonda Acevedo is a 69 y.o. female last seen in December 2020.  She presents for a routine visit.  She does not describe any significant palpitations at this point.  Mainly limited by bilateral knee pain, she uses a walker, denies any recent falls.  She continues on Coumadin with follow-up by PCP.  She states that this has been therapeutic range, does not describe any obvious bleeding problems.  I reviewed her ECG from December 2020 as outlined below.  I also went over her medications today.  Heart rate is regular on examination. She reports having physical and lab work in March.  Past Medical History:  Diagnosis Date  . Allergic rhinitis   . Esophageal reflux   . Essential hypertension   . Factor V Leiden mutation ([place])   . GERD (gastroesophageal reflux disease)   . History of cardiac catheterization    Normal coronaries July 2015  . History of DVT (deep vein thrombosis)   . History of pulmonary embolism    Multiple, status post IVC filter, on Coumadin  . Mixed hyperlipidemia   . Obstructive sleep apnea    NPSG 05-29-07; AHI 32.9,cpap 7 to 9  . On home oxygen therapy    2 liters at night- usual- no recent changes  . Osteoarthritis   . Persistent atrial fibrillation [facility])    Documented October 2016  . Transfusion history    [facility]    Past Surgical History:  Procedure Laterality Date  . CHOLECYSTECTOMY    . COLONOSCOPY WITH PROPOFOL N/A 06/13/2015   Procedure: COLONOSCOPY WITH PROPOFOL;  Surgeon: Garlan Fair, MD;  Location: [facility];  Service: Endoscopy;  Laterality: N/A;  . ENDOMETRIAL ABLATION    . GANGLION CYST EXCISION    .  Hernia abscess    . HERNIA REPAIR     umbilical  . I&D hernia incisional abscess    . IR ABLATE LIVER CRYOABLATION  05/12/2019  . IR RADIOLOGIST EVAL & MGMT  05/10/2019  . IVC filter Right   . LEFT HEART CATHETERIZATION WITH CORONARY ANGIOGRAM N/A 08/20/2013   Procedure: LEFT HEART CATHETERIZATION WITH CORONARY ANGIOGRAM;  Surgeon: Blane Ohara, MD;  [facility];  Service: Cardiovascular;  Laterality: N/A;  . ROTATOR CUFF REPAIR      Current Outpatient Medications  Medication Sig Dispense Refill  . albuterol (ACCUNEB) 1.25 MG/3ML nebulizer solution Take 1 ampule by nebulization every 6 (six) hours as needed for wheezing or shortness of breath.     Marland Kitchen albuterol (PROAIR HFA) 108 (90 BASE) MCG/ACT inhaler Inhale 2 puffs into the lungs 4 (four) times daily as needed for wheezing or shortness of breath.     Geronimo Boot XT 240 MG 24 hr capsule Take 1 capsule by mouth once daily 90 capsule 3  . cetirizine (ZYRTEC) 10 MG tablet Take 10 mg by mouth daily.     . famotidine (PEPCID) 20 MG tablet Take 40 mg by mouth as needed for heartburn or indigestion.     . flecainide (TAMBOCOR) 50 MG tablet Take 1 tablet by mouth twice daily 180 tablet 3  . furosemide (LASIX) 20 MG tablet Take 20 mg  by mouth 2 (two) times daily.     Marland Kitchen oxybutynin (DITROPAN-XL) 10 MG 24 hr tablet Take 10 mg by mouth at bedtime.     . OXYGEN Place 3 L into the nose at bedtime.    . polyethylene glycol powder (GLYCOLAX/MIRALAX) powder Take 17 g by mouth at bedtime.     . pravastatin (PRAVACHOL) 80 MG tablet Take 80 mg by mouth at bedtime.     . traMADol (ULTRAM) 50 MG tablet Take 50 mg by mouth 3 (three) times daily as needed for moderate pain.     . valsartan (DIOVAN) 80 MG tablet Take 80 mg by mouth daily.     Marland Kitchen warfarin (COUMADIN) 10 MG tablet Take 10 mg by mouth See admin instructions. Take one tablet daily except none of Friday.     No current facility-administered medications for this visit.   Allergies:  Adhesive  [tape] and Benazepril hcl   ROS:   No sudden dizziness or syncope.  Physical Exam: VS:  BP 135/70   Pulse 60   Ht 5' 6.5" (1.689 m)   Wt (!) 344 lb 3.2 oz (156.1 kg)   SpO2 98%   BMI 54.72 kg/m , BMI Body mass index is 54.72 kg/m.  Wt Readings from Last 3 Encounters:  07/19/19 (!) 344 lb 3.2 oz (156.1 kg)  04/19/19 (!) 357 lb (161.9 kg)  01/06/19 (!) 364 lb (165.1 kg)    General: Patient appears comfortable at rest.  Uses a walker. HEENT: Conjunctiva and lids normal, wearing a mask. Neck: Supple, no elevated JVP. Lungs: Clear to auscultation, nonlabored breathing at rest. Cardiac: Regular rate and rhythm, no S3 or significant systolic murmur, no pericardial rub. Extremities: Stable, chronic appearing edema and adipose tissue.  ECG:  An ECG dated 01/06/2019 was personally reviewed today and demonstrated:  Sinus rhythm with low voltage and nonspecific T wave changes.  Recent Labwork:  No interval lab work for review today.  Other Studies Reviewed Today:  Echocardiogram 11/02/2014: Study Conclusions  - Left ventricle: The cavity size was normal. Wall thickness was normal. Systolic function was normal. The estimated ejection fraction was in the range of 55% to 60%. Wall motion was normal; there were no regional wall motion abnormalities. - Left atrium: The atrium was mildly to moderately dilated. - Right atrium: The atrium was mildly dilated.  Assessment and Plan:  1.  Paroxysmal atrial fibrillation, CHA2DS2-VASc score is 3.  She has had good rhythm control on flecainide and also continues on Cartia XT along with Coumadin.  Requesting interval follow-up lab work from PCP for review.  2.  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  She continues on lifelong Coumadin with follow-up by PCP.  3.  Essential hypertension by history, systolic is in the 409W today.  No changes made to present regimen.   She is on Diovan as well.  Medication Adjustments/Labs and Tests Ordered: Current medicines are reviewed at length with the patient today.  Concerns regarding medicines are outlined above.   Tests Ordered: No orders of the defined types were placed in this encounter.   Medication Changes: No orders of the defined types were placed in this encounter.   Disposition:  Follow up 6 months in the [place] office.  Signed, Satira Sark, MD, [facility] 07/19/2019 10:21 AM    [facility] [place] [facility] [place]. [address] [place],  [address] Phone: [phone]; Fax: [phone] 

## 2019-07-19 NOTE — Patient Instructions (Signed)
Medication Instructions:  °Your physician recommends that you continue on your current medications as directed. Please refer to the Current Medication list given to you today. ° °*If you need a refill on your cardiac medications before your next appointment, please call your pharmacy* ° ° °Lab Work: °None today °If you have labs (blood work) drawn today and your tests are completely normal, you will receive your results only by: °• MyChart Message (if you have MyChart) OR °• A paper copy in the mail °If you have any lab test that is abnormal or we need to change your treatment, we will call you to review the results. ° ° °Testing/Procedures: °None today ° ° °Follow-Up: °At [facility], you and your health needs are our priority.  As part of our continuing mission to provide you with exceptional heart care, we have created designated Provider Care Teams.  These Care Teams include your primary Cardiologist (physician) and Advanced Practice Providers (APPs -  Physician Assistants and Nurse Practitioners) who all work together to provide you with the care you need, when you need it. ° °We recommend signing up for the patient portal called "MyChart".  Sign up information is provided on this After Visit Summary.  MyChart is used to connect with patients for Virtual Visits (Telemedicine).  Patients are able to view lab/test results, encounter notes, upcoming appointments, etc.  Non-urgent messages can be sent to your provider as well.   °To learn more about what you can do with MyChart, go to [email or web address].   ° °Your next appointment:   °6 month(s) ° °The format for your next appointment:   °In Person ° °Provider:   °Samuel McDowell, MD ° ° °Other Instructions °None ° ° ° ° °Thank you for choosing [facility] ! ° ° ° ° ° ° ° ° °

## 2019-07-22 DIAGNOSIS — G4733 Obstructive sleep apnea (adult) (pediatric): Secondary | ICD-10-CM | POA: Diagnosis not present

## 2019-07-22 DIAGNOSIS — J9611 Chronic respiratory failure with hypoxia: Secondary | ICD-10-CM | POA: Diagnosis not present

## 2019-07-22 DIAGNOSIS — I5032 Chronic diastolic (congestive) heart failure: Secondary | ICD-10-CM | POA: Diagnosis not present

## 2019-08-03 DIAGNOSIS — Z7901 Long term (current) use of anticoagulants: Secondary | ICD-10-CM | POA: Diagnosis not present

## 2019-09-07 DIAGNOSIS — Z7901 Long term (current) use of anticoagulants: Secondary | ICD-10-CM | POA: Diagnosis not present

## 2019-09-20 ENCOUNTER — Ambulatory Visit (INDEPENDENT_AMBULATORY_CARE_PROVIDER_SITE_OTHER): Payer: PPO

## 2019-09-20 ENCOUNTER — Ambulatory Visit: Payer: PPO | Admitting: Orthopaedic Surgery

## 2019-09-20 ENCOUNTER — Ambulatory Visit: Payer: Self-pay

## 2019-09-20 VITALS — Ht 66.5 in | Wt 340.0 lb

## 2019-09-20 DIAGNOSIS — M25562 Pain in left knee: Secondary | ICD-10-CM

## 2019-09-20 DIAGNOSIS — M25561 Pain in right knee: Secondary | ICD-10-CM | POA: Diagnosis not present

## 2019-09-20 DIAGNOSIS — M1711 Unilateral primary osteoarthritis, right knee: Secondary | ICD-10-CM

## 2019-09-20 DIAGNOSIS — Z6841 Body Mass Index (BMI) 40.0 and over, adult: Secondary | ICD-10-CM | POA: Diagnosis not present

## 2019-09-20 DIAGNOSIS — M1712 Unilateral primary osteoarthritis, left knee: Secondary | ICD-10-CM

## 2019-09-20 MED ORDER — METHYLPREDNISOLONE ACETATE 40 MG/ML IJ SUSP
40.0000 mg | INTRAMUSCULAR | Status: AC | PRN
  Administered 2019-09-20: 40 mg via INTRA_ARTICULAR

## 2019-09-20 MED ORDER — LIDOCAINE HCL 1 % IJ SOLN
3.0000 mL | INTRAMUSCULAR | Status: AC | PRN
  Administered 2019-09-20: 3 mL

## 2019-09-20 NOTE — Progress Notes (Unsigned)
c 

## 2019-09-20 NOTE — Progress Notes (Signed)
Office Visit Note   Patient: Rhonda Acevedo           Date of Birth: [birth year]           MRN: [id number] Visit Date: 09/20/2019              Requested by: Wenda Low, MD [address] [place],  [place] [address] PCP: Wenda Low, MD   Assessment & Plan: Visit Diagnoses:  1. Left knee pain, unspecified chronicity   2. Right knee pain, unspecified chronicity   3. Unilateral primary osteoarthritis, left knee   4. Unilateral primary osteoarthritis, right knee   5. Adult BMI 50.0-59.9 kg/sq m [facility])     Plan: The patient meets the AMA guidelines for Morbid (severe) obesity with a BMI > 40.0 and I have recommended weight loss.  I talked her extensively about weight loss.  She understands that the only thing that we can recommend at this standpoint is steroid injections at a minimum 73-month intervals while she continues to work on losing weight.  Right now she is not a weight that we can consider safely performing knee replacement surgery.  She understands this as well.  All question concerns were answered and addressed.  She did tolerate steroid injections well in both knees.  We will see her back in 4 months for repeat weight and BMI calculation as well as repeat steroid injections.  She understands this is all we can offer right now.    Follow-Up Instructions: Return in about 4 months (around 01/20/2020).   Orders:  Orders Placed This Encounter  Procedures  . XR Knee 1-2 Views Left  . XR Knee 1-2 Views Right   No orders of the defined types were placed in this encounter.     Procedures: No procedures performed   Clinical Data: No additional findings.   Subjective: Chief Complaint  Patient presents with  . Right Knee - Pain  . Left Knee - Pain  The patient is a 69 year old female that I am seeing for the first time.  She is referred for evaluation treatment of bilateral knee pain.  She has known osteoarthritis in both her knees.  She last had gel  injections in her knees in March of this year.  She also had steroid injections in January.  She is not a diabetic.  She does have a history of COPD and pulmonary embolism.  She is someone who is morbidly obese with a BMI of over 50.  She ambulates with a walker.  Her knee pain is daily and she has tried everything she can.  She does report a weight loss of about 20 pounds but only over 6 months.  She has never been referred to any type of weight loss specialist from what I understand.  She has talked about it some.  HPI  Review of Systems She does report chronic shortness of breath.  She denies any chest pain today.  She denies any fever, chills, nausea, vomiting  Objective: Vital Signs: Ht 5' 6.5" (1.689 m)   Wt (!) 340 lb (154.2 kg)   BMI 54.06 kg/m   Physical Exam She is alert and oriented in no acute distress Ortho Exam Examination of both knees shows global tenderness with limitations of range of motion.  Both knees have a large soft tissue envelope around the knees.  There is venous stasis changes in both lower legs. Specialty Comments:  No specialty comments available.  Imaging: XR Knee 1-2  Views Left  Result Date: 09/20/2019 3 views left knee show severe end-stage tricompartmental arthritis.  XR Knee 1-2 Views Right  Result Date: 09/20/2019 An AP and lateral of the right knee show severe tricompartment arthritis mostly affecting the lateral compartment.    PMFS History: Patient Active Problem List   Diagnosis Date Noted  . Unilateral primary osteoarthritis, left knee 09/20/2019  . Unilateral primary osteoarthritis, right knee 09/20/2019  . Morbid obesity due to excess calories ([place]) 05/23/2017  . Pulmonary embolism ([place]) 08/27/2013  . Exertional dyspnea 08/17/2013  . Chronic respiratory failure with hypoxia ([place]) 03/27/2010  . HYPERLIPIDEMIA 05/03/2007  . Essential hypertension, benign 05/03/2007  . Asthma, non-allergic 05/03/2007  . GERD 05/03/2007  . Obesity  hypoventilation syndrome ([place]) 04/28/2007  . Bronchitis, acute 04/28/2007  . Obstructive sleep apnea 04/28/2007   Past Medical History:  Diagnosis Date  . Allergic rhinitis   . Esophageal reflux   . Essential hypertension   . Factor V Leiden mutation ([place])   . GERD (gastroesophageal reflux disease)   . History of cardiac catheterization    Normal coronaries July 2015  . History of DVT (deep vein thrombosis)   . History of pulmonary embolism    Multiple, status post IVC filter, on Coumadin  . Mixed hyperlipidemia   . Obstructive sleep apnea    NPSG 05-29-07; AHI 32.9,cpap 7 to 9  . On home oxygen therapy    2 liters at night- usual- no recent changes  . Osteoarthritis   . Persistent atrial fibrillation [facility])    Documented October 2016  . Transfusion history    Last transfusion after umbilical [facility]nia- [facility]    Family History  Problem Relation Age of Onset  . Cancer Mother        Thyroid cancer  . Heart disease Father        Heart attack  . Allergies Other   . Asthma Other   . COPD Other     Past Surgical History:  Procedure Laterality Date  . CHOLECYSTECTOMY    . COLONOSCOPY WITH PROPOFOL N/A 06/13/2015   Procedure: COLONOSCOPY WITH PROPOFOL;  Surgeon: Garlan Fair, MD;  Location[facility];  Service: Endoscopy;  Laterality: N/A;  . ENDOMETRIAL ABLATION    . GANGLION CYST EXCISION    . Hernia abscess    . HERNIA REPAIR     umbilical  . I&D hernia incisional abscess    . IR ABLATE LIVER CRYOABLATION  05/12/2019  . IR RADIOLOGIST EVAL & MGMT  05/10/2019  . IVC filter Right   . LEFT HEART CATHETERIZATION WITH CORONARY ANGIOGRAM N/A 08/20/2013   Procedure: LEFT HEART CATHETERIZATION WITH CORONARY ANGIOGRAM;  Surgeon: Blane Ohara, MD;  Location: [facility] CATH LAB;  Service: Cardiovascular;  Laterality: N/A;  . ROTATOR CUFF REPAIR     Social History   Occupational History  . Occupation: Disability  Tobacco Use  . Smoking status: Never Smoker  .  Smokeless tobacco: Never Used  . Tobacco comment: only as a teen for 6 months  Vaping Use  . Vaping Use: Never used  Substance and Sexual Activity  . Alcohol use: No    Alcohol/week: 0.0 standard drinks  . Drug use: No  . Sexual activity: Not on file

## 2019-09-20 NOTE — Progress Notes (Signed)
   Procedure Note  Patient: Rhonda Acevedo             Date of Birth: 1950-07-06           MRN: [id number]             Visit Date: 09/20/2019  Procedures: Visit Diagnoses:  1. Left knee pain, unspecified chronicity   2. Right knee pain, unspecified chronicity   3. Unilateral primary osteoarthritis, left knee   4. Unilateral primary osteoarthritis, right knee   5. Adult BMI 50.0-59.9 kg/sq m [facility])     Large Joint Inj: R knee on 09/20/2019 1:58 PM Indications: diagnostic evaluation and pain Details: 22 G 1.5 in needle, superolateral approach  Arthrogram: No  Medications: 3 mL lidocaine 1 %; 40 mg methylPREDNISolone acetate 40 MG/ML Outcome: tolerated well, no immediate complications Procedure, treatment alternatives, risks and benefits explained, specific risks discussed. Consent was given by the patient. Immediately prior to procedure a time out was called to verify the correct patient, procedure, equipment, support staff and site/side marked as required. Patient was prepped and draped in the usual sterile fashion.   Large Joint Inj: L knee on 09/20/2019 1:58 PM Indications: diagnostic evaluation and pain Details: 22 G 1.5 in needle, superolateral approach  Arthrogram: No  Medications: 3 mL lidocaine 1 %; 40 mg methylPREDNISolone acetate 40 MG/ML Outcome: tolerated well, no immediate complications Procedure, treatment alternatives, risks and benefits explained, specific risks discussed. Consent was given by the patient. Immediately prior to procedure a time out was called to verify the correct patient, procedure, equipment, support staff and site/side marked as required. Patient was prepped and draped in the usual sterile fashion.

## 2019-09-29 DIAGNOSIS — Z1231 Encounter for screening mammogram for malignant neoplasm of breast: Secondary | ICD-10-CM | POA: Diagnosis not present

## 2019-09-29 DIAGNOSIS — Z803 Family history of malignant neoplasm of breast: Secondary | ICD-10-CM | POA: Diagnosis not present

## 2019-10-11 DIAGNOSIS — D6869 Other thrombophilia: Secondary | ICD-10-CM | POA: Diagnosis not present

## 2019-10-11 DIAGNOSIS — I5032 Chronic diastolic (congestive) heart failure: Secondary | ICD-10-CM | POA: Diagnosis not present

## 2019-10-11 DIAGNOSIS — Z7901 Long term (current) use of anticoagulants: Secondary | ICD-10-CM | POA: Diagnosis not present

## 2019-10-11 DIAGNOSIS — I4891 Unspecified atrial fibrillation: Secondary | ICD-10-CM | POA: Diagnosis not present

## 2019-10-11 DIAGNOSIS — J9611 Chronic respiratory failure with hypoxia: Secondary | ICD-10-CM | POA: Diagnosis not present

## 2019-10-11 DIAGNOSIS — J4540 Moderate persistent asthma, uncomplicated: Secondary | ICD-10-CM | POA: Diagnosis not present

## 2019-10-11 DIAGNOSIS — G4733 Obstructive sleep apnea (adult) (pediatric): Secondary | ICD-10-CM | POA: Diagnosis not present

## 2019-10-11 DIAGNOSIS — R7303 Prediabetes: Secondary | ICD-10-CM | POA: Diagnosis not present

## 2019-10-11 DIAGNOSIS — E7800 Pure hypercholesterolemia, unspecified: Secondary | ICD-10-CM | POA: Diagnosis not present

## 2019-10-11 DIAGNOSIS — I10 Essential (primary) hypertension: Secondary | ICD-10-CM | POA: Diagnosis not present

## 2019-10-11 DIAGNOSIS — I2699 Other pulmonary embolism without acute cor pulmonale: Secondary | ICD-10-CM | POA: Diagnosis not present

## 2019-10-28 DIAGNOSIS — J45909 Unspecified asthma, uncomplicated: Secondary | ICD-10-CM | POA: Diagnosis not present

## 2019-10-28 DIAGNOSIS — G4733 Obstructive sleep apnea (adult) (pediatric): Secondary | ICD-10-CM | POA: Diagnosis not present

## 2019-11-01 ENCOUNTER — Other Ambulatory Visit: Payer: Self-pay | Admitting: Cardiology

## 2019-11-09 DIAGNOSIS — Z7901 Long term (current) use of anticoagulants: Secondary | ICD-10-CM | POA: Diagnosis not present

## 2019-12-01 ENCOUNTER — Other Ambulatory Visit: Payer: Self-pay | Admitting: Cardiology

## 2019-12-14 DIAGNOSIS — Z7901 Long term (current) use of anticoagulants: Secondary | ICD-10-CM | POA: Diagnosis not present

## 2020-01-11 ENCOUNTER — Other Ambulatory Visit: Payer: Self-pay

## 2020-01-11 ENCOUNTER — Encounter: Payer: Self-pay | Admitting: Cardiology

## 2020-01-11 ENCOUNTER — Ambulatory Visit: Payer: PPO | Admitting: Cardiology

## 2020-01-11 VITALS — BP 148/84 | HR 78 | Ht 66.5 in | Wt 336.6 lb

## 2020-01-11 DIAGNOSIS — R011 Cardiac murmur, unspecified: Secondary | ICD-10-CM | POA: Diagnosis not present

## 2020-01-11 DIAGNOSIS — I480 Paroxysmal atrial fibrillation: Secondary | ICD-10-CM | POA: Diagnosis not present

## 2020-01-11 DIAGNOSIS — D6859 Other primary thrombophilia: Secondary | ICD-10-CM | POA: Diagnosis not present

## 2020-01-11 NOTE — Patient Instructions (Signed)
Medication Instructions:  Your physician recommends that you continue on your current medications as directed. Please refer to the Current Medication list given to you today.  *If you need a refill on your cardiac medications before your next appointment, please call your pharmacy*   Lab Work: None today If you have labs (blood work) drawn today and your tests are completely normal, you will receive your results only by: . MyChart Message (if you have MyChart) OR . A paper copy in the mail If you have any lab test that is abnormal or we need to change your treatment, we will call you to review the results.   Testing/Procedures: Your physician has requested that you have an echocardiogram. Echocardiography is a painless test that uses sound waves to create images of your heart. It provides your doctor with information about the size and shape of your heart and how well your heart's chambers and valves are working. This procedure takes approximately one hour. There are no restrictions for this procedure.     Follow-Up: At [facility], you and your health needs are our priority.  As part of our continuing mission to provide you with exceptional heart care, we have created designated Provider Care Teams.  These Care Teams include your primary Cardiologist (physician) and Advanced Practice Providers (APPs -  Physician Assistants and Nurse Practitioners) who all work together to provide you with the care you need, when you need it.  We recommend signing up for the patient portal called "MyChart".  Sign up information is provided on this After Visit Summary.  MyChart is used to connect with patients for Virtual Visits (Telemedicine).  Patients are able to view lab/test results, encounter notes, upcoming appointments, etc.  Non-urgent messages can be sent to your provider as well.   To learn more about what you can do with MyChart, go to [email or web address].    Your next appointment:   6  month(s)  The format for your next appointment:   In Person  Provider:   Samuel McDowell, MD   Other Instructions None       Thank you for choosing [facility] !         

## 2020-01-11 NOTE — Progress Notes (Signed)
Cardiology Office Note  Date: 01/11/2020   ID: Rhonda Acevedo, Rhonda Acevedo 10/17/1950, MRN [id number]  PCP:  Wenda Low, MD  Cardiologist:  Rozann Lesches, MD Electrophysiologist:  None   Chief Complaint  Patient presents with  . Cardiac follow-up    History of Present Illness: Rhonda Acevedo is a 69 y.o. female last seen in June.  She presents for a routine follow-up visit.  Reports no palpitations or chest discomfort on current medical therapy.  She is on Coumadin with follow-up by PCP.  She does not describe any spontaneous bleeding problems, recalls her last INR at 2.4.  I personally reviewed her ECG today which shows normal sinus rhythm with low voltage, decreased R wave progression, QRS 108 ms.  I reviewed her medications which are outlined below.  She remains on flecainide and Cardizem CD.  Past Medical History:  Diagnosis Date  . Allergic rhinitis   . Esophageal reflux   . Essential hypertension   . Factor V Leiden mutation ([place])   . GERD (gastroesophageal reflux disease)   . History of cardiac catheterization    Normal coronaries July 2015  . History of DVT (deep vein thrombosis)   . History of pulmonary embolism    Multiple, status post IVC filter, on Coumadin  . Mixed hyperlipidemia   . Obstructive sleep apnea    NPSG 05-29-07; AHI 32.9,cpap 7 to 9  . On home oxygen therapy    2 liters at night- usual- no recent changes  . Osteoarthritis   . Persistent atrial fibrillation [facility])    Documented October 2016  . Transfusion history    [facility]    Past Surgical History:  Procedure Laterality Date  . CHOLECYSTECTOMY    . COLONOSCOPY WITH PROPOFOL N/A 06/13/2015   Procedure: COLONOSCOPY WITH PROPOFOL;  Surgeon: Garlan Fair, MD;  [facility];  Service: Endoscopy;  Laterality: N/A;  . ENDOMETRIAL ABLATION    . GANGLION CYST EXCISION    . Hernia abscess    . HERNIA REPAIR     umbilical  . I&D  hernia incisional abscess    . IR ABLATE LIVER CRYOABLATION  05/12/2019  . IR RADIOLOGIST EVAL & MGMT  05/10/2019  . IVC filter Right   . LEFT HEART CATHETERIZATION WITH CORONARY ANGIOGRAM N/A 08/20/2013   Procedure: LEFT HEART CATHETERIZATION WITH CORONARY ANGIOGRAM;  Surgeon: Blane Ohara, MD;  Location: [facility] C[facility] LAB;  Service: Cardiovascular;  Laterality: N/A;  . ROTATOR CUFF REPAIR      Current Outpatient Medications  Medication Sig Dispense Refill  . cetirizine (ZYRTEC) 10 MG tablet Take 10 mg by mouth daily.    Marland Kitchen diltiazem (CARDIZEM CD) 240 MG 24 hr capsule Take 1 capsule by mouth once daily 90 capsule 1  . famotidine (PEPCID) 20 MG tablet Take 40 mg by mouth as needed for heartburn or indigestion.     . flecainide (TAMBOCOR) 50 MG tablet Take 1 tablet by mouth twice daily 180 tablet 3  . furosemide (LASIX) 20 MG tablet Take 20 mg by mouth 2 (two) times daily.     Marland Kitchen oxybutynin (DITROPAN) 5 MG tablet Take 5 mg by mouth 2 (two) times daily.    . OXYGEN Place 3 L into the nose at bedtime.    . polyethylene glycol powder (GLYCOLAX/MIRALAX) powder Take 17 g by mouth at bedtime.     . pravastatin (PRAVACHOL) 80 MG tablet Take 80 mg by mouth at bedtime.     Marland Kitchen  valsartan (DIOVAN) 80 MG tablet Take 80 mg by mouth daily.     Marland Kitchen warfarin (COUMADIN) 10 MG tablet Take 10 mg by mouth See admin instructions. Take one tablet daily except none of Friday.    . hydrOXYzine (ATARAX/VISTARIL) 25 MG tablet Take 25 mg by mouth daily.    Marland Kitchen triamcinolone ointment (KENALOG) 0.5 % Apply topically 2 (two) times daily as needed.     No current facility-administered medications for this visit.   Allergies:  Adhesive [tape] and Benazepril hcl   ROS: No palpitations or syncope.  Physical Exam: VS:  BP (!) 148/84   Pulse 78   Ht 5' 6.5" (1.689 m)   Wt (!) 336 lb 9.6 oz (152.7 kg)   SpO2 98%   BMI 53.51 kg/m , BMI Body mass index is 53.51 kg/m.  Wt Readings from Last 3 Encounters:  01/11/20 (!) 336 lb  9.6 oz (152.7 kg)  09/20/19 (!) 340 lb (154.2 kg)  07/19/19 (!) 344 lb 3.2 oz (156.1 kg)    General: Patient appears comfortable at rest.  Using a walker. HEENT: Conjunctiva and lids normal, wearing a mask. Neck: Supple, no elevated JVP or carotid bruits, no thyromegaly. Lungs: Clear to auscultation, nonlabored breathing at rest. Cardiac: Regular rate and rhythm, no S3, 2/6 basal systolic murmur, no pericardial rub. Extremities: Stable, chronic appearing edema.  ECG:  An ECG dated 01/06/2019 was personally reviewed today and demonstrated:  Sinus rhythm with low voltage and nonspecific T wave changes.  Recent Labwork:  March 2021: INR 2.0, hemoglobin 12.4, platelets 271, BUN 17, creatinine 0.88, potassium 4.3, AST 13, ALT 11, hemoglobin A1c 5.9%, cholesterol 163, triglycerides 184, HDL 60, LDL 72, TSH 2.05  Other Studies Reviewed Today:  Echocardiogram 11/02/2014: Study Conclusions  - Left ventricle: The cavity size was normal. Wall thickness was normal. Systolic function was normal. The estimated ejection fraction was in the range of 55% to 60%. Wall motion was normal; there were no regional wall motion abnormalities. - Left atrium: The atrium was mildly to moderately dilated. - Right atrium: The atrium was mildly dilated.  Assessment and Plan:  1.  Paroxysmal atrial fibrillation.  CHA2DS2-VASc score is 3.  She continues on Coumadin for stroke prophylaxis with follow-up INR by PCP.  Rhythm control has been good on combination of flecainide and Cardizem CD.  ECG reviewed.  2.  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  She is on lifelong anticoagulation.  3.  Cardiac murmur, will obtain a follow-up echocardiogram in comparison to study from 2016.  Medication Adjustments/Labs and Tests Ordered: Current medicines are reviewed at length with the patient today.  Concerns regarding medicines are outlined  above.   Tests Ordered: Orders Placed This Encounter  Procedures  . EKG 12-Lead  . ECHOCARDIOGRAM COMPLETE    Medication Changes: No orders of the defined types were placed in this encounter.   Disposition:  Follow up 6 months in the [place] office.  Signed, Satira Sark, MD, [facility] 01/11/2020 1:37 PM    [facility] at [facility] [address] [place], [place] [address] Phone: [phone]; Fax: [phone]

## 2020-01-17 ENCOUNTER — Ambulatory Visit (INDEPENDENT_AMBULATORY_CARE_PROVIDER_SITE_OTHER): Payer: PPO | Admitting: Orthopaedic Surgery

## 2020-01-17 ENCOUNTER — Encounter: Payer: Self-pay | Admitting: Orthopaedic Surgery

## 2020-01-17 VITALS — Ht 66.5 in | Wt 331.8 lb

## 2020-01-17 DIAGNOSIS — M25562 Pain in left knee: Secondary | ICD-10-CM | POA: Diagnosis not present

## 2020-01-17 DIAGNOSIS — M1712 Unilateral primary osteoarthritis, left knee: Secondary | ICD-10-CM | POA: Diagnosis not present

## 2020-01-17 DIAGNOSIS — M25561 Pain in right knee: Secondary | ICD-10-CM

## 2020-01-17 DIAGNOSIS — G8929 Other chronic pain: Secondary | ICD-10-CM

## 2020-01-17 DIAGNOSIS — Z7901 Long term (current) use of anticoagulants: Secondary | ICD-10-CM | POA: Diagnosis not present

## 2020-01-17 DIAGNOSIS — M1711 Unilateral primary osteoarthritis, right knee: Secondary | ICD-10-CM | POA: Diagnosis not present

## 2020-01-17 MED ORDER — METHYLPREDNISOLONE ACETATE 40 MG/ML IJ SUSP
40.0000 mg | INTRAMUSCULAR | Status: AC | PRN
  Administered 2020-01-17: 40 mg via INTRA_ARTICULAR

## 2020-01-17 MED ORDER — LIDOCAINE HCL 1 % IJ SOLN
3.0000 mL | INTRAMUSCULAR | Status: AC | PRN
  Administered 2020-01-17: 3 mL

## 2020-01-17 NOTE — Progress Notes (Signed)
Office Visit Note   Patient: Rhonda Acevedo           Date of Birth: 06/13/1950           MRN: [id number] Visit Date: 01/17/2020              Requested by: Georgann HousekeeperHusain, Karrar, MD [address] [place],  [place] [id number] PCP: Georgann HousekeeperHusain, Karrar, MD   Assessment & Plan: Visit Diagnoses:  1. Unilateral primary osteoarthritis, left knee   2. Unilateral primary osteoarthritis, right knee   3. Chronic pain of left knee   4. Chronic pain of right knee     Plan: She totally understands that she is not a surgical candidate given her weight.  I counseled her about this extensively.The patient meets the AMA guidelines for Morbid (severe) obesity with a BMI > 40.0 and I have recommended weight loss.  Per her request I did provide a steroid injection in both knees today which she tolerated well.  She knows to always wait between 3 and 4 months between steroid injections.  All questions and concerns were answered and addressed.  Follow-Up Instructions: Return if symptoms worsen or fail to improve.   Orders:  No orders of the defined types were placed in this encounter.  No orders of the defined types were placed in this encounter.     Procedures: Large Joint Inj: R knee on 01/17/2020 10:34 AM Indications: diagnostic evaluation and pain Details: 22 G 1.5 in needle, superolateral approach  Arthrogram: No  Medications: 3 mL lidocaine 1 %; 40 mg methylPREDNISolone acetate 40 MG/ML Outcome: tolerated well, no immediate complications Procedure, treatment alternatives, risks and benefits explained, specific risks discussed. Consent was given by the patient. Immediately prior to procedure a time out was called to verify the correct patient, procedure, equipment, support staff and site/side marked as required. Patient was prepped and draped in the usual sterile fashion.   Large Joint Inj: L knee on 01/17/2020 10:34 AM Indications: diagnostic evaluation and pain Details: 22 G 1.5 in  needle, superolateral approach  Arthrogram: No  Medications: 3 mL lidocaine 1 %; 40 mg methylPREDNISolone acetate 40 MG/ML Outcome: tolerated well, no immediate complications Procedure, treatment alternatives, risks and benefits explained, specific risks discussed. Consent was given by the patient. Immediately prior to procedure a time out was called to verify the correct patient, procedure, equipment, support staff and site/side marked as required. Patient was prepped and draped in the usual sterile fashion.       Clinical Data: No additional findings.   Subjective: Chief Complaint  Patient presents with  . Left Knee - Pain  . Right Knee - Pain  The patient is on we have seen before.  She has known debilitating end-stage arthritis of both her knees.  We last provided steroid injection in her knees in late August so it has been over 4 months now.  She said the injections have worn off.  She ambulates with a walker.  Her weight is 331 pounds with a BMI of 52.75.  She understands that she is not a candidate for knee replacement surgery given her weight and comorbidities.  She has had no other acute change in her medical status.  She ambulates with a rolling walker.  HPI  Review of Systems Today she denies any headache, chest pain, shortness of breath, fever, chills, nausea, vomiting  Objective: Vital Signs: Ht 5' 6.5" (1.689 m)   Wt (!) 331 lb 12.8 oz (150.5 kg)  BMI 52.75 kg/m   Physical Exam She is alert and orient x3 and in no acute distress Ortho Exam Both knees have no significant effusion but global pain with any attempted range of motion of the knees.  She has brawny venous stasis changes of both legs and her thighs are big. Specialty Comments:  No specialty comments available.  Imaging: No results found.   PMFS History: Patient Active Problem List   Diagnosis Date Noted  . Unilateral primary osteoarthritis, left knee 09/20/2019  . Unilateral primary  osteoarthritis, right knee 09/20/2019  . Morbid obesity due to excess calories ([place]) 05/23/2017  . Pulmonary embolism ([place]) 08/27/2013  . Exertional dyspnea 08/17/2013  . Chronic respiratory failure with hypoxia ([place]) 03/27/2010  . HYPERLIPIDEMIA 05/03/2007  . Essential hypertension, benign 05/03/2007  . Asthma, non-allergic 05/03/2007  . GERD 05/03/2007  . Obesity hypoventilation syndrome ([place]) 04/28/2007  . Bronchitis, acute 04/28/2007  . Obstructive sleep apnea 04/28/2007   Past Medical History:  Diagnosis Date  . Allergic rhinitis   . Esophageal reflux   . Essential hypertension   . Factor V Leiden mutation ([place])   . GERD (gastroesophageal reflux disease)   . History of cardiac catheterization    Normal coronaries July 2015  . History of DVT (deep vein thrombosis)   . History of pulmonary embolism    Multiple, status post IVC filter, on Coumadin  . Mixed hyperlipidemia   . Obstructive sleep apnea    NPSG 05-29-07; AHI 32.9,cpap 7 to 9  . On home oxygen therapy    2 liters at night- usual- no recent changes  . Osteoarthritis   . Persistent atrial fibrillation [facility])    Documented October 2016  . Transfusion history    Last transfusion [facility]- [facility]    Family History  Problem Relation Age of Onset  . Cancer Mother        Thyroid cancer  . Heart disease Father        Heart attack  . Allergies Other   . Asthma Other   . COPD Other     Past Surgical History:  Procedure Laterality Date  . CHOLECYSTECTOMY    . COLONOSCOPY WITH PROPOFOL N/A 06/13/2015   Procedure: COLONOSCOPY WITH PROPOFOL;  Surgeon: Garlan Fair, MD;  [facility];  Service: Endoscopy;  Laterality: N/A;  . ENDOMETRIAL ABLATION    . GANGLION CYST EXCISION    . Hernia abscess    . HERNIA REPAIR     umbilical  . I&D hernia incisional abscess    . IR ABLATE LIVER CRYOABLATION  05/12/2019  . IR RADIOLOGIST EVAL & MGMT  05/10/2019  . IVC filter Right   . LEFT HEART  CATHETERIZATION WITH CORONARY ANGIOGRAM N/A 08/20/2013   Procedure: LEFT HEART CATHETERIZATION WITH CORONARY ANGIOGRAM;  Surgeon: Blane Ohara, MD;  Location: [facility] CATH LAB;  Service: Cardiovascular;  Laterality: N/A;  . ROTATOR CUFF REPAIR     Social History   Occupational History  . Occupation: Disability  Tobacco Use  . Smoking status: Never Smoker  . Smokeless tobacco: Never Used  . Tobacco comment: only as a teen for 6 months  Vaping Use  . Vaping Use: Never used  Substance and Sexual Activity  . Alcohol use: No    Alcohol/week: 0.0 standard drinks  . Drug use: No  . Sexual activity: Not on file

## 2020-01-18 ENCOUNTER — Ambulatory Visit: Payer: PPO | Admitting: Orthopaedic Surgery

## 2020-02-15 ENCOUNTER — Other Ambulatory Visit: Payer: Self-pay

## 2020-02-15 ENCOUNTER — Ambulatory Visit (HOSPITAL_COMMUNITY)
Admission: RE | Admit: 2020-02-15 | Discharge: 2020-02-15 | Disposition: A | Discharge status: Home / Self Care | Payer: PPO | Source: Ambulatory Visit | Attending: Cardiology | Admitting: Cardiology

## 2020-02-15 DIAGNOSIS — R011 Cardiac murmur, unspecified: Secondary | ICD-10-CM | POA: Diagnosis not present

## 2020-02-15 LAB — ECHOCARDIOGRAM COMPLETE
AR max vel: 1.41 cm2
AV Area VTI: 1.4 cm2
AV Area mean vel: 1.47 cm2
AV Mean grad: 8 mmHg
AV Peak grad: 17.8 mmHg
Ao pk vel: 2.11 m/s
Area-P 1/2: 3.19 cm2
S' Lateral: 3.5 cm

## 2020-02-15 NOTE — Progress Notes (Signed)
*  PRELIMINARY RESULTS* Echocardiogram 2D Echocardiogram has been performed.  Rhonda Acevedo 02/15/2020, 3:21 PM

## 2020-02-16 DIAGNOSIS — Z7901 Long term (current) use of anticoagulants: Secondary | ICD-10-CM | POA: Diagnosis not present

## 2020-03-13 DIAGNOSIS — Z7901 Long term (current) use of anticoagulants: Secondary | ICD-10-CM | POA: Diagnosis not present

## 2020-03-20 DIAGNOSIS — Z7901 Long term (current) use of anticoagulants: Secondary | ICD-10-CM | POA: Diagnosis not present

## 2020-04-18 ENCOUNTER — Ambulatory Visit: Payer: PPO | Admitting: Orthopaedic Surgery

## 2020-04-18 ENCOUNTER — Encounter: Payer: Self-pay | Admitting: Orthopaedic Surgery

## 2020-04-18 VITALS — Ht 66.5 in | Wt 309.8 lb

## 2020-04-18 DIAGNOSIS — M1711 Unilateral primary osteoarthritis, right knee: Secondary | ICD-10-CM

## 2020-04-18 DIAGNOSIS — M25561 Pain in right knee: Secondary | ICD-10-CM | POA: Diagnosis not present

## 2020-04-18 DIAGNOSIS — G8929 Other chronic pain: Secondary | ICD-10-CM

## 2020-04-18 DIAGNOSIS — M25562 Pain in left knee: Secondary | ICD-10-CM

## 2020-04-18 DIAGNOSIS — M1712 Unilateral primary osteoarthritis, left knee: Secondary | ICD-10-CM | POA: Diagnosis not present

## 2020-04-18 NOTE — Progress Notes (Signed)
Office Visit Note   Patient: Rhonda Acevedo           Date of Birth: 03/26/1950           MRN: [id number] Visit Date: 04/18/2020              Requested by: Wenda Low, MD [address] [place],  [place] [address] PCP: Wenda Low, MD   Assessment & Plan: Visit Diagnoses:  1. Chronic pain of left knee   2. Chronic pain of right knee   3. Unilateral primary osteoarthritis, left knee   4. Unilateral primary osteoarthritis, right knee   5. Severe obesity (BMI >= 40) (HCC)     Plan: I am glad the patient continues to lose weight.  She understands that she does need to have a BMI below 40 before her insurance will cover surgery and due to the fact that BMI is over 40 have a much higher complication risk of infection and implant failure as well as skin and soft tissue issues.  I did agree to place steroid injection of both knees today which she tolerated well.  Follow-up will be in 3 months.  Follow-Up Instructions: Return in about 3 months (around 07/19/2020).   Orders:  No orders of the defined types were placed in this encounter.  No orders of the defined types were placed in this encounter.     Procedures: No procedures performed   Clinical Data: No additional findings.   Subjective: Chief Complaint  Patient presents with  . Left Knee - Pain  . Right Knee - Pain  The patient comes in today for continued follow-up as it relates to severe arthritis of both her knees.  Her last BMI was between 52 and 53.  She has lost weight and her BMI is now today 49.25.  Her weight is 309 pounds.  She has lost close to 100 pounds.  Her knee pain is still daily.  She last had steroid injections in both knees in December of this past year.  She is requesting steroid injections today.  She is not a diabetic.  She does ambulate with a walker.  She is continuing on her weight loss journey.  She has had no acute changes in her medical status.  HPI  Review of Systems She  currently denies a headache, chest pain, shortness of breath, fever, chills, nausea, vomiting  Objective: Vital Signs: Ht 5' 6.5" (1.689 m)   Wt (!) 309 lb 12.8 oz (140.5 kg)   BMI 49.25 kg/m   Physical Exam She is alert and orient x3 and in no acute distress Ortho Exam Examination of both knees shows a large soft tissue envelope around the thigh but not around the tibia.  Both knees have painful range of motion but are stable on exam.  Both knees have medial joint line tenderness and patellofemoral tenderness. Specialty Comments:  No specialty comments available.  Imaging: No results found.   PMFS History: Patient Active Problem List   Diagnosis Date Noted  . Unilateral primary osteoarthritis, left knee 09/20/2019  . Unilateral primary osteoarthritis, right knee 09/20/2019  . Morbid obesity due to excess calories ([place]) 05/23/2017  . Pulmonary embolism ([place]) 08/27/2013  . Exertional dyspnea 08/17/2013  . Chronic respiratory failure with hypoxia ([place]) 03/27/2010  . HYPERLIPIDEMIA 05/03/2007  . Essential hypertension, benign 05/03/2007  . Asthma, non-allergic 05/03/2007  . GERD 05/03/2007  . Obesity hypoventilation syndrome ([place]) 04/28/2007  . Bronchitis, acute 04/28/2007  .  Obstructive sleep apnea 04/28/2007   Past Medical History:  Diagnosis Date  . Allergic rhinitis   . Esophageal reflux   . Essential hypertension   . Factor V Leiden mutation ([place])   . GERD (gastroesophageal reflux disease)   . History of cardiac catheterization    Normal coronaries July 2015  . History of DVT (deep vein thrombosis)   . History of pulmonary embolism    Multiple, status post IVC filter, on Coumadin  . Mixed hyperlipidemia   . Obstructive sleep apnea    NPSG 05-29-07; AHI 32.9,cpap 7 to 9  . On home oxygen therapy    2 liters at night- usual- no recent changes  . Osteoarthritis   . Persistent atrial fibrillation [facility])    Documented October 2016  . Transfusion history    Last  transfusion after umbilical hernia- [facility]    Family History  Problem Relation Age of Onset  . Cancer Mother        Thyroid cancer  . Heart disease Father        Heart attack  . Allergies Other   . Asthma Other   . COPD Other     Past Surgical History:  Procedure Laterality Date  . CHOLECYSTECTOMY    . COLONOSCOPY WITH PROPOFOL N/A 06/13/2015   Procedure: COLONOSCOPY WITH PROPOFOL;  Surgeon: Garlan Fair, MD;  Location: [facility];  Service: Endoscopy;  Laterality: N/A;  . ENDOMETRIAL ABLATION    . GANGLION CYST EXCISION    . Hernia abscess    . HERNIA REPAIR     umbilical  . I&D hernia incisional abscess    . IR ABLATE LIVER CRYOABLATION  05/12/2019  . IR RADIOLOGIST EVAL & MGMT  05/10/2019  . IVC filter Right   . LEFT HEART CATHETERIZATION WITH CORONARY ANGIOGRAM N/A 08/20/2013   Procedure: LEFT HEART CATHETERIZATION WITH CORONARY ANGIOGRAM;  Surgeon: Blane Ohara, MD;  Location: [facility] CATH LAB;  Service: Cardiovascular;  Laterality: N/A;  . ROTATOR CUFF REPAIR     Social History   Occupational History  . Occupation: Disability  Tobacco Use  . Smoking status: Never Smoker  . Smokeless tobacco: Never Used  . Tobacco comment: only as a teen for 6 months  Vaping Use  . Vaping Use: Never used  Substance and Sexual Activity  . Alcohol use: No    Alcohol/week: 0.0 standard drinks  . Drug use: No  . Sexual activity: Not on file

## 2020-04-19 NOTE — Progress Notes (Signed)
HPI F followed for OSA, OHS, morbid obesity, chronic hypoxic respiratory failure, complicated by hx DVT/ PE/warfarin, asthma/ bronchitis, GERD PFT 04/12/2011-normal spirometry flows within significant response to bronchodilator. FEV1/FVC 0.79. Mild restriction and moderate diffusion reduction both may be do to her obesity. PFT: 02/10/2014-obstructive and restrictive changes and reduction of diffusion best explained by her obesity/hypoventilation syndrome ----------------------------------------------------------------------   04/19/19- 70 year old female never smoker followed for OSA, OHS, chronic hypoxic respiratory failure, morbid obesity, complicated by history DVT/PE/warfarin, asthma/bronchitis, GERD, P A. Fib ----- Chronic respiratory failure with hypoxia/OSA O2 3l at betime   Body weight today 357 CPAP auto 4-12 and O2 3L sleep/ [employer] Download compliance 100%, AHI 0.4/ hr Feels very comfortable with her CPAP and denies concerns. Does not want Covid vaccine 'because I don't want it", but dislikes wearing mask.  Not needing any inhalers at all, with no wheeze or cough. Knees hurt.  04/20/20- 70 year old female never smoker followed for OSA, OHS, chronic hypoxic respiratory failure, morbid obesity, complicated by history DVT/PE/warfarin, asthma/bronchitis, GERD, P A. Fib CPAP auto 4-12 and O2 3L sleep/ [employer] Download-compliance 100%, AHI 0.4/ hr Body weight today-308 lbs Covid vax-none Flu vax-none Echo 02/15/20- EF 60-65%, LAE Very comfortable with her CPAP. Reviewed download.  Denies needing inhalers. Got a little tight one day after exposure to strong odor. Found that breathing "essential oil" helped her quickly.  She is working to get her weight down so she can have knee surgery. Was 357 last time here and she says overall has lost about a hundred pounds.   ROS-see HPI  + = positive Constitutional:   +dieting weight loss, night sweats, fevers, chills, fatigue, lassitude. HEENT:    No-  headaches, difficulty swallowing, tooth/dental problems, sore throat,       No-  sneezing, itching, ear ache, nasal congestion, post nasal drip,  CV:  No-   chest pain, orthopnea, PND, swelling in lower extremities, anasarca,  dizziness, palpitations Resp: + shortness of breath with exertion or at rest.                productive cough,  non-productive cough,  No- coughing up of blood.               change in color of mucus.  No- wheezing.   Skin: No-   rash or lesions. GI:  No-   heartburn, indigestion, abdominal pain, nausea, vomiting,  GU:  MS:  No-   joint pain or swelling.   Neuro-     nothing unusual Psych:  No- change in mood or affect. No depression or anxiety.  No memory loss.  OBJ- Physical Exam General- Alert, Oriented, Affect-appropriate, Distress- none acute. +Morbid obesity.  Skin- rash-none, lesions- none, excoriation- none Lymphadenopathy- none Head- atraumatic            Eyes- Gross vision intact, PERRLA, conjunctivae and secretions clear            Ears- Hearing, canals-normal            Nose- Clear, no-Septal dev, mucus, polyps, erosion, perforation             Throat- Mallampati II-III , mucosa clear , drainage- none, tonsils- atrophic. No-hoarseness, throat clearing Neck- flexible , trachea midline, no stridor , thyroid nl, carotid no bruit Chest - symmetrical excursion , unlabored           Heart/CV- RRR , no murmur , no gallop  , no rub, nl s1 s2                           -  JVD- none , edema-+1, stasis changes+chronic, varices- none           Lung- clear to P&A, wheeze- none, cough- none, dullness-none, rub- none. Shallow c/w habitus           Chest wall-  Abd- Br/ Gen/ Rectal- Not done, not indicated Extrem- +rolling walker Neuro- grossly intact to observation

## 2020-04-20 ENCOUNTER — Ambulatory Visit (INDEPENDENT_AMBULATORY_CARE_PROVIDER_SITE_OTHER): Payer: PPO | Admitting: Internal Medicine

## 2020-04-20 ENCOUNTER — Encounter: Payer: Self-pay | Admitting: Internal Medicine

## 2020-04-20 ENCOUNTER — Other Ambulatory Visit: Payer: Self-pay

## 2020-04-20 DIAGNOSIS — I10 Essential (primary) hypertension: Secondary | ICD-10-CM | POA: Diagnosis not present

## 2020-04-20 DIAGNOSIS — Z7901 Long term (current) use of anticoagulants: Secondary | ICD-10-CM | POA: Diagnosis not present

## 2020-04-20 DIAGNOSIS — D510 Vitamin B12 deficiency anemia due to intrinsic factor deficiency: Secondary | ICD-10-CM | POA: Diagnosis not present

## 2020-04-20 DIAGNOSIS — I2699 Other pulmonary embolism without acute cor pulmonale: Secondary | ICD-10-CM

## 2020-04-20 DIAGNOSIS — J4540 Moderate persistent asthma, uncomplicated: Secondary | ICD-10-CM | POA: Diagnosis not present

## 2020-04-20 DIAGNOSIS — K219 Gastro-esophageal reflux disease without esophagitis: Secondary | ICD-10-CM | POA: Diagnosis not present

## 2020-04-20 DIAGNOSIS — I4891 Unspecified atrial fibrillation: Secondary | ICD-10-CM | POA: Diagnosis not present

## 2020-04-20 DIAGNOSIS — E7800 Pure hypercholesterolemia, unspecified: Secondary | ICD-10-CM | POA: Diagnosis not present

## 2020-04-20 DIAGNOSIS — G8929 Other chronic pain: Secondary | ICD-10-CM | POA: Diagnosis not present

## 2020-04-20 DIAGNOSIS — G4733 Obstructive sleep apnea (adult) (pediatric): Secondary | ICD-10-CM

## 2020-04-20 DIAGNOSIS — J9611 Chronic respiratory failure with hypoxia: Secondary | ICD-10-CM

## 2020-04-20 DIAGNOSIS — M1990 Unspecified osteoarthritis, unspecified site: Secondary | ICD-10-CM | POA: Diagnosis not present

## 2020-04-20 DIAGNOSIS — I5032 Chronic diastolic (congestive) heart failure: Secondary | ICD-10-CM | POA: Diagnosis not present

## 2020-04-20 NOTE — Assessment & Plan Note (Signed)
Continues O2 3L with her CPAP for sleep   (OSA/OHS, hx PE) Plan- no change

## 2020-04-20 NOTE — Assessment & Plan Note (Signed)
Strongly encouraged to keep to her long-term weight loss goals

## 2020-04-20 NOTE — Assessment & Plan Note (Signed)
Benefits from CPAP with good compliance and control Plan- continue auto 4-12 with O2 3L

## 2020-04-20 NOTE — Assessment & Plan Note (Signed)
She understands for continuing warfarin indefinitely

## 2020-04-20 NOTE — Patient Instructions (Signed)
We can continue CPAP auto 4-12, with oxygen during sleep through your CPAP at 3L  Please call if we can help

## 2020-04-27 DIAGNOSIS — Z7901 Long term (current) use of anticoagulants: Secondary | ICD-10-CM | POA: Diagnosis not present

## 2020-05-08 ENCOUNTER — Other Ambulatory Visit: Payer: Self-pay | Admitting: Cardiology

## 2020-05-09 DIAGNOSIS — R7303 Prediabetes: Secondary | ICD-10-CM | POA: Diagnosis not present

## 2020-05-09 DIAGNOSIS — Z7901 Long term (current) use of anticoagulants: Secondary | ICD-10-CM | POA: Diagnosis not present

## 2020-05-09 DIAGNOSIS — K219 Gastro-esophageal reflux disease without esophagitis: Secondary | ICD-10-CM | POA: Diagnosis not present

## 2020-05-09 DIAGNOSIS — I5032 Chronic diastolic (congestive) heart failure: Secondary | ICD-10-CM | POA: Diagnosis not present

## 2020-05-09 DIAGNOSIS — G4733 Obstructive sleep apnea (adult) (pediatric): Secondary | ICD-10-CM | POA: Diagnosis not present

## 2020-05-09 DIAGNOSIS — I4891 Unspecified atrial fibrillation: Secondary | ICD-10-CM | POA: Diagnosis not present

## 2020-05-09 DIAGNOSIS — Z0000 Encounter for general adult medical examination without abnormal findings: Secondary | ICD-10-CM | POA: Diagnosis not present

## 2020-05-09 DIAGNOSIS — I10 Essential (primary) hypertension: Secondary | ICD-10-CM | POA: Diagnosis not present

## 2020-05-09 DIAGNOSIS — E7800 Pure hypercholesterolemia, unspecified: Secondary | ICD-10-CM | POA: Diagnosis not present

## 2020-05-09 DIAGNOSIS — I2699 Other pulmonary embolism without acute cor pulmonale: Secondary | ICD-10-CM | POA: Diagnosis not present

## 2020-05-09 DIAGNOSIS — D510 Vitamin B12 deficiency anemia due to intrinsic factor deficiency: Secondary | ICD-10-CM | POA: Diagnosis not present

## 2020-05-09 DIAGNOSIS — D6869 Other thrombophilia: Secondary | ICD-10-CM | POA: Diagnosis not present

## 2020-05-15 DIAGNOSIS — H35371 Puckering of macula, right eye: Secondary | ICD-10-CM | POA: Diagnosis not present

## 2020-05-15 DIAGNOSIS — Z961 Presence of intraocular lens: Secondary | ICD-10-CM | POA: Diagnosis not present

## 2020-05-15 DIAGNOSIS — H52203 Unspecified astigmatism, bilateral: Secondary | ICD-10-CM | POA: Diagnosis not present

## 2020-05-25 DIAGNOSIS — Z7901 Long term (current) use of anticoagulants: Secondary | ICD-10-CM | POA: Diagnosis not present

## 2020-06-08 DIAGNOSIS — G8929 Other chronic pain: Secondary | ICD-10-CM | POA: Diagnosis not present

## 2020-06-08 DIAGNOSIS — I509 Heart failure, unspecified: Secondary | ICD-10-CM | POA: Diagnosis not present

## 2020-06-08 DIAGNOSIS — D649 Anemia, unspecified: Secondary | ICD-10-CM | POA: Diagnosis not present

## 2020-06-08 DIAGNOSIS — J4520 Mild intermittent asthma, uncomplicated: Secondary | ICD-10-CM | POA: Diagnosis not present

## 2020-06-08 DIAGNOSIS — I4891 Unspecified atrial fibrillation: Secondary | ICD-10-CM | POA: Diagnosis not present

## 2020-06-08 DIAGNOSIS — D510 Vitamin B12 deficiency anemia due to intrinsic factor deficiency: Secondary | ICD-10-CM | POA: Diagnosis not present

## 2020-06-08 DIAGNOSIS — J4540 Moderate persistent asthma, uncomplicated: Secondary | ICD-10-CM | POA: Diagnosis not present

## 2020-06-08 DIAGNOSIS — I10 Essential (primary) hypertension: Secondary | ICD-10-CM | POA: Diagnosis not present

## 2020-06-08 DIAGNOSIS — I5032 Chronic diastolic (congestive) heart failure: Secondary | ICD-10-CM | POA: Diagnosis not present

## 2020-06-08 DIAGNOSIS — M1990 Unspecified osteoarthritis, unspecified site: Secondary | ICD-10-CM | POA: Diagnosis not present

## 2020-06-08 DIAGNOSIS — K219 Gastro-esophageal reflux disease without esophagitis: Secondary | ICD-10-CM | POA: Diagnosis not present

## 2020-06-08 DIAGNOSIS — E7800 Pure hypercholesterolemia, unspecified: Secondary | ICD-10-CM | POA: Diagnosis not present

## 2020-06-27 DIAGNOSIS — Z7901 Long term (current) use of anticoagulants: Secondary | ICD-10-CM | POA: Diagnosis not present

## 2020-07-04 DIAGNOSIS — Z7901 Long term (current) use of anticoagulants: Secondary | ICD-10-CM | POA: Diagnosis not present

## 2020-07-17 ENCOUNTER — Encounter: Payer: Self-pay | Admitting: Orthopaedic Surgery

## 2020-07-17 ENCOUNTER — Ambulatory Visit: Payer: PPO | Admitting: Orthopaedic Surgery

## 2020-07-17 VITALS — Wt 293.0 lb

## 2020-07-17 DIAGNOSIS — M25561 Pain in right knee: Secondary | ICD-10-CM | POA: Diagnosis not present

## 2020-07-17 DIAGNOSIS — M25562 Pain in left knee: Secondary | ICD-10-CM | POA: Diagnosis not present

## 2020-07-17 DIAGNOSIS — G8929 Other chronic pain: Secondary | ICD-10-CM | POA: Diagnosis not present

## 2020-07-17 MED ORDER — METHYLPREDNISOLONE ACETATE 40 MG/ML IJ SUSP
40.0000 mg | INTRAMUSCULAR | Status: AC | PRN
  Administered 2020-07-17: 40 mg via INTRA_ARTICULAR

## 2020-07-17 MED ORDER — LIDOCAINE HCL 1 % IJ SOLN
3.0000 mL | INTRAMUSCULAR | Status: AC | PRN
  Administered 2020-07-17: 3 mL

## 2020-07-17 NOTE — Progress Notes (Signed)
Office Visit Note   Patient: Rhonda Acevedo           Date of Birth: [birth year]           MRN: [id number] Visit Date: 07/17/2020              Requested by: Wenda Low, MD [address] [place],  [place] [address] PCP: Wenda Low, MD   Assessment & Plan: Visit Diagnoses:  1. Chronic pain of left knee   2. Chronic pain of right knee   3. Severe obesity (BMI >= 40) (HCC)     Plan: I did place steroid junctions of both knees today without difficulty.  I counseled her about weight loss.  All question concerns were answered and addressed.  Follow-up can be as needed.  She knows to wait at least 3 months between injections.  Follow-Up Instructions: Return if symptoms worsen or fail to improve.   Orders:  Orders Placed This Encounter  Procedures   Large Joint Inj   Large Joint Inj   No orders of the defined types were placed in this encounter.     Procedures: Large Joint Inj: R knee on 07/17/2020 1:10 PM Indications: diagnostic evaluation and pain Details: 22 G 1.5 in needle, superolateral approach  Arthrogram: No  Medications: 3 mL lidocaine 1 %; 40 mg methylPREDNISolone acetate 40 MG/ML Outcome: tolerated well, no immediate complications Procedure, treatment alternatives, risks and benefits explained, specific risks discussed. Consent was given by the patient. Immediately prior to procedure a time out was called to verify the correct patient, procedure, equipment, support staff and site/side marked as required. Patient was prepped and draped in the usual sterile fashion.    Large Joint Inj: L knee on 07/17/2020 1:11 PM Indications: diagnostic evaluation and pain Details: 22 G 1.5 in needle, superolateral approach  Arthrogram: No  Medications: 3 mL lidocaine 1 %; 40 mg methylPREDNISolone acetate 40 MG/ML Outcome: tolerated well, no immediate complications Procedure, treatment alternatives, risks and benefits explained, specific risks discussed.  Consent was given by the patient. Immediately prior to procedure a time out was called to verify the correct patient, procedure, equipment, support staff and site/side marked as required. Patient was prepped and draped in the usual sterile fashion.      Clinical Data: No additional findings.   Subjective: Chief Complaint  Patient presents with   Left Knee - Pain   Right Knee - Pain  The patient comes in today requesting steroid injections in both her knees.  We have done this at least twice before.  She has known chronic knee pain and osteoarthritis in both knees.  Her BMI today is 46.  She understands why she is not a candidate for knee replacement surgery until she loses more weight.  She does deny any acute change in her medical status.  She says the injections helped some on occasion and sometimes dull.  She says most of her pain is at night and it wakes her up.  She is not a diabetic.  HPI  Review of Systems There is currently no listed headache, chest pain, shortness of breath, fever, chills, nausea, vomiting  Objective: Vital Signs: Wt 293 lb (132.9 kg)   BMI 46.37 kg/m   Physical Exam She is alert and oriented x3 and in no acute distress Ortho Exam Both knees show slight varus malalignment with global pain throughout the arc of motion.  Both knees have medial lateral joint line tenderness and patellofemoral crepitation.  No specialty comments available.  Imaging: No results found.   PMFS History: Patient Active Problem List   Diagnosis Date Noted   Unilateral primary osteoarthritis, left knee 09/20/2019   Unilateral primary osteoarthritis, right knee 09/20/2019   Morbid obesity due to excess calories ([place]) 05/23/2017   Pulmonary embolism ([place]) 08/27/2013   Exertional dyspnea 08/17/2013   Chronic respiratory failure with hypoxia ([place]) 03/27/2010   HYPERLIPIDEMIA 05/03/2007   Essential hypertension, benign 05/03/2007   Asthma, non-allergic 05/03/2007   GERD  05/03/2007   Obesity hypoventilation syndrome ([place]) 04/28/2007   Bronchitis, acute 04/28/2007   Obstructive sleep apnea 04/28/2007   Past Medical History:  Diagnosis Date   Allergic rhinitis    Esophageal reflux    Essential hypertension    Factor V Leiden mutation ([place])    GERD (gastroesophageal reflux disease)    History of cardiac catheterization    Normal coronaries July 2015   History of DVT (deep vein thrombosis)    History of pulmonary embolism    Multiple, status post IVC filter, on Coumadin   Mixed hyperlipidemia    Obstructive sleep apnea    NPSG 05-29-07; AHI 32.9,cpap 7 to 9   On home oxygen therapy    2 liters at night- usual- no recent changes   Osteoarthritis    Persistent atrial fibrillation [facility])    Documented October 2016   Transfusion history    Last transfusion after umbilical hernia- [facility]    Family History  Problem Relation Age of Onset   Cancer Mother        Thyroid cancer   Heart disease Father        Heart attack   Allergies Other    Asthma Other    COPD Other     Past Surgical History:  Procedure Laterality Date   CHOLECYSTECTOMY     COLONOSCOPY WITH PROPOFOL N/A 06/13/2015   Procedure: COLONOSCOPY WITH PROPOFOL;  Surgeon: Garlan Fair, MD;  [facility];  Service: Endoscopy;  Laterality: N/A;   ENDOMETRIAL ABLATION     GANGLION CYST EXCISION     Hernia abscess     HERNIA REPAIR     umbilical   I&D hernia incisional abscess     IR ABLATE LIVER CRYOABLATION  05/12/2019   IR RADIOLOGIST EVAL & MGMT  05/10/2019   IVC filter Right    LEFT HEART CATHETERIZATION WITH CORONARY ANGIOGRAM N/A 08/20/2013   Procedure: LEFT HEART CATHETERIZATION WITH CORONARY ANGIOGRAM;  Surgeon: Blane Ohara, MD;  Location: [facility] CATH LAB;  Service: Cardiovascular;  Laterality: N/A;   ROTATOR CUFF REPAIR     Social History   Occupational History   Occupation: Disability  Tobacco Use   Smoking status: Never   Smokeless tobacco: Never    Tobacco comments:    only as a teen for 6 months  Vaping Use   Vaping Use: Never used  Substance and Sexual Activity   Alcohol use: No    Alcohol/week: 0.0 standard drinks   Drug use: No   Sexual activity: Not on file

## 2020-08-01 DIAGNOSIS — Z7901 Long term (current) use of anticoagulants: Secondary | ICD-10-CM | POA: Diagnosis not present

## 2020-08-03 ENCOUNTER — Telehealth: Payer: Self-pay | Admitting: Cardiology

## 2020-08-03 NOTE — Telephone Encounter (Signed)
Pt is agreeable with stopping Valsartan and monitoring bp's. Pt will give [place] a call if bp's remain low w/ symptoms.

## 2020-08-03 NOTE — Telephone Encounter (Signed)
Correction to bp list: 97/66 119/76 94/58 116/79  Pt states that since Tuesday August 01, 2020, she has not taken Valsartan 80 mg tablets for fear it will drop her bp lower. Pt states that she usually takes bp and bp meds at night. Pt also would like to know if her medication needs to be adjusted d/t weight loss of 50 lbs. Pt c/o dizziness and headaches for the last 2 days, but denies other symptoms. Please advise.

## 2020-08-03 NOTE — Telephone Encounter (Signed)
Pt c/o BP issue:  1. What are your last 5 BP readings?  97/66 119/76 94/58 116./99  2. Are you having any other symptoms (ex. Dizziness, headache, blurred vision, passed out)? Yes Dizziness for several days now  3. What is your medication issue? Patient states that she has lost 50 lbs this year. Wanted to see if her BP medication needs to be adjusted. . She has not taken her BP medication for the past 2 days.

## 2020-08-07 ENCOUNTER — Telehealth: Payer: Self-pay | Admitting: Cardiology

## 2020-08-07 NOTE — Telephone Encounter (Signed)
New message     Pt c/o medication issue:  1. Name of Medication: diltiazem (CARDIZEM CD) 240 MG 24 hr capsule  2. How are you currently taking this medication (dosage and times per day)? 1 tablet daily  3. Are you having a reaction (difficulty breathing--STAT)? yes  4. What is your medication issue? She is having headache , dizziness , light headedness

## 2020-08-07 NOTE — Telephone Encounter (Signed)
Patient still c/o dizziness and low blood pressures after stopping Valsartan.cardizem dose is 240 mg daily.   BP"s : 110/76, 118/82, 98/63, 91/60     I will forward to Dr.McDowell for dispo.

## 2020-08-08 MED ORDER — DILTIAZEM HCL 120 MG PO TABS: 120.0000 mg | ORAL_TABLET | Freq: Every day | ORAL | 3 refills | Status: DC

## 2020-08-08 NOTE — Telephone Encounter (Signed)
Pt notified and verbalized understanding. Medication list updated to reflect medication changes.

## 2020-08-15 DIAGNOSIS — Z7901 Long term (current) use of anticoagulants: Secondary | ICD-10-CM | POA: Diagnosis not present

## 2020-08-17 DIAGNOSIS — G4733 Obstructive sleep apnea (adult) (pediatric): Secondary | ICD-10-CM

## 2020-08-17 NOTE — Telephone Encounter (Signed)
Fine to change to [employer]. Thanks

## 2020-08-17 NOTE — Telephone Encounter (Signed)
Dr. Annamaria Boots, Please see patient comment and advise if ok to send orders for supplies for her CPAP supplies to [facility].  Thank you.

## 2020-08-20 DIAGNOSIS — I5032 Chronic diastolic (congestive) heart failure: Secondary | ICD-10-CM | POA: Diagnosis not present

## 2020-08-20 DIAGNOSIS — D510 Vitamin B12 deficiency anemia due to intrinsic factor deficiency: Secondary | ICD-10-CM | POA: Diagnosis not present

## 2020-08-20 DIAGNOSIS — M1990 Unspecified osteoarthritis, unspecified site: Secondary | ICD-10-CM | POA: Diagnosis not present

## 2020-08-20 DIAGNOSIS — I4891 Unspecified atrial fibrillation: Secondary | ICD-10-CM | POA: Diagnosis not present

## 2020-08-20 DIAGNOSIS — J4540 Moderate persistent asthma, uncomplicated: Secondary | ICD-10-CM | POA: Diagnosis not present

## 2020-08-20 DIAGNOSIS — K219 Gastro-esophageal reflux disease without esophagitis: Secondary | ICD-10-CM | POA: Diagnosis not present

## 2020-08-20 DIAGNOSIS — E7800 Pure hypercholesterolemia, unspecified: Secondary | ICD-10-CM | POA: Diagnosis not present

## 2020-08-20 DIAGNOSIS — D649 Anemia, unspecified: Secondary | ICD-10-CM | POA: Diagnosis not present

## 2020-08-20 DIAGNOSIS — G8929 Other chronic pain: Secondary | ICD-10-CM | POA: Diagnosis not present

## 2020-08-20 DIAGNOSIS — I509 Heart failure, unspecified: Secondary | ICD-10-CM | POA: Diagnosis not present

## 2020-08-20 DIAGNOSIS — I10 Essential (primary) hypertension: Secondary | ICD-10-CM | POA: Diagnosis not present

## 2020-08-20 DIAGNOSIS — J4520 Mild intermittent asthma, uncomplicated: Secondary | ICD-10-CM | POA: Diagnosis not present

## 2020-09-12 DIAGNOSIS — Z7901 Long term (current) use of anticoagulants: Secondary | ICD-10-CM | POA: Diagnosis not present

## 2020-09-19 DIAGNOSIS — Z7901 Long term (current) use of anticoagulants: Secondary | ICD-10-CM | POA: Diagnosis not present

## 2020-10-17 DIAGNOSIS — Z7901 Long term (current) use of anticoagulants: Secondary | ICD-10-CM | POA: Diagnosis not present

## 2020-10-23 ENCOUNTER — Encounter: Payer: Self-pay | Admitting: Physician Assistant

## 2020-10-23 ENCOUNTER — Ambulatory Visit: Payer: PPO | Admitting: Physician Assistant

## 2020-10-23 VITALS — Ht 66.5 in | Wt 276.0 lb

## 2020-10-23 DIAGNOSIS — M1711 Unilateral primary osteoarthritis, right knee: Secondary | ICD-10-CM | POA: Diagnosis not present

## 2020-10-23 DIAGNOSIS — M1712 Unilateral primary osteoarthritis, left knee: Secondary | ICD-10-CM

## 2020-10-23 MED ORDER — LIDOCAINE HCL 1 % IJ SOLN
2.0000 mL | INTRAMUSCULAR | Status: AC | PRN
  Administered 2020-10-23: 2 mL

## 2020-10-23 MED ORDER — LIDOCAINE HCL 1 % IJ SOLN
5.0000 mL | INTRAMUSCULAR | Status: AC | PRN
  Administered 2020-10-23: 5 mL

## 2020-10-23 MED ORDER — METHYLPREDNISOLONE ACETATE 40 MG/ML IJ SUSP
40.0000 mg | INTRAMUSCULAR | Status: AC | PRN
  Administered 2020-10-23: 40 mg via INTRA_ARTICULAR

## 2020-10-23 NOTE — Progress Notes (Signed)
Office Visit Note   Patient: Rhonda Acevedo           Date of Birth: [birth year]           MRN: [id number] Visit Date: 10/23/2020              Requested by: Wenda Low, MD [address] [place],  [place] [address] PCP: Wenda Low, MD   Assessment & Plan: Visit Diagnoses:  1. Unilateral primary osteoarthritis, left knee   2. Unilateral primary osteoarthritis, right knee   3. Severe obesity (BMI >= 40) (HCC)     Plan: Patient is encouraged to continue to work on weight loss.  Then she will need to be below 40 BMI prior to undergoing knee replacement.  We will see her back in 3 months for weight check for possible steroid injection of both knees.  Questions encouraged and answered.  Follow-Up Instructions: Return in about 3 months (around 01/23/2021).   Orders:  Orders Placed This Encounter  Procedures   Large Joint Inj   No orders of the defined types were placed in this encounter.     Procedures: Large Joint Inj: bilateral knee on 10/23/2020 10:32 AM Indications: pain Details: 22 G 1.5 in needle, superolateral approach  Arthrogram: No  Medications (Right): 2 mL lidocaine 1 %; 40 mg methylPREDNISolone acetate 40 MG/ML Medications (Left): 5 mL lidocaine 1 %; 40 mg methylPREDNISolone acetate 40 MG/ML Aspirate (Left): 2 mL yellow and blood-tinged Outcome: tolerated well, no immediate complications Procedure, treatment alternatives, risks and benefits explained, specific risks discussed. Consent was given by the patient. Immediately prior to procedure a time out was called to verify the correct patient, procedure, equipment, support staff and site/side marked as required. Patient was prepped and draped in the usual sterile fashion.      Clinical Data: No additional findings.   Subjective: Chief Complaint  Patient presents with   Right Knee - Pain, Follow-up   Left Knee - Pain, Follow-up    HPI Rhonda Acevedo returns today for weight check.  She  has been working on weight loss.  Last weight was 309 pounds.  She has known end-stage arthritis bilateral knees.  Today she is ambulating with a walker.  She denies any new injury to either knee.  Nondiabetic Review of Systems Denies any fevers, chills or recent vaccines.  Objective: Vital Signs: Ht 5' 6.5" (1.689 m)   Wt 276 lb (125.2 kg)   BMI 43.88 kg/m   Physical Exam Constitutional:      Appearance: She is not ill-appearing or diaphoretic.  Pulmonary:     Effort: Pulmonary effort is normal.  Neurological:     Mental Status: She is alert.    Ortho Exam Bilateral knees no abnormal warmth erythema.  Range of motion both knees she has full extension both knees flexes to at least 90 degrees has pain with any range of motion attempts.  Tenderness medial joint line bilaterally.  Left knee slight effusion. Specialty Comments:  No specialty comments available.  Imaging: No results found.   PMFS History: Patient Active Problem List   Diagnosis Date Noted   Unilateral primary osteoarthritis, left knee 09/20/2019   Unilateral primary osteoarthritis, right knee 09/20/2019   Morbid obesity due to excess calories ([place]) 05/23/2017   Pulmonary embolism ([place]) 08/27/2013   Exertional dyspnea 08/17/2013   Chronic respiratory failure with hypoxia ([place]) 03/27/2010   HYPERLIPIDEMIA 05/03/2007   Essential hypertension, benign 05/03/2007   Asthma, non-allergic 05/03/2007  GERD 05/03/2007   Obesity hypoventilation syndrome ([place]) 04/28/2007   Bronchitis, acute 04/28/2007   Obstructive sleep apnea 04/28/2007   Past Medical History:  Diagnosis Date   Allergic rhinitis    Esophageal reflux    Essential hypertension    Factor V Leiden mutation ([place])    GERD (gastroesophageal reflux disease)    History of cardiac catheterization    Normal coronaries July 2015   History of DVT (deep vein thrombosis)    History of pulmonary embolism    Multiple, status post IVC filter, on Coumadin    Mixed hyperlipidemia    Obstructive sleep apnea    NPSG 05-29-07; AHI 32.9,cpap 7 to 9   On home oxygen therapy    2 liters at night- usual- no recent changes   Osteoarthritis    Persistent atrial fibrillation [facility])    Documented October 2016   Transfusion history    Last transfusion after umbilical hernia- [facility]    Family History  Problem Relation Age of Onset   Cancer Mother        Thyroid cancer   Heart disease Father        Heart attack   Allergies Other    Asthma Other    COPD Other     Past Surgical History:  Procedure Laterality Date   CHOLECYSTECTOMY     COLONOSCOPY WITH PROPOFOL N/A 06/13/2015   Procedure: COLONOSCOPY WITH PROPOFOL;  Surgeon: Garlan Fair, MD;  [facility];  Service: Endoscopy;  Laterality: N/A;   ENDOMETRIAL ABLATION     GANGLION CYST EXCISION     Hernia abscess     HERNIA REPAIR     umbilical   I&D hernia incisional abscess     IR ABLATE LIVER CRYOABLATION  05/12/2019   IR RADIOLOGIST EVAL & MGMT  05/10/2019   IVC filter Right    LEFT HEART CATHETERIZATION WITH CORONARY ANGIOGRAM N/A 08/20/2013   Procedure: LEFT HEART CATHETERIZATION WITH CORONARY ANGIOGRAM;  Surgeon: Blane Ohara, MD;  Location: [facility] CATH LAB;  Service: Cardiovascular;  Laterality: N/A;   ROTATOR CUFF REPAIR     Social History   Occupational History   Occupation: Disability  Tobacco Use   Smoking status: Never   Smokeless tobacco: Never   Tobacco comments:    only as a teen for 6 months  Vaping Use   Vaping Use: Never used  Substance and Sexual Activity   Alcohol use: No    Alcohol/week: 0.0 standard drinks   Drug use: No   Sexual activity: Not on file

## 2020-10-25 NOTE — Telephone Encounter (Signed)
Thank you for letting me know how things are going.  Since you were so symptomatic with lower blood pressures before and could not tolerate even the lowest dose of medication, I agree that we should stay off medication for now and just keep an eye on your blood pressure trend over time.

## 2020-11-07 DIAGNOSIS — E7800 Pure hypercholesterolemia, unspecified: Secondary | ICD-10-CM | POA: Diagnosis not present

## 2020-11-07 DIAGNOSIS — Z7901 Long term (current) use of anticoagulants: Secondary | ICD-10-CM | POA: Diagnosis not present

## 2020-11-07 DIAGNOSIS — J4520 Mild intermittent asthma, uncomplicated: Secondary | ICD-10-CM | POA: Diagnosis not present

## 2020-11-07 DIAGNOSIS — I5032 Chronic diastolic (congestive) heart failure: Secondary | ICD-10-CM | POA: Diagnosis not present

## 2020-11-07 DIAGNOSIS — J4540 Moderate persistent asthma, uncomplicated: Secondary | ICD-10-CM | POA: Diagnosis not present

## 2020-11-07 DIAGNOSIS — J9611 Chronic respiratory failure with hypoxia: Secondary | ICD-10-CM | POA: Diagnosis not present

## 2020-11-07 DIAGNOSIS — D6869 Other thrombophilia: Secondary | ICD-10-CM | POA: Diagnosis not present

## 2020-11-07 DIAGNOSIS — I4891 Unspecified atrial fibrillation: Secondary | ICD-10-CM | POA: Diagnosis not present

## 2020-11-07 DIAGNOSIS — I10 Essential (primary) hypertension: Secondary | ICD-10-CM | POA: Diagnosis not present

## 2020-11-07 DIAGNOSIS — I2699 Other pulmonary embolism without acute cor pulmonale: Secondary | ICD-10-CM | POA: Diagnosis not present

## 2020-11-07 DIAGNOSIS — G4733 Obstructive sleep apnea (adult) (pediatric): Secondary | ICD-10-CM | POA: Diagnosis not present

## 2020-11-07 DIAGNOSIS — R7303 Prediabetes: Secondary | ICD-10-CM | POA: Diagnosis not present

## 2020-11-20 DIAGNOSIS — M1990 Unspecified osteoarthritis, unspecified site: Secondary | ICD-10-CM | POA: Diagnosis not present

## 2020-11-20 DIAGNOSIS — I509 Heart failure, unspecified: Secondary | ICD-10-CM | POA: Diagnosis not present

## 2020-11-20 DIAGNOSIS — D510 Vitamin B12 deficiency anemia due to intrinsic factor deficiency: Secondary | ICD-10-CM | POA: Diagnosis not present

## 2020-11-20 DIAGNOSIS — G8929 Other chronic pain: Secondary | ICD-10-CM | POA: Diagnosis not present

## 2020-11-20 DIAGNOSIS — E7800 Pure hypercholesterolemia, unspecified: Secondary | ICD-10-CM | POA: Diagnosis not present

## 2020-11-20 DIAGNOSIS — J4520 Mild intermittent asthma, uncomplicated: Secondary | ICD-10-CM | POA: Diagnosis not present

## 2020-11-20 DIAGNOSIS — I5032 Chronic diastolic (congestive) heart failure: Secondary | ICD-10-CM | POA: Diagnosis not present

## 2020-11-20 DIAGNOSIS — I10 Essential (primary) hypertension: Secondary | ICD-10-CM | POA: Diagnosis not present

## 2020-11-20 DIAGNOSIS — J4540 Moderate persistent asthma, uncomplicated: Secondary | ICD-10-CM | POA: Diagnosis not present

## 2020-11-20 DIAGNOSIS — K219 Gastro-esophageal reflux disease without esophagitis: Secondary | ICD-10-CM | POA: Diagnosis not present

## 2020-11-20 DIAGNOSIS — I4891 Unspecified atrial fibrillation: Secondary | ICD-10-CM | POA: Diagnosis not present

## 2020-11-28 DIAGNOSIS — Z7901 Long term (current) use of anticoagulants: Secondary | ICD-10-CM | POA: Diagnosis not present

## 2020-12-07 NOTE — Progress Notes (Signed)
Cardiology Office Note  Date: 12/08/2020   ID: Rhonda Acevedo, DOB [birth year], MRN [id number]  PCP:  Rhonda Low, MD  Cardiologist:  Rozann Lesches, MD Electrophysiologist:  None   Chief Complaint: 31-month follow-up  History of Present Illness: Rhonda Acevedo is a 70 y.o. female with a history of HTN, PE, OSA, chronic respiratory failure with hypoxia, GERD, HLD, obesity hypoventilation syndrome, exertional dyspnea, factor V Leiden mutation, history of DVT.  She was last seen in the office by Dr. Domenic Polite on 01/10/2020 for routine follow-up.  She reported no palpitations or chest discomfort on medical therapy she was continuing Coumadin followed by PCP last INR was 2.4.  She did not have any bleeding issues.  EKG showed normal sinus rhythm with Acevedo voltage, decreased R wave progression, QRS 108 MS.  She remained on flecainide and Cardizem CD.  Her CHA2DS2-VASc score was 3.  She was continuing Coumadin for stroke prophylaxis with follow-up INR by PCP rhythm was controlled on combination of flecainide and Cardizem CD.  She was continuing lifelong anticoagulation for hypercoagulable state with factor V Leiden mutation and history of recurrent DVTs and PEs.  She had a cardiac murmur and follow-up echocardiogram was ordered.  This  She recently called with some issues with Acevedo blood pressures.  She had not taken her valsartan 80 mg tablets on the week of 08/03/2020 due to fear it would drop her blood pressures given currently Acevedo blood pressures.  She had lost approximately 50 pounds recently and complaining of dizziness and headaches for the prior 2 days.  Dr. Domenic Polite advised stopping valsartan at that point.  She was advised to call if blood pressure remains Acevedo with symptoms.  She called 4 days later with continued Acevedo blood pressures.  Her Cardizem dose was reduced from 240 mg down to 120 mg daily.  She called again on 10/25/2020 stating she had reduced her medications until she was not  taking any and was feeling much better.  Blood pressure was better per her statement.  She is here today for 18-month follow-up.  States she is feeling much better and has lost a fair amount of weight recently around 50 pounds.  She states her blood pressure is much better controlled since losing the weight and she had recently stopped all of her antihypertensive medications.  Blood pressure today on arrival is 120/90.  She states blood pressures at home are better.  EKG today shows normal sinus rhythm with a rate of 73.  Cannot rule out anterior infarct age undetermined.  She states she is attempting to lose weight because she needs bilateral knee replacements and must lose weight in order to have the surgery.  She denies any bleeding on Coumadin.  She denies any DOE, SOB, palpitations or arrhythmias, orthostatic symptoms, CVA or TIA-like symptoms, PND, orthopnea, DVT or PE-like symptoms, or lower extremity edema.  She is using a walker today to ambulate due to significant knee arthritis.  She is compliant with her flecainide, pravastatin, and Coumadin therapy    Past Medical History:  Diagnosis Date   Allergic rhinitis    Esophageal reflux    Essential hypertension    Factor V Leiden mutation (HCC)    GERD (gastroesophageal reflux disease)    History of cardiac catheterization    Normal coronaries July 2015   History of DVT (deep vein thrombosis)    History of pulmonary embolism    Multiple, status post IVC filter, on Coumadin   Mixed hyperlipidemia  Obstructive sleep apnea    NPSG 05-29-07; AHI 32.9,cpap 7 to 9   On home oxygen therapy    2 liters at night- usual- no recent changes   Osteoarthritis    Persistent atrial fibrillation [facility])    Documented October 2016   Transfusion history    Last transfusion after umbilical hernia- [facility] hospital    Past Surgical History:  Procedure Laterality Date   CHOLECYSTECTOMY     COLONOSCOPY WITH PROPOFOL N/A 06/13/2015   Procedure:  COLONOSCOPY WITH PROPOFOL;  Surgeon: Garlan Fair, MD;  Location: [facility];  Service: Endoscopy;  Laterality: N/A;   ENDOMETRIAL ABLATION     GANGLION CYST EXCISION     Hernia abscess     HERNIA REPAIR     umbilical   I&D hernia incisional abscess     IR ABLATE LIVER CRYOABLATION  05/12/2019   IR RADIOLOGIST EVAL & MGMT  05/10/2019   IVC filter Right    LEFT HEART CATHETERIZATION WITH CORONARY ANGIOGRAM N/A 08/20/2013   Procedure: LEFT HEART CATHETERIZATION WITH CORONARY ANGIOGRAM;  Surgeon: Blane Ohara, MD;  Location: [facility] CATH LAB;  Service: Cardiovascular;  Laterality: N/A;   ROTATOR CUFF REPAIR      Current Outpatient Medications  Medication Sig Dispense Refill   flecainide (TAMBOCOR) 50 MG tablet Take 1 tablet by mouth twice daily 180 tablet 3   hydrOXYzine (ATARAX/VISTARIL) 25 MG tablet Take 25 mg by mouth daily.     oxybutynin (DITROPAN) 5 MG tablet Take 5 mg by mouth 2 (two) times daily.     pravastatin (PRAVACHOL) 80 MG tablet Take 80 mg by mouth at bedtime.      traMADol (ULTRAM) 50 MG tablet Take 50 mg by mouth 3 (three) times daily as needed.     triamcinolone ointment (KENALOG) 0.5 % Apply topically 2 (two) times daily as needed.     warfarin (COUMADIN) 10 MG tablet Take 10 mg by mouth See admin instructions. Take one tablet daily except none of Friday.     No current facility-administered medications for this visit.   Allergies:  Adhesive [tape] and Benazepril hcl   Social History: The patient  reports that she has never smoked. She has never used smokeless tobacco. She reports that she does not drink alcohol and does not use drugs.   Family History: The patient's family history includes Allergies in an other family member; Asthma in an other family member; COPD in an other family member; Cancer in her mother; Heart disease in her father.   ROS:  Please see the history of present illness. Otherwise, complete review of systems is positive for none.  All other  systems are reviewed and negative.   Physical Exam: VS:  BP 120/90   Pulse 74   Ht 5' 6.5" (1.689 m)   Wt 273 lb 12.8 oz (124.2 kg)   SpO2 97%   BMI 43.53 kg/m , BMI Body mass index is 43.53 kg/m.  Wt Readings from Last 3 Encounters:  12/08/20 273 lb 12.8 oz (124.2 kg)  10/23/20 276 lb (125.2 kg)  07/17/20 293 lb (132.9 kg)    General: Morbidly obese patient appears comfortable at rest. Neck: Supple, no elevated JVP or carotid bruits, no thyromegaly. Lungs: Clear to auscultation, nonlabored breathing at rest. Cardiac: Regular rate and rhythm, no S3 or significant systolic murmur, no pericardial rub. Extremities: No pitting edema, distal pulses 2+. Skin: Warm and dry. Musculoskeletal: No kyphosis. Neuropsychiatric: Alert and oriented x3, affect grossly appropriate.  ECG: 12/08/2020 sinus rhythm rate of 73, cannot rule out anterior infarct age undetermined.  Recent Labwork: No results found for requested labs within last 8760 hours.  No results found for: CHOL, TRIG, HDL, CHOLHDL, VLDL, LDLCALC, LDLDIRECT  Other Studies Reviewed Today:  Echocardiogram 02/15/2020 1. Left ventricular ejection fraction, by estimation, is 60 to 65%. The  left ventricle has normal function. The left ventricle has no regional  wall motion abnormalities. Left ventricular diastolic parameters are  consistent with Grade I diastolic  dysfunction (impaired relaxation).   2. Right ventricular systolic function is normal. The right ventricular  size is normal.   3. Left atrial size was moderately dilated.   4. The mitral valve is normal in structure. No evidence of mitral valve  regurgitation. No evidence of mitral stenosis.   5. The aortic valve is tricuspid. Aortic valve regurgitation is not  visualized. No aortic stenosis is present.   6. The inferior vena cava is normal in size with greater than 50%  respiratory variability, suggesting right atrial pressure of 3 mmHg.     Echocardiogram  11/02/2014: Study Conclusions   - Left ventricle: The cavity size was normal. Wall thickness was   normal. Systolic function was normal. The estimated ejection   fraction was in the range of 55% to 60%. Wall motion was normal;   there were no regional wall motion abnormalities. - Left atrium: The atrium was mildly to moderately dilated. - Right atrium: The atrium was mildly dilated.  Assessment and Plan:  1. PAF (paroxysmal atrial fibrillation) ([place])   2. Mixed hyperlipidemia    1. PAF (paroxysmal atrial fibrillation) (HCC) EKG today shows normal sinus rhythm with a rate of 73, cannot rule out anterior infarct age undetermined.  Continue flecainide 50 mg p.o. twice daily continue Coumadin as directed by Coumadin clinic.  Patient states her recent INR was 2.4.  2.  Mixed hyperlipidemia Continue pravastatin 80 mg p.o. daily.  Previous lipid panel on 04/07/2019 TC 163, TG 184, HDL 60, LDL 72.   Medication Adjustments/Labs and Tests Ordered: Current medicines are reviewed at length with the patient today.  Concerns regarding medicines are outlined above.   Disposition: Follow-up with Dr. Domenic Polite or APP 6 months  Signed, Levell July, NP 12/08/2020 3:11 PM    [facility] [place], [place], [place] [address] Phone: [phone]; Fax: [phone]

## 2020-12-08 ENCOUNTER — Encounter: Payer: Self-pay | Admitting: Family Medicine

## 2020-12-08 ENCOUNTER — Ambulatory Visit: Payer: PPO | Admitting: Cardiology

## 2020-12-08 ENCOUNTER — Ambulatory Visit: Payer: PPO | Admitting: Family Medicine

## 2020-12-08 VITALS — BP 120/90 | HR 74 | Ht 66.5 in | Wt 273.8 lb

## 2020-12-08 DIAGNOSIS — E782 Mixed hyperlipidemia: Secondary | ICD-10-CM | POA: Diagnosis not present

## 2020-12-08 DIAGNOSIS — I480 Paroxysmal atrial fibrillation: Secondary | ICD-10-CM | POA: Diagnosis not present

## 2020-12-08 NOTE — Patient Instructions (Addendum)

## 2020-12-26 DIAGNOSIS — Z7901 Long term (current) use of anticoagulants: Secondary | ICD-10-CM | POA: Diagnosis not present

## 2021-01-08 DIAGNOSIS — G8929 Other chronic pain: Secondary | ICD-10-CM | POA: Diagnosis not present

## 2021-01-08 DIAGNOSIS — D649 Anemia, unspecified: Secondary | ICD-10-CM | POA: Diagnosis not present

## 2021-01-08 DIAGNOSIS — M179 Osteoarthritis of knee, unspecified: Secondary | ICD-10-CM | POA: Diagnosis not present

## 2021-01-08 DIAGNOSIS — K219 Gastro-esophageal reflux disease without esophagitis: Secondary | ICD-10-CM | POA: Diagnosis not present

## 2021-01-08 DIAGNOSIS — J4520 Mild intermittent asthma, uncomplicated: Secondary | ICD-10-CM | POA: Diagnosis not present

## 2021-01-08 DIAGNOSIS — I5032 Chronic diastolic (congestive) heart failure: Secondary | ICD-10-CM | POA: Diagnosis not present

## 2021-01-08 DIAGNOSIS — E7800 Pure hypercholesterolemia, unspecified: Secondary | ICD-10-CM | POA: Diagnosis not present

## 2021-01-08 DIAGNOSIS — I509 Heart failure, unspecified: Secondary | ICD-10-CM | POA: Diagnosis not present

## 2021-01-08 DIAGNOSIS — D510 Vitamin B12 deficiency anemia due to intrinsic factor deficiency: Secondary | ICD-10-CM | POA: Diagnosis not present

## 2021-01-08 DIAGNOSIS — I10 Essential (primary) hypertension: Secondary | ICD-10-CM | POA: Diagnosis not present

## 2021-01-08 DIAGNOSIS — J4540 Moderate persistent asthma, uncomplicated: Secondary | ICD-10-CM | POA: Diagnosis not present

## 2021-01-08 DIAGNOSIS — I4891 Unspecified atrial fibrillation: Secondary | ICD-10-CM | POA: Diagnosis not present

## 2021-01-23 DIAGNOSIS — Z7901 Long term (current) use of anticoagulants: Secondary | ICD-10-CM | POA: Diagnosis not present

## 2021-02-05 ENCOUNTER — Encounter: Payer: Self-pay | Admitting: Orthopaedic Surgery

## 2021-02-05 ENCOUNTER — Ambulatory Visit: Payer: PPO | Admitting: Orthopaedic Surgery

## 2021-02-05 ENCOUNTER — Other Ambulatory Visit: Payer: Self-pay

## 2021-02-05 VITALS — Wt 268.0 lb

## 2021-02-05 DIAGNOSIS — M25561 Pain in right knee: Secondary | ICD-10-CM | POA: Diagnosis not present

## 2021-02-05 DIAGNOSIS — M25562 Pain in left knee: Secondary | ICD-10-CM

## 2021-02-05 DIAGNOSIS — G8929 Other chronic pain: Secondary | ICD-10-CM

## 2021-02-05 MED ORDER — METHYLPREDNISOLONE ACETATE 40 MG/ML IJ SUSP
40.0000 mg | INTRAMUSCULAR | Status: AC | PRN
  Administered 2021-02-05: 40 mg via INTRA_ARTICULAR

## 2021-02-05 MED ORDER — LIDOCAINE HCL 1 % IJ SOLN
3.0000 mL | INTRAMUSCULAR | Status: AC | PRN
  Administered 2021-02-05: 3 mL

## 2021-02-05 NOTE — Progress Notes (Signed)
Office Visit Note   Patient: Rhonda Acevedo           Date of Birth: 03-18-1950           MRN: [id number] Visit Date: 02/05/2021              Requested by: Wenda Low, MD [address] [place],  [place] [address] PCP: Wenda Low, MD   Assessment & Plan: Visit Diagnoses:  1. Chronic pain of left knee   2. Chronic pain of right knee   3. Severe obesity (BMI >= 40) (HCC)     Plan:   Per the patient's request I did place a steroid injection of both knees which he tolerated well.  Follow-up is as needed however she understands that she can get these again in 3 months.  Anytime she does come in she does need a new weight and BMI calculation.  Follow-Up Instructions: Return if symptoms worsen or fail to improve.   Orders:  Orders Placed This Encounter  Procedures   Large Joint Inj   No orders of the defined types were placed in this encounter.     Procedures: Large Joint Inj: L knee on 02/05/2021 12:58 PM Indications: diagnostic evaluation and pain Details: 22 G 1.5 in needle, superolateral approach  Arthrogram: No  Medications: 3 mL lidocaine 1 %; 40 mg methylPREDNISolone acetate 40 MG/ML Outcome: tolerated well, no immediate complications Procedure, treatment alternatives, risks and benefits explained, specific risks discussed. Consent was given by the patient. Immediately prior to procedure a time out was called to verify the correct patient, procedure, equipment, support staff and site/side marked as required. Patient was prepped and draped in the usual sterile fashion.    Large Joint Inj: R knee on 02/05/2021 1:06 PM Indications: diagnostic evaluation and pain Details: 22 G 1.5 in needle, superolateral approach  Arthrogram: No  Medications: 3 mL lidocaine 1 %; 40 mg methylPREDNISolone acetate 40 MG/ML Outcome: tolerated well, no immediate complications Procedure, treatment alternatives, risks and benefits explained, specific risks discussed.  Consent was given by the patient. Immediately prior to procedure a time out was called to verify the correct patient, procedure, equipment, support staff and site/side marked as required. Patient was prepped and draped in the usual sterile fashion.      Clinical Data: No additional findings.   Subjective: No chief complaint on file. The patient comes in today requesting steroid injections in both knees.  She has known well-documented arthritis that is severe of both knees.  When we first injected her knees in June of last year her BMI was 46.  Today it is down to 42.6.  She is requesting steroid injections in both knees.  She last had these in October.  Even when her BMI was 43.8.  She has had no acute change in her medical status.  She does state that she used to weigh over 400 pounds and she is down to 268 pounds.  HPI  Review of Systems There is currently listed no headache, chest pain, shortness of breath, fever, chills, nausea, vomiting  Objective: Vital Signs: Wt 268 lb (121.6 kg)    BMI 42.61 kg/m   Physical Exam She is alert and orient x3 and in no acute distress Ortho Exam Examination of both knees shows global tenderness and pain throughout the arc of motion.  There is patellofemoral crepitation and slight varus malalignment with medial joint line tenderness.  She does have venous stasis changes in both her  legs.  There is no open wounds. Specialty Comments:  No specialty comments available.  Imaging: No results found.   PMFS History: Patient Active Problem List   Diagnosis Date Noted   Unilateral primary osteoarthritis, left knee 09/20/2019   Unilateral primary osteoarthritis, right knee 09/20/2019   Morbid obesity due to excess calories ([place]) 05/23/2017   Pulmonary embolism ([place]) 08/27/2013   Exertional dyspnea 08/17/2013   Chronic respiratory failure with hypoxia ([place]) 03/27/2010   HYPERLIPIDEMIA 05/03/2007   Essential hypertension, benign 05/03/2007   Asthma,  non-allergic 05/03/2007   GERD 05/03/2007   Obesity hypoventilation syndrome ([place]) 04/28/2007   Bronchitis, acute 04/28/2007   Obstructive sleep apnea 04/28/2007   Past Medical History:  Diagnosis Date   Allergic rhinitis    Esophageal reflux    Essential hypertension    Factor V Leiden mutation ([place])    GERD (gastroesophageal reflux disease)    History of cardiac catheterization    Normal coronaries July 2015   History of DVT (deep vein thrombosis)    History of pulmonary embolism    Multiple, status post IVC filter, on Coumadin   Mixed hyperlipidemia    Obstructive sleep apnea    NPSG 05-29-07; AHI 32.9,cpap 7 to 9   On home oxygen therapy    2 liters at night- usual- no recent changes   Osteoarthritis    Persistent atrial fibrillation [facility])    Documented October 2016   Transfusion history    Last transfusion after umbilical hernia- [facility] hospital    Family History  Problem Relation Age of Onset   Cancer Mother        Thyroid cancer   Heart disease Father        Heart attack   Allergies Other    Asthma Other    COPD Other     Past Surgical History:  Procedure Laterality Date   CHOLECYSTECTOMY     COLONOSCOPY WITH PROPOFOL N/A 06/13/2015   Procedure: COLONOSCOPY WITH PROPOFOL;  Surgeon: Garlan Fair, MD;  Location: [facility];  Service: Endoscopy;  Laterality: N/A;   ENDOMETRIAL ABLATION     GANGLION CYST EXCISION     Hernia abscess     HERNIA REPAIR     umbilical   I&D hernia incisional abscess     IR ABLATE LIVER CRYOABLATION  05/12/2019   IR RADIOLOGIST EVAL & MGMT  05/10/2019   IVC filter Right    LEFT HEART CATHETERIZATION WITH CORONARY ANGIOGRAM N/A 08/20/2013   Procedure: LEFT HEART CATHETERIZATION WITH CORONARY ANGIOGRAM;  Surgeon: Blane Ohara, MD;  Location: [facility] CATH LAB;  Service: Cardiovascular;  Laterality: N/A;   ROTATOR CUFF REPAIR     Social History   Occupational History   Occupation: Disability  Tobacco Use   Smoking status: Never    Smokeless tobacco: Never   Tobacco comments:    only as a teen for 6 months  Vaping Use   Vaping Use: Never used  Substance and Sexual Activity   Alcohol use: No    Alcohol/week: 0.0 standard drinks   Drug use: No   Sexual activity: Not on file

## 2021-02-09 DIAGNOSIS — Z1231 Encounter for screening mammogram for malignant neoplasm of breast: Secondary | ICD-10-CM | POA: Diagnosis not present

## 2021-02-12 ENCOUNTER — Other Ambulatory Visit: Payer: Self-pay | Admitting: Cardiology

## 2021-03-05 DIAGNOSIS — Z7901 Long term (current) use of anticoagulants: Secondary | ICD-10-CM | POA: Diagnosis not present

## 2021-03-14 DIAGNOSIS — I5032 Chronic diastolic (congestive) heart failure: Secondary | ICD-10-CM | POA: Diagnosis not present

## 2021-03-14 DIAGNOSIS — I10 Essential (primary) hypertension: Secondary | ICD-10-CM | POA: Diagnosis not present

## 2021-03-14 DIAGNOSIS — J4540 Moderate persistent asthma, uncomplicated: Secondary | ICD-10-CM | POA: Diagnosis not present

## 2021-03-14 DIAGNOSIS — E7800 Pure hypercholesterolemia, unspecified: Secondary | ICD-10-CM | POA: Diagnosis not present

## 2021-03-15 DIAGNOSIS — H26492 Other secondary cataract, left eye: Secondary | ICD-10-CM | POA: Diagnosis not present

## 2021-04-03 DIAGNOSIS — Z7901 Long term (current) use of anticoagulants: Secondary | ICD-10-CM | POA: Diagnosis not present

## 2021-04-12 DIAGNOSIS — I5032 Chronic diastolic (congestive) heart failure: Secondary | ICD-10-CM | POA: Diagnosis not present

## 2021-04-12 DIAGNOSIS — J4520 Mild intermittent asthma, uncomplicated: Secondary | ICD-10-CM | POA: Diagnosis not present

## 2021-04-12 DIAGNOSIS — E7800 Pure hypercholesterolemia, unspecified: Secondary | ICD-10-CM | POA: Diagnosis not present

## 2021-04-12 DIAGNOSIS — I10 Essential (primary) hypertension: Secondary | ICD-10-CM | POA: Diagnosis not present

## 2021-04-20 NOTE — Progress Notes (Signed)
HPI ?F followed for OSA, OHS, morbid obesity, chronic hypoxic respiratory failure, complicated by hx DVT/ PE/warfarin, asthma/ bronchitis, GERD ?PFT 04/12/2011-normal spirometry flows within significant response to bronchodilator. FEV1/FVC 0.79. Mild restriction and moderate diffusion reduction both may be do to her obesity. ?PFT: 02/10/2014-obstructive and restrictive changes and reduction of diffusion best explained by her obesity/hypoventilation syndrome ?---------------------------------------------------------------------- ? ? ?04/20/20- 71 year old female never smoker followed for OSA, OHS, chronic hypoxic respiratory failure, morbid obesity, complicated by history DVT/PE/warfarin, asthma/bronchitis, GERD, P A. Fib ?CPAP auto 4-12 and O2 3L sleep/ [employer] ?Download-compliance 100%, AHI 0.4/ hr ?Body weight today-308 lbs ?Covid vax-none ?Flu vax-none ?Echo 02/15/20- EF 60-65%, LAE ?Very comfortable with her CPAP. Reviewed download.  ?Denies needing inhalers. Got a little tight one day after exposure to strong odor. Found that breathing "essential oil" helped her quickly.  ?She is working to get her weight down so she can have knee surgery. Was 357 last time here and she says overall has lost about a hundred pounds.  ? ?34/3/23- 71 year old female never smoker followed for OSA, OHS, chronic hypoxic respiratory failure, morbid obesity, complicated by history DVT/PE/warfarin, asthma/bronchitis, GERD, P A. Fib ?CPAP auto 4-12 / [facility] ?Quit using O2 for sleep 5 months ago ?Download-compliance  100%, AHI 0.3/ hr ?Body weight today-272 lbs    Has lost 30 more lbs. ?Covid vax-none ?Flu vax-none ?Working with her daughter to lose weight. No acute events. Weight loss has slowed lately.  ? ?ROS-see HPI  + = positive ?Constitutional:   +dieting weight loss, night sweats, fevers, chills, fatigue, lassitude. ?HEENT:   No-  headaches, difficulty swallowing, tooth/dental problems, sore throat,  ?     No-  sneezing,  itching, ear ache, nasal congestion, post nasal drip,  ?CV:  No-   chest pain, orthopnea, PND, swelling in lower extremities, anasarca,  dizziness, palpitations ?Resp: + shortness of breath with exertion or at rest.   ?             productive cough,  non-productive cough,  No- coughing up of blood.   ?            change in color of mucus.  No- wheezing.   ?Skin: No-   rash or lesions. ?GI:  No-   heartburn, indigestion, abdominal pain, nausea, vomiting,  ?GU:  ?MS:  No-   joint pain or swelling.   ?Neuro-     nothing unusual ?Psych:  No- change in mood or affect. No depression or anxiety.  No memory loss. ? ?OBJ- Physical Exam ?General- Alert, Oriented, Affect-appropriate, Distress- none acute. +still obese ?Skin- rash-none, lesions- none, excoriation- none ?Lymphadenopathy- none ?Head- atraumatic ?           Eyes- Gross vision intact, PERRLA, conjunctivae and secretions clear ?           Ears- Hearing, canals-normal ?           Nose- Clear, no-Septal dev, mucus, polyps, erosion, perforation  ?           Throat- Mallampati II-III , mucosa clear , drainage- none, tonsils- atrophic. No-hoarseness, throat clearing ?Neck- flexible , trachea midline, no stridor , thyroid nl, carotid no bruit ?Chest - symmetrical excursion , unlabored ?          Heart/CV- RRR , no murmur , no gallop  , no rub, nl s1 s2 ?                          -  JVD- none , edema-+1, stasis changes+chronic, varices- none ?          Lung- +raspy, wheeze- none, cough- none, dullness-none, rub- none. Shallow c/w habitus ?          Chest wall-  ?Abd- ?Br/ Gen/ Rectal- Not done, not indicated ?Extrem- +rolling walker ?Neuro- grossly intact to observation ? ? ? ? ? ?

## 2021-04-23 ENCOUNTER — Ambulatory Visit (INDEPENDENT_AMBULATORY_CARE_PROVIDER_SITE_OTHER): Payer: PPO | Admitting: Internal Medicine

## 2021-04-23 ENCOUNTER — Encounter: Payer: Self-pay | Admitting: Internal Medicine

## 2021-04-23 VITALS — BP 122/78 | HR 59 | Temp 97.5°F | Ht 66.5 in | Wt 272.0 lb

## 2021-04-23 DIAGNOSIS — G4733 Obstructive sleep apnea (adult) (pediatric): Secondary | ICD-10-CM

## 2021-04-23 NOTE — Patient Instructions (Signed)
Order- [facility]- continue CPAP auto 4-12. Please replace mask of choice, hoses, supplies. Continue AirView/card ? ?Please call if we can help ?

## 2021-04-24 ENCOUNTER — Encounter: Payer: Self-pay | Admitting: Internal Medicine

## 2021-04-30 DIAGNOSIS — H35033 Hypertensive retinopathy, bilateral: Secondary | ICD-10-CM | POA: Diagnosis not present

## 2021-04-30 DIAGNOSIS — H35371 Puckering of macula, right eye: Secondary | ICD-10-CM | POA: Diagnosis not present

## 2021-04-30 DIAGNOSIS — H43391 Other vitreous opacities, right eye: Secondary | ICD-10-CM | POA: Diagnosis not present

## 2021-04-30 DIAGNOSIS — H43813 Vitreous degeneration, bilateral: Secondary | ICD-10-CM | POA: Diagnosis not present

## 2021-05-01 DIAGNOSIS — Z7901 Long term (current) use of anticoagulants: Secondary | ICD-10-CM | POA: Diagnosis not present

## 2021-05-07 ENCOUNTER — Ambulatory Visit: Payer: PPO | Admitting: Orthopaedic Surgery

## 2021-05-07 VITALS — Ht 66.5 in | Wt 276.0 lb

## 2021-05-07 DIAGNOSIS — M25562 Pain in left knee: Secondary | ICD-10-CM | POA: Diagnosis not present

## 2021-05-07 DIAGNOSIS — G8929 Other chronic pain: Secondary | ICD-10-CM | POA: Diagnosis not present

## 2021-05-07 DIAGNOSIS — M25561 Pain in right knee: Secondary | ICD-10-CM | POA: Diagnosis not present

## 2021-05-07 MED ORDER — LIDOCAINE HCL 1 % IJ SOLN
3.0000 mL | INTRAMUSCULAR | Status: AC | PRN
  Administered 2021-05-07: 3 mL

## 2021-05-07 MED ORDER — METHYLPREDNISOLONE ACETATE 40 MG/ML IJ SUSP
40.0000 mg | INTRAMUSCULAR | Status: AC | PRN
  Administered 2021-05-07: 40 mg via INTRA_ARTICULAR

## 2021-05-07 NOTE — Progress Notes (Signed)
? ?Office Visit Note ?  ?Patient: Rhonda Acevedo           ?Date of Birth: [birth year]           ?MRN: [id number] ?Visit Date: 05/07/2021 ?             ?Requested by: Wenda Low, MD ?[address] ?[address] ?[place],  [place] [address] ?PCP: Wenda Low, MD ? ? ?Assessment & Plan: ?Visit Diagnoses:  ?1. Chronic pain of left knee   ?2. Chronic pain of right knee   ?3. Severe obesity (BMI >= 40) (HCC)   ? ? ?Plan:  The patient meets the AMA guidelines for Morbid (severe) obesity with a BMI > 40.0 and I have recommended weight loss.  Per the patient's request I did place a steroid injection in both knees that she tolerated well.  She knows that we can do this again in 3 months if needed.  I did talk to her about weight loss. ? ?Follow-Up Instructions: Return if symptoms worsen or fail to improve.  ? ?Orders:  ?Orders Placed This Encounter  ?Procedures  ? Large Joint Inj  ? Large Joint Inj  ? ?No orders of the defined types were placed in this encounter. ? ? ? ? Procedures: ?Large Joint Inj: R knee on 05/07/2021 2:47 PM ?Indications: diagnostic evaluation and pain ?Details: 22 G 1.5 in needle, superolateral approach ? ?Arthrogram: No ? ?Medications: 3 mL lidocaine 1 %; 40 mg methylPREDNISolone acetate 40 MG/ML ?Outcome: tolerated well, no immediate complications ?Procedure, treatment alternatives, risks and benefits explained, specific risks discussed. Consent was given by the patient. Immediately prior to procedure a time out was called to verify the correct patient, procedure, equipment, support staff and site/side marked as required. Patient was prepped and draped in the usual sterile fashion.  ? ? ?Large Joint Inj: L knee on 05/07/2021 2:47 PM ?Indications: diagnostic evaluation and pain ?Details: 22 G 1.5 in needle, superolateral approach ? ?Arthrogram: No ? ?Medications: 3 mL lidocaine 1 %; 40 mg methylPREDNISolone acetate 40 MG/ML ?Outcome: tolerated well, no immediate complications ?Procedure, treatment  alternatives, risks and benefits explained, specific risks discussed. Consent was given by the patient. Immediately prior to procedure a time out was called to verify the correct patient, procedure, equipment, support staff and site/side marked as required. Patient was prepped and draped in the usual sterile fashion.  ? ? ? ? ?Clinical Data: ?No additional findings. ? ? ?Subjective: ?Chief Complaint  ?Patient presents with  ? Left Knee - Pain  ? Right Knee - Pain  ?The patient comes in today requesting steroid injections in both her knees.  She has known osteoarthritis of both knees and is not a candidate for surgery.  Her BMI today is 43.88 and that has not really changed in the last 3 months.  She says the injections last about 2 months.  She ambulates with a rolling walker.  She is scheduled to have some type of retinal surgery next week.  She is not a diabetic.  She denies any other acute change in her medical status.  She mobilizes very slowly.  She has well-documented severe arthritis of both her knees. ? ?HPI ? ?Review of Systems ?She denies any fever, chills, nausea, vomiting ? ?Objective: ?Vital Signs: Ht 5' 6.5" (1.689 m)   Wt 276 lb (125.2 kg)   BMI 43.88 kg/m?  ? ?Physical Exam ?She is alert and orient x3 and in no acute distress ?Ortho Exam ?Examination of both  knees shows a large soft tissue envelope around both knees.  She does have venous stasis changes in her legs but no open wounds.  She has global tenderness throughout the arc of motion of both knees with patellofemoral crepitation. ?Specialty Comments:  ?No specialty comments available. ? ?Imaging: ?No results found. ? ? ?PMFS History: ?Patient Active Problem List  ? Diagnosis Date Noted  ? Unilateral primary osteoarthritis, left knee 09/20/2019  ? Unilateral primary osteoarthritis, right knee 09/20/2019  ? Morbid obesity due to excess calories ([place]) 05/23/2017  ? Pulmonary embolism ([place]) 08/27/2013  ? Exertional dyspnea 08/17/2013  ? Chronic  respiratory failure with hypoxia ([place]) 03/27/2010  ? HYPERLIPIDEMIA 05/03/2007  ? Essential hypertension, benign 05/03/2007  ? Asthma, non-allergic 05/03/2007  ? GERD 05/03/2007  ? Obesity hypoventilation syndrome ([place]) 04/28/2007  ? Bronchitis, acute 04/28/2007  ? Obstructive sleep apnea 04/28/2007  ? ?Past Medical History:  ?Diagnosis Date  ? Allergic rhinitis   ? Esophageal reflux   ? Essential hypertension   ? Factor V Leiden mutation ([place])   ? GERD (gastroesophageal reflux disease)   ? History of cardiac catheterization   ? Normal coronaries July 2015  ? History of DVT (deep vein thrombosis)   ? History of pulmonary embolism   ? Multiple, status post IVC filter, on Coumadin  ? Mixed hyperlipidemia   ? Obstructive sleep apnea   ? NPSG 05-29-07; AHI 32.9,cpap 7 to 9  ? On home oxygen therapy   ? 2 liters at night- usual- no recent changes  ? Osteoarthritis   ? Persistent atrial fibrillation ([place])   ? Documented October 2016  ? Transfusion history   ? [facility]  ?  ?Family History  ?Problem Relation Age of Onset  ? Cancer Mother   ?     Thyroid cancer  ? Heart disease Father   ?     Heart attack  ? Allergies Other   ? Asthma Other   ? COPD Other   ?  ?Past Surgical History:  ?Procedure Laterality Date  ? CHOLECYSTECTOMY    ? COLONOSCOPY WITH PROPOFOL N/A 06/13/2015  ? Procedure: COLONOSCOPY WITH PROPOFOL;  Surgeon: Garlan Fair, MD;  Location: [facility] ENDOSCOPY;  Service: Endoscopy;  Laterality: N/A;  ? ENDOMETRIAL ABLATION    ? GANGLION CYST EXCISION    ? Hernia abscess    ? HERNIA REPAIR    ? umbilical  ? I&D hernia incisional abscess    ? IR ABLATE LIVER CRYOABLATION  05/12/2019  ? IR RADIOLOGIST EVAL & MGMT  05/10/2019  ? IVC filter Right   ? LEFT HEART CATHETERIZATION WITH CORONARY ANGIOGRAM N/A 08/20/2013  ? Procedure: LEFT HEART CATHETERIZATION WITH CORONARY ANGIOGRAM;  Surgeon: Blane Ohara, MD;  Location: [facility] CATH LAB;  Service: Cardiovascular;  Laterality: N/A;   ? ROTATOR CUFF REPAIR    ? ?Social History  ? ?Occupational History  ? Occupation: Disability  ?Tobacco Use  ? Smoking status: Never  ? Smokeless tobacco: Never  ? Tobacco comments:  ?  only as a teen for 6 months  ?Vaping Use  ? Vaping Use: Never used  ?Substance and Sexual Activity  ? Alcohol use: No  ?  Alcohol/week: 0.0 standard drinks  ? Drug use: No  ? Sexual activity: Not on file  ? ? ? ? ? ? ?

## 2021-05-14 DIAGNOSIS — K219 Gastro-esophageal reflux disease without esophagitis: Secondary | ICD-10-CM | POA: Diagnosis not present

## 2021-05-14 DIAGNOSIS — R7303 Prediabetes: Secondary | ICD-10-CM | POA: Diagnosis not present

## 2021-05-14 DIAGNOSIS — E7800 Pure hypercholesterolemia, unspecified: Secondary | ICD-10-CM | POA: Diagnosis not present

## 2021-05-14 DIAGNOSIS — Z86711 Personal history of pulmonary embolism: Secondary | ICD-10-CM | POA: Diagnosis not present

## 2021-05-14 DIAGNOSIS — I509 Heart failure, unspecified: Secondary | ICD-10-CM | POA: Diagnosis not present

## 2021-05-14 DIAGNOSIS — M179 Osteoarthritis of knee, unspecified: Secondary | ICD-10-CM | POA: Diagnosis not present

## 2021-05-14 DIAGNOSIS — I4891 Unspecified atrial fibrillation: Secondary | ICD-10-CM | POA: Diagnosis not present

## 2021-05-14 DIAGNOSIS — J4540 Moderate persistent asthma, uncomplicated: Secondary | ICD-10-CM | POA: Diagnosis not present

## 2021-05-14 DIAGNOSIS — I5032 Chronic diastolic (congestive) heart failure: Secondary | ICD-10-CM | POA: Diagnosis not present

## 2021-05-14 DIAGNOSIS — D510 Vitamin B12 deficiency anemia due to intrinsic factor deficiency: Secondary | ICD-10-CM | POA: Diagnosis not present

## 2021-05-14 DIAGNOSIS — Z0000 Encounter for general adult medical examination without abnormal findings: Secondary | ICD-10-CM | POA: Diagnosis not present

## 2021-05-14 DIAGNOSIS — I10 Essential (primary) hypertension: Secondary | ICD-10-CM | POA: Diagnosis not present

## 2021-05-16 DIAGNOSIS — H35341 Macular cyst, hole, or pseudohole, right eye: Secondary | ICD-10-CM | POA: Diagnosis not present

## 2021-05-16 DIAGNOSIS — H35371 Puckering of macula, right eye: Secondary | ICD-10-CM | POA: Diagnosis not present

## 2021-05-20 NOTE — Assessment & Plan Note (Signed)
Benefits from CPAP. Ongoing weight loss also helps. ?Plan- replace supplies. Continue auto 4-12 ?

## 2021-05-20 NOTE — Assessment & Plan Note (Signed)
Pleased with her progress and encouraged to keep at it with daughter's help ?

## 2021-05-24 DIAGNOSIS — H35371 Puckering of macula, right eye: Secondary | ICD-10-CM | POA: Diagnosis not present

## 2021-05-24 DIAGNOSIS — H35341 Macular cyst, hole, or pseudohole, right eye: Secondary | ICD-10-CM | POA: Diagnosis not present

## 2021-05-29 DIAGNOSIS — Z7901 Long term (current) use of anticoagulants: Secondary | ICD-10-CM | POA: Diagnosis not present

## 2021-06-11 DIAGNOSIS — D510 Vitamin B12 deficiency anemia due to intrinsic factor deficiency: Secondary | ICD-10-CM | POA: Diagnosis not present

## 2021-06-11 DIAGNOSIS — I10 Essential (primary) hypertension: Secondary | ICD-10-CM | POA: Diagnosis not present

## 2021-06-11 DIAGNOSIS — J4520 Mild intermittent asthma, uncomplicated: Secondary | ICD-10-CM | POA: Diagnosis not present

## 2021-06-11 DIAGNOSIS — I5032 Chronic diastolic (congestive) heart failure: Secondary | ICD-10-CM | POA: Diagnosis not present

## 2021-06-11 DIAGNOSIS — K219 Gastro-esophageal reflux disease without esophagitis: Secondary | ICD-10-CM | POA: Diagnosis not present

## 2021-06-11 DIAGNOSIS — E7800 Pure hypercholesterolemia, unspecified: Secondary | ICD-10-CM | POA: Diagnosis not present

## 2021-06-11 DIAGNOSIS — G8929 Other chronic pain: Secondary | ICD-10-CM | POA: Diagnosis not present

## 2021-06-14 DIAGNOSIS — H43812 Vitreous degeneration, left eye: Secondary | ICD-10-CM | POA: Diagnosis not present

## 2021-06-28 DIAGNOSIS — Z7901 Long term (current) use of anticoagulants: Secondary | ICD-10-CM | POA: Diagnosis not present

## 2021-07-10 ENCOUNTER — Ambulatory Visit: Payer: PPO | Admitting: Podiatry

## 2021-07-10 DIAGNOSIS — L509 Urticaria, unspecified: Secondary | ICD-10-CM | POA: Insufficient documentation

## 2021-07-10 DIAGNOSIS — D689 Coagulation defect, unspecified: Secondary | ICD-10-CM | POA: Diagnosis not present

## 2021-07-10 DIAGNOSIS — D509 Iron deficiency anemia, unspecified: Secondary | ICD-10-CM | POA: Insufficient documentation

## 2021-07-10 DIAGNOSIS — E7800 Pure hypercholesterolemia, unspecified: Secondary | ICD-10-CM | POA: Insufficient documentation

## 2021-07-10 DIAGNOSIS — B351 Tinea unguium: Secondary | ICD-10-CM

## 2021-07-10 DIAGNOSIS — D2371 Other benign neoplasm of skin of right lower limb, including hip: Secondary | ICD-10-CM

## 2021-07-10 DIAGNOSIS — M79676 Pain in unspecified toe(s): Secondary | ICD-10-CM | POA: Diagnosis not present

## 2021-07-10 DIAGNOSIS — D2372 Other benign neoplasm of skin of left lower limb, including hip: Secondary | ICD-10-CM

## 2021-07-10 DIAGNOSIS — J45909 Unspecified asthma, uncomplicated: Secondary | ICD-10-CM | POA: Insufficient documentation

## 2021-07-10 DIAGNOSIS — I119 Hypertensive heart disease without heart failure: Secondary | ICD-10-CM | POA: Insufficient documentation

## 2021-07-10 DIAGNOSIS — D649 Anemia, unspecified: Secondary | ICD-10-CM | POA: Insufficient documentation

## 2021-07-10 DIAGNOSIS — D510 Vitamin B12 deficiency anemia due to intrinsic factor deficiency: Secondary | ICD-10-CM | POA: Insufficient documentation

## 2021-07-10 DIAGNOSIS — M1990 Unspecified osteoarthritis, unspecified site: Secondary | ICD-10-CM | POA: Insufficient documentation

## 2021-07-10 NOTE — Progress Notes (Signed)
Subjective:  Patient ID: Rhonda Acevedo, female    DOB: February 01, 1950,  MRN: [id number] HPI No chief complaint on file.   71 y.o. female presents with the above complaint.   ROS: Denies fever chills nausea vomiting muscle aches pains calf pain back pain chest pain shortness of breath.  Past Medical History:  Diagnosis Date   Allergic rhinitis    Esophageal reflux    Essential hypertension    Factor V Leiden mutation ([place])    GERD (gastroesophageal reflux disease)    History of cardiac catheterization    Normal coronaries July 2015   History of DVT (deep vein thrombosis)    History of pulmonary embolism    Multiple, status post IVC filter, on Coumadin   Mixed hyperlipidemia    Obstructive sleep apnea    NPSG 05-29-07; AHI 32.9,cpap 7 to 9   On home oxygen therapy    2 liters at night- usual- no recent changes   Osteoarthritis    Persistent atrial fibrillation [facility])    Documented October 2016   Transfusion history    Last transfusion after umbilical hernia- [facility]   Past Surgical History:  Procedure Laterality Date   CHOLECYSTECTOMY     COLONOSCOPY WITH PROPOFOL N/A 06/13/2015   Procedure: COLONOSCOPY WITH PROPOFOL;  Surgeon: Garlan Fair, MD;  Location: [facility] ENDOSCOPY;  Service: Endoscopy;  Laterality: N/A;   ENDOMETRIAL ABLATION     GANGLION CYST EXCISION     Hernia abscess     HERNIA REPAIR     umbilical   I&D hernia incisional abscess     IR ABLATE LIVER CRYOABLATION  05/12/2019   IR RADIOLOGIST EVAL & MGMT  05/10/2019   IVC filter Right    LEFT HEART CATHETERIZATION WITH CORONARY ANGIOGRAM N/A 08/20/2013   Procedure: LEFT HEART CATHETERIZATION WITH CORONARY ANGIOGRAM;  Surgeon: Blane Ohara, MD;  Location[facility] C[facility] LAB;  Service: Cardiovascular;  Laterality: N/A;   ROTATOR CUFF REPAIR      Current Outpatient Medications:    flecainide (TAMBOCOR) 50 MG tablet, Take 1 tablet by mouth twice daily, Disp: 180 tablet, Rfl: 1   furosemide (LASIX) 20 MG  tablet, Take 20 mg by mouth 2 (two) times daily., Disp: , Rfl:    hydrOXYzine (ATARAX/VISTARIL) 25 MG tablet, Take 25 mg by mouth daily., Disp: , Rfl:    oxybutynin (DITROPAN) 5 MG tablet, Take 5 mg by mouth 2 (two) times daily., Disp: , Rfl:    pravastatin (PRAVACHOL) 80 MG tablet, Take 80 mg by mouth at bedtime. , Disp: , Rfl:    traMADol (ULTRAM) 50 MG tablet, Take 50 mg by mouth 3 (three) times daily as needed., Disp: , Rfl:    triamcinolone ointment (KENALOG) 0.5 %, Apply topically 2 (two) times daily as needed., Disp: , Rfl:    warfarin (COUMADIN) 10 MG tablet, Take 10 mg by mouth See admin instructions. Take one tablet daily except none of Friday., Disp: , Rfl:   Allergies  Allergen Reactions   Adhesive [Tape] Itching and Rash   Benazepril Hcl Cough   Review of Systems Objective:  There were no vitals filed for this visit.  General: Well developed, nourished, in no acute distress, alert and oriented x3   Dermatological: Skin is warm, dry and supple bilateral. Nails x 10 are well maintained; remaining integument appears unremarkable at this time. There are no open sores, no preulcerative lesions, no rash or signs of infection present.  Thick callus benign skin lesion subfifth  metatarsal base bilaterally.  Vascular: Dorsalis Pedis artery and Posterior Tibial artery pedal pulses are 2/4 bilateral with immedate capillary fill time. Pedal hair growth present. No varicosities and no lower extremity edema present bilateral.   Neruologic: Grossly intact via light touch bilateral. Vibratory intact via tuning fork bilateral. Protective threshold with Semmes Wienstein monofilament intact to all pedal sites bilateral. Patellar and Achilles deep tendon reflexes 2+ bilateral. No Babinski or clonus noted bilateral.   Musculoskeletal: No gross boney pedal deformities bilateral. No pain, crepitus, or limitation noted with foot and ankle range of motion bilateral. Muscular strength 5/5 in all groups  tested bilateral.  Severe osteoarthritic change with hallux interphalangeal left great toe  Gait: Unassisted, Nonantalgic.    Radiographs:  None taken  Assessment & Plan:   Assessment: Painful mycotic nails hammertoe deformities bilateral and benign skin lesions plantar aspect of the bilateral foot multiple.  Plan: Debridement of nails 1 through 5 bilaterally.  Debridement of benign skin lesions bilaterally.     Rhonda Acevedo, [place]

## 2021-07-15 ENCOUNTER — Encounter: Payer: Self-pay | Admitting: Podiatry

## 2021-07-27 DIAGNOSIS — Z7901 Long term (current) use of anticoagulants: Secondary | ICD-10-CM | POA: Diagnosis not present

## 2021-08-06 ENCOUNTER — Encounter: Payer: Self-pay | Admitting: Orthopaedic Surgery

## 2021-08-06 ENCOUNTER — Ambulatory Visit: Payer: PPO | Admitting: Orthopaedic Surgery

## 2021-08-06 DIAGNOSIS — M25562 Pain in left knee: Secondary | ICD-10-CM | POA: Diagnosis not present

## 2021-08-06 DIAGNOSIS — G8929 Other chronic pain: Secondary | ICD-10-CM | POA: Diagnosis not present

## 2021-08-06 DIAGNOSIS — M25561 Pain in right knee: Secondary | ICD-10-CM | POA: Diagnosis not present

## 2021-08-06 MED ORDER — METHYLPREDNISOLONE ACETATE 40 MG/ML IJ SUSP
40.0000 mg | INTRAMUSCULAR | Status: AC | PRN
  Administered 2021-08-06: 40 mg via INTRA_ARTICULAR

## 2021-08-06 MED ORDER — LIDOCAINE HCL 1 % IJ SOLN
3.0000 mL | INTRAMUSCULAR | Status: AC | PRN
  Administered 2021-08-06: 3 mL

## 2021-08-06 NOTE — Progress Notes (Signed)
Office Visit Note   Patient: Rhonda Acevedo           Date of Birth: Jun 06, 1950           MRN: [id number] Visit Date: 08/06/2021              Requested by: Wenda Low, MD [address] [place],  [place] [address] PCP: Wenda Low, MD   Assessment & Plan: Visit Diagnoses:  1. Chronic pain of left knee   2. Chronic pain of right knee     Plan: I did place a steroid injection in both knees today and counseled her about this.  I also counseled again about weight loss.  She knows to wait at least 3 to 4 months between steroid injections in her knees.  If she does come in again for steroid injections, I would like to repeat weight and BMI calculation.  Follow-Up Instructions: Return if symptoms worsen or fail to improve.   Orders:  Orders Placed This Encounter  Procedures   Large Joint Inj   Large Joint Inj   No orders of the defined types were placed in this encounter.     Procedures: Large Joint Inj: R knee on 08/06/2021 1:25 PM Indications: diagnostic evaluation and pain Details: 22 G 1.5 in needle, superolateral approach  Arthrogram: No  Medications: 3 mL lidocaine 1 %; 40 mg methylPREDNISolone acetate 40 MG/ML Outcome: tolerated well, no immediate complications Procedure, treatment alternatives, risks and benefits explained, specific risks discussed. Consent was given by the patient. Immediately prior to procedure a time out was called to verify the correct patient, procedure, equipment, support staff and site/side marked as required. Patient was prepped and draped in the usual sterile fashion.    Large Joint Inj: L knee on 08/06/2021 1:25 PM Indications: diagnostic evaluation and pain Details: 22 G 1.5 in needle, superolateral approach  Arthrogram: No  Medications: 3 mL lidocaine 1 %; 40 mg methylPREDNISolone acetate 40 MG/ML Outcome: tolerated well, no immediate complications Procedure, treatment alternatives, risks and benefits explained,  specific risks discussed. Consent was given by the patient. Immediately prior to procedure a time out was called to verify the correct patient, procedure, equipment, support staff and site/side marked as required. Patient was prepped and draped in the usual sterile fashion.       Clinical Data: No additional findings.   Subjective: Chief Complaint  Patient presents with   Right Knee - Pain   Left Knee - Pain  The patient is well-known to [place].  She has significant arthritis in both her knees and chronic pain with both her knees.  She has plateaued in terms of her weight loss.  Her BMI for 2 visits in a row was down to 56 but she understands it needs to be below 40 in order to qualify for knee replacement surgery.  She is not a diabetic.  The steroid injections last for about 2 months.  Its been 3 months since her last injections.  She has had no acute change in her medical status and comes in today requesting steroid injections in both her knees.  She does ambulate with a rolling walker as well.  HPI  Review of Systems There is currently listed no fever, chills, nausea, vomiting  Objective: Vital Signs: There were no vitals taken for this visit.  Physical Exam She is alert and orient x3 and in no acute distress Ortho Exam Examination of both knees show patellofemoral crepitation and medial joint  line tenderness of both knees.  There is pain throughout the arc of motion of both knees.  She has significant soft tissue around the knees but not insurmountable for knee replacement surgery.  However both legs do have venous stasis changes.  We did not weigh her today. Specialty Comments:  No specialty comments available.  Imaging: No results found.   PMFS History: Patient Active Problem List   Diagnosis Date Noted   Absolute anemia 07/10/2021   Addison anemia 07/10/2021   Anemia, iron deficiency 07/10/2021   Asthma, extrinsic, without status asthmaticus 07/10/2021   Benign  hypertensive heart disease without CHF 07/10/2021   [place] 07/10/2021   Hypercholesterolemia without hypertriglyceridemia 07/10/2021   Osteoarthritis 07/10/2021   Unilateral primary osteoarthritis, left knee 09/20/2019   Unilateral primary osteoarthritis, right knee 09/20/2019   Pain in left knee 12/23/2017   Morbid obesity due to excess calories ([place]) 05/23/2017   Pulmonary embolism ([place]) 08/27/2013   Exertional dyspnea 08/17/2013   Monitoring for anticoagulant use 08/13/2013   S/P endometrial ablation 07/05/2013   Colon polyp 02/28/2011   History of pregnancy 02/28/2011   Homozygous Factor V Leiden mutation ([place]) 02/28/2011   Multilevel degenerative disc disease 02/28/2011   Recurrent ventral hernia 02/28/2011   Chronic respiratory failure with hypoxia ([place]) 03/27/2010   HYPERLIPIDEMIA 05/03/2007   Essential hypertension, benign 05/03/2007   Asthma, non-allergic 05/03/2007   GERD 05/03/2007   Obesity hypoventilation syndrome ([place]) 04/28/2007   Bronchitis, acute 04/28/2007   Obstructive sleep apnea 04/28/2007   Past Medical History:  Diagnosis Date   Allergic rhinitis    Esophageal reflux    Essential hypertension    Factor V Leiden mutation ([place])    GERD (gastroesophageal reflux disease)    History of cardiac catheterization    Normal coronaries July 2015   History of DVT (deep vein thrombosis)    History of pulmonary embolism    Multiple, status post IVC filter, on Coumadin   Mixed hyperlipidemia    Obstructive sleep apnea    NPSG 05-29-07; AHI 32.9,cpap 7 to 9   On home oxygen therapy    2 liters at night- usual- no recent changes   Osteoarthritis    Persistent atrial fibrillation [facility])    Documented October 2016   Transfusion history    Last transfusion after umbilical [facility]- [facility]    Family History  Problem Relation Age of Onset   Cancer Mother        Thyroid cancer   Heart disease Father        Heart attack   Allergies Other    Asthma Other     COPD Other     Past Surgical History:  Procedure Laterality Date   CHOLECYSTECTOMY     COLONOSCOPY WITH PROPOFOL N/A 06/13/2015   Procedure: COLONOSCOPY WITH PROPOFOL;  Surgeon: Garlan Fair, MD;  Location: [facility] ENDOSCOPY;  Service: Endoscopy;  Laterality: N/A;   ENDOMETRIAL ABLATION     GANGLION CYST EXCISION     Hernia abscess     HERNIA REPAIR     umbilical   I&D hernia incisional abscess     IR ABLATE LIVER CRYOABLATION  05/12/2019   IR RADIOLOGIST EVAL & MGMT  05/10/2019   IVC filter Right    LEFT HEART CATHETERIZATION WITH CORONARY ANGIOGRAM N/A 08/20/2013   Procedure: LEFT HEART CATHETERIZATION WITH CORONARY ANGIOGRAM;  Surgeon: Blane Ohara, MD;  Location: [facility] CATH LAB;  Service: Cardiovascular;  Laterality: N/A;   ROTATOR CUFF REPAIR  Social History   Occupational History   Occupation: Disability  Tobacco Use   Smoking status: Never   Smokeless tobacco: Never   Tobacco comments:    only as a teen for 6 months  Vaping Use   Vaping Use: Never used  Substance and Sexual Activity   Alcohol use: No    Alcohol/week: 0.0 standard drinks of alcohol   Drug use: No   Sexual activity: Not on file

## 2021-08-13 ENCOUNTER — Other Ambulatory Visit: Payer: Self-pay | Admitting: Cardiology

## 2021-08-21 DIAGNOSIS — Z888 Allergy status to other drugs, medicaments and biological substances status: Secondary | ICD-10-CM | POA: Diagnosis not present

## 2021-08-21 DIAGNOSIS — R9389 Abnormal findings on diagnostic imaging of other specified body structures: Secondary | ICD-10-CM | POA: Diagnosis not present

## 2021-08-21 DIAGNOSIS — Z79899 Other long term (current) drug therapy: Secondary | ICD-10-CM | POA: Diagnosis not present

## 2021-08-21 DIAGNOSIS — Z803 Family history of malignant neoplasm of breast: Secondary | ICD-10-CM | POA: Insufficient documentation

## 2021-08-21 DIAGNOSIS — Z9189 Other specified personal risk factors, not elsewhere classified: Secondary | ICD-10-CM | POA: Diagnosis not present

## 2021-08-21 DIAGNOSIS — Z9889 Other specified postprocedural states: Secondary | ICD-10-CM | POA: Diagnosis not present

## 2021-08-21 DIAGNOSIS — I4891 Unspecified atrial fibrillation: Secondary | ICD-10-CM | POA: Diagnosis not present

## 2021-08-21 DIAGNOSIS — Z7901 Long term (current) use of anticoagulants: Secondary | ICD-10-CM | POA: Diagnosis not present

## 2021-08-21 DIAGNOSIS — I498 Other specified cardiac arrhythmias: Secondary | ICD-10-CM | POA: Diagnosis not present

## 2021-08-21 DIAGNOSIS — Z8719 Personal history of other diseases of the digestive system: Secondary | ICD-10-CM | POA: Diagnosis not present

## 2021-08-21 DIAGNOSIS — Z6841 Body Mass Index (BMI) 40.0 and over, adult: Secondary | ICD-10-CM | POA: Diagnosis not present

## 2021-08-21 DIAGNOSIS — N3281 Overactive bladder: Secondary | ICD-10-CM | POA: Diagnosis not present

## 2021-08-21 DIAGNOSIS — Z8679 Personal history of other diseases of the circulatory system: Secondary | ICD-10-CM | POA: Diagnosis not present

## 2021-08-27 DIAGNOSIS — Z7901 Long term (current) use of anticoagulants: Secondary | ICD-10-CM | POA: Diagnosis not present

## 2021-09-03 DIAGNOSIS — H00012 Hordeolum externum right lower eyelid: Secondary | ICD-10-CM | POA: Diagnosis not present

## 2021-09-03 DIAGNOSIS — H43812 Vitreous degeneration, left eye: Secondary | ICD-10-CM | POA: Diagnosis not present

## 2021-09-03 DIAGNOSIS — H35033 Hypertensive retinopathy, bilateral: Secondary | ICD-10-CM | POA: Diagnosis not present

## 2021-09-06 ENCOUNTER — Other Ambulatory Visit: Payer: Self-pay | Admitting: Cardiology

## 2021-09-25 DIAGNOSIS — Z7901 Long term (current) use of anticoagulants: Secondary | ICD-10-CM | POA: Diagnosis not present

## 2021-10-11 ENCOUNTER — Ambulatory Visit: Payer: PPO | Admitting: Podiatry

## 2021-10-11 DIAGNOSIS — B351 Tinea unguium: Secondary | ICD-10-CM

## 2021-10-11 DIAGNOSIS — D2372 Other benign neoplasm of skin of left lower limb, including hip: Secondary | ICD-10-CM

## 2021-10-11 DIAGNOSIS — M79676 Pain in unspecified toe(s): Secondary | ICD-10-CM

## 2021-10-13 NOTE — Progress Notes (Signed)
She presents today chief complaint of bilateral foot pain as far as the nails and calluses.  Objective: Vital signs stable alert oriented x3 pulses are palpable.  Multiple benign skin lesions no open lesions or wounds no ulcerative lesions.  Toenails are long thick yellow dystrophic onychomycotic sharply incurvated painful palpation.  No signs of bacterial infection of the margins.  Assessment: Pain in limb secondary to onychomycosis benign skin lesions.  Plan: Debridement of toenails 1 through 5 bilateral debridement of all benign skin lesions.  Follow-up with her in 3 months

## 2021-10-16 DIAGNOSIS — Z7901 Long term (current) use of anticoagulants: Secondary | ICD-10-CM | POA: Diagnosis not present

## 2021-10-24 ENCOUNTER — Other Ambulatory Visit: Payer: Self-pay | Admitting: Cardiology

## 2021-11-13 DIAGNOSIS — D6869 Other thrombophilia: Secondary | ICD-10-CM | POA: Diagnosis not present

## 2021-11-13 DIAGNOSIS — I4891 Unspecified atrial fibrillation: Secondary | ICD-10-CM | POA: Diagnosis not present

## 2021-11-13 DIAGNOSIS — G4733 Obstructive sleep apnea (adult) (pediatric): Secondary | ICD-10-CM | POA: Diagnosis not present

## 2021-11-13 DIAGNOSIS — Z86711 Personal history of pulmonary embolism: Secondary | ICD-10-CM | POA: Diagnosis not present

## 2021-11-13 DIAGNOSIS — R7303 Prediabetes: Secondary | ICD-10-CM | POA: Diagnosis not present

## 2021-11-13 DIAGNOSIS — Z7901 Long term (current) use of anticoagulants: Secondary | ICD-10-CM | POA: Diagnosis not present

## 2021-11-13 DIAGNOSIS — J4520 Mild intermittent asthma, uncomplicated: Secondary | ICD-10-CM | POA: Diagnosis not present

## 2021-11-13 DIAGNOSIS — I5032 Chronic diastolic (congestive) heart failure: Secondary | ICD-10-CM | POA: Diagnosis not present

## 2021-11-13 DIAGNOSIS — E7800 Pure hypercholesterolemia, unspecified: Secondary | ICD-10-CM | POA: Diagnosis not present

## 2021-11-13 DIAGNOSIS — I10 Essential (primary) hypertension: Secondary | ICD-10-CM | POA: Diagnosis not present

## 2021-11-14 ENCOUNTER — Ambulatory Visit (INDEPENDENT_AMBULATORY_CARE_PROVIDER_SITE_OTHER): Payer: PPO | Admitting: Orthopaedic Surgery

## 2021-11-14 ENCOUNTER — Encounter: Payer: Self-pay | Admitting: Orthopaedic Surgery

## 2021-11-14 DIAGNOSIS — M1712 Unilateral primary osteoarthritis, left knee: Secondary | ICD-10-CM

## 2021-11-14 DIAGNOSIS — M1711 Unilateral primary osteoarthritis, right knee: Secondary | ICD-10-CM

## 2021-11-14 DIAGNOSIS — M170 Bilateral primary osteoarthritis of knee: Secondary | ICD-10-CM | POA: Diagnosis not present

## 2021-11-14 MED ORDER — LIDOCAINE HCL 1 % IJ SOLN
3.0000 mL | INTRAMUSCULAR | Status: AC | PRN
  Administered 2021-11-14: 3 mL

## 2021-11-14 MED ORDER — METHYLPREDNISOLONE ACETATE 40 MG/ML IJ SUSP
40.0000 mg | INTRAMUSCULAR | Status: AC | PRN
  Administered 2021-11-14: 40 mg via INTRA_ARTICULAR

## 2021-11-14 NOTE — Progress Notes (Signed)
   Procedure Note  Patient: Rhonda Acevedo             Date of Birth: [birth year]           MRN: [id number]             Visit Date: 11/14/2021 HPI: Rhonda Acevedo comes in today requesting bilateral knee injections.  She has known osteoarthritis both knees.  She denies any fevers chills.  Denies any new injuries.  States the last injections given on 08/06/2021 both knees gave her at least 50% relief for 2 months.  Review of systems: See HPI otherwise negative  Physical exam: General well-developed well-nourished female no acute distress mood and affect appropriate.  Psych alert and oriented x3.  Bilateral knees no abnormal warmth erythema or effusion.  She has good range of motion of both knees but causes significant pain.  Global tenderness bilateral knees touch.  Procedures: Visit Diagnoses:  1. Unilateral primary osteoarthritis, left knee   2. Unilateral primary osteoarthritis, right knee     Large Joint Inj: bilateral knee on 11/14/2021 11:39 AM Indications: pain Details: 22 G 1.5 in needle, anterolateral approach  Arthrogram: No  Medications (Right): 3 mL lidocaine 1 %; 40 mg methylPREDNISolone acetate 40 MG/ML Medications (Left): 3 mL lidocaine 1 %; 40 mg methylPREDNISolone acetate 40 MG/ML Outcome: tolerated well, no immediate complications Procedure, treatment alternatives, risks and benefits explained, specific risks discussed. Consent was given by the patient. Immediately prior to procedure a time out was called to verify the correct patient, procedure, equipment, support staff and site/side marked as required. Patient was prepped and draped in the usual sterile fashion.    Plan: Follow-up on an as-needed basis.  She knows to wait at least 3 months between injections.  Questions were encouraged and answered at length.

## 2021-11-26 ENCOUNTER — Other Ambulatory Visit: Payer: Self-pay | Admitting: Cardiology

## 2021-12-05 ENCOUNTER — Ambulatory Visit: Payer: PPO | Attending: Student | Admitting: Student

## 2021-12-05 ENCOUNTER — Encounter: Payer: Self-pay | Admitting: Student

## 2021-12-05 VITALS — BP 132/78 | HR 90 | Ht 66.5 in | Wt 269.8 lb

## 2021-12-05 DIAGNOSIS — E782 Mixed hyperlipidemia: Secondary | ICD-10-CM

## 2021-12-05 DIAGNOSIS — D6851 Activated protein C resistance: Secondary | ICD-10-CM | POA: Diagnosis not present

## 2021-12-05 DIAGNOSIS — I480 Paroxysmal atrial fibrillation: Secondary | ICD-10-CM | POA: Diagnosis not present

## 2021-12-05 NOTE — Patient Instructions (Signed)
Medication Instructions:  Your physician recommends that you continue on your current medications as directed. Please refer to the Current Medication list given to you today.   Labwork: None  Testing/Procedures: None  Follow-Up: Follow up with Dr. Domenic Polite in 6 month.   Any Other Special Instructions Will Be Listed Below (If Applicable).     If you need a refill on your cardiac medications before your next appointment, please call your pharmacy.

## 2021-12-05 NOTE — Progress Notes (Signed)
Cardiology Office Note    Date:  12/05/2021   ID:  Rhonda Acevedo, DOB [birth year], MRN [id number]  PCP:  Wenda Low, MD  Cardiologist: Rozann Lesches, MD    Chief Complaint  Patient presents with   Follow-up    Routine Visit    History of Present Illness:    Rhonda Acevedo is a 71 y.o. female past medical history of paroxysmal atrial fibrillation, chest pain (catheterization in 07/2013 showing normal coronary arteries), history of recurrent DVT's/PE, HLD, OSA and Factor V Leiden mutation who presents to the office today for overdue 24-monthfollow-up.  She was last examined by AKatina Dung NP in 11/2020 and reported improvement in her overall health since having lost over 50 pounds with dietary changes. She denied any recent anginal symptoms was continued on her current cardiac medications including Flecainide 50 mg twice daily, Pravastatin 80 mg daily and Coumadin.  In talking with the patient today, she reports things have overall been stable since her last office visit. Her activity is limited secondary to knee pain and she does receive injections from Orthopedics with temporary improvement in her symptoms. She denies any recent chest pain or palpitations. This weekend, she does report her heart rate was registering in the 30's to 40's but this was in the setting of nausea and vomiting due to a migraine headache. Does have baseline dyspnea on exertion. No recent orthopnea or PND. She remains on Lasix and reports this helps control her lower extremity edema. She is on Coumadin for anticoagulation with no recent melena, hematochezia or hematuria.  Past Medical History:  Diagnosis Date   Allergic rhinitis    Esophageal reflux    Essential hypertension    [place]    GERD (gastroesophageal reflux disease)    History of cardiac catheterization    Normal coronaries July 2015   History of DVT (deep vein thrombosis)    History of pulmonary embolism     Multiple, status post IVC filter, on Coumadin   Mixed hyperlipidemia    Obstructive sleep apnea    NPSG 05-29-07; AHI 32.9,cpap 7 to 9   On home oxygen therapy    2 liters at night- usual- no recent changes   Osteoarthritis    Persistent atrial fibrillation ([facility]    Documented October 2016   Transfusion history    [facility]    Past Surgical History:  Procedure Laterality Date   CHOLECYSTECTOMY     COLONOSCOPY WITH PROPOFOL N/A 06/13/2015   Procedure: COLONOSCOPY WITH PROPOFOL;  Surgeon: MGarlan Fair MD;  Location: [facility] END[facility]COPY;  Service: Endoscopy;  Laterality: N/A;   ENDOMETRIAL ABLATION     GANGLION CYST EXCISION     Hernia abscess     HERNIA REPAIR     umbilical   I&D hernia incisional abscess     IR ABLATE LIVER CRYOABLATION  05/12/2019   IR RADIOLOGIST EVAL & MGMT  05/10/2019   IVC filter Right    LEFT HEART CATHETERIZATION WITH CORONARY ANGIOGRAM N/A 08/20/2013   Procedure: LEFT HEART CATHETERIZATION WITH CORONARY ANGIOGRAM;  Surgeon: MBlane Ohara MD;  [facility];  Service: Cardiovascular;  Laterality: N/A;   ROTATOR CUFF REPAIR      Current Medications: Outpatient Medications Prior to Visit  Medication Sig Dispense Refill   flecainide (TAMBOCOR) 50 MG tablet Take 1 tablet by mouth twice daily 60 tablet 3   furosemide (LASIX) 20 MG tablet Take  20 mg by mouth 2 (two) times daily.     hydrOXYzine (ATARAX/VISTARIL) 25 MG tablet Take 25 mg by mouth daily.     oxybutynin (DITROPAN) 5 MG tablet Take 5 mg by mouth 2 (two) times daily.     pravastatin (PRAVACHOL) 80 MG tablet Take 80 mg by mouth at bedtime.      traMADol (ULTRAM) 50 MG tablet Take 50 mg by mouth 3 (three) times daily as needed.     triamcinolone ointment (KENALOG) 0.5 % Apply topically 2 (two) times daily as needed.     warfarin (COUMADIN) 10 MG tablet Take 10 mg by mouth See admin instructions. Take one tablet daily except none of Friday.     No  facility-administered medications prior to visit.     Allergies:   Adhesive [tape] and Benazepril hcl   Social History   Socioeconomic History   Marital status: Widowed    Spouse name: Not on file   Number of children: Not on file   Years of education: Not on file   Highest education level: Not on file  Occupational History   Occupation: Disability  Tobacco Use   Smoking status: Never   Smokeless tobacco: Never   Tobacco comments:    only as a teen for 6 months  Vaping Use   Vaping Use: Never used  Substance and Sexual Activity   Alcohol use: No    Alcohol/week: 0.0 standard drinks of alcohol   Drug use: No   Sexual activity: Not on file  Other Topics Concern   Not on file  Social History Narrative   Widowed-cirrhosis   Social Determinants of Health   Financial Resource Strain: Not on file  Food Insecurity: Not on file  Transportation Needs: Not on file  Physical Activity: Not on file  Stress: Not on file  Social Connections: Not on file     Family History:  The patient's family history includes Allergies in an other family member; Asthma in an other family member; COPD in an other family member; Cancer in her mother; Heart disease in her father.   Review of Systems:    Please see the history of present illness.     All other systems reviewed and are otherwise negative except as noted above.   Physical Exam:    VS:  BP 132/78   Pulse 90   Ht 5' 6.5" (1.689 m)   Wt 269 lb 12.8 oz (122.4 kg)   SpO2 95%   BMI 42.89 kg/m    General: Pleasant female appearing in no acute distress. Head: Normocephalic, atraumatic. Neck: No carotid bruits. JVD not elevated.  Lungs: Respirations regular and unlabored, without wheezes or rales.  Heart: Regular rate and rhythm. No S3 or S4.  No murmur, no rubs, or gallops appreciated. Abdomen: Appears non-distended. No obvious abdominal masses. Msk:  Strength and tone appear normal for age. No obvious joint deformities or  effusions. Extremities: No clubbing or cyanosis. No pitting edema.  Distal pedal pulses are 2+ bilaterally. Neuro: Alert and oriented X 3. Moves all extremities spontaneously. No focal deficits noted. Psych:  Responds to questions appropriately with a normal affect. Skin: No rashes or lesions noted  Wt Readings from Last 3 Encounters:  12/05/21 269 lb 12.8 oz (122.4 kg)  05/07/21 276 lb (125.2 kg)  04/23/21 272 lb (123.4 kg)     Studies/Labs Reviewed:   EKG:  EKG is ordered today.  The ekg ordered today demonstrates NSR, HR 90 with IVCD  and slight TWI along inferior leads which has been noted on prior tracings.   Recent Labs: No results found for requested labs within last 365 days.   Lipid Panel No results found for: "CHOL", "TRIG", "HDL", "CHOLHDL", "VLDL", "LDLCALC", "LDLDIRECT"  Additional studies/ records that were reviewed today include:   Echocardiogram: 01/2020 IMPRESSIONS     1. Left ventricular ejection fraction, by estimation, is 60 to 65%. The  left ventricle has normal function. The left ventricle has no regional  wall motion abnormalities. Left ventricular diastolic parameters are  consistent with Grade I diastolic  dysfunction (impaired relaxation).   2. Right ventricular systolic function is normal. The right ventricular  size is normal.   3. Left atrial size was moderately dilated.   4. The mitral valve is normal in structure. No evidence of mitral valve  regurgitation. No evidence of mitral stenosis.   5. The aortic valve is tricuspid. Aortic valve regurgitation is not  visualized. No aortic stenosis is present.   6. The inferior vena cava is normal in size with greater than 50%  respiratory variability, suggesting right atrial pressure of 3 mmHg.   Assessment:    1. PAF (paroxysmal atrial fibrillation) ([place])   2. Homozygous Factor V Leiden mutation ([place])   3. Mixed hyperlipidemia      Plan:   In order of problems listed above:  1. Paroxysmal  Atrial Fibrillation - She denies any recent palpitations but does report her heart rate was registering in the 30's to 40's this weekend in the setting of active nausea and vomiting due to a migraine headache. Her heart rate has normalized in the interim and she is in normal sinus rhythm today with heart rate in the 90's. I encouraged her to continue to follow vitals at home and report back if she has recurrent bradycardia as we can place a Zio patch for further assessment. She remains on Flecainide 50 mg twice daily for rhythm control. - Continue Coumadin for anticoagulation.  2. History of PE/DVT's/Factor V Leiden Mutation - She remains on Coumadin for anticoagulation and dosing is managed by her PCP. She denies any evidence of active bleeding. Will request most recent labs.  3. HLD - Followed by her PCP and we will request a copy of most recent labs. She remains on Pravastatin 80 mg daily.   Medication Adjustments/Labs and Tests Ordered: Current medicines are reviewed at length with the patient today.  Concerns regarding medicines are outlined above.  Medication changes, Labs and Tests ordered today are listed in the Patient Instructions below. Patient Instructions  Medication Instructions:  Your physician recommends that you continue on your current medications as directed. Please refer to the Current Medication list given to you today.   Labwork: None  Testing/Procedures: None  Follow-Up: Follow up with Dr. Domenic Polite in 6 month.   Any Other Special Instructions Will Be Listed Below (If Applicable).     If you need a refill on your cardiac medications before your next appointment, please call your pharmacy.    Signed, Erma Heritage, PA-C  12/05/2021 5:06 PM    [facility] [address] [place], [place] [address] Phone: [phone] Fax: [phone]

## 2021-12-17 DIAGNOSIS — Z7901 Long term (current) use of anticoagulants: Secondary | ICD-10-CM | POA: Diagnosis not present

## 2021-12-31 DIAGNOSIS — Z7901 Long term (current) use of anticoagulants: Secondary | ICD-10-CM | POA: Diagnosis not present

## 2022-01-10 ENCOUNTER — Ambulatory Visit: Payer: PPO | Admitting: Podiatry

## 2022-01-10 ENCOUNTER — Encounter: Payer: Self-pay | Admitting: Podiatry

## 2022-01-10 DIAGNOSIS — B351 Tinea unguium: Secondary | ICD-10-CM

## 2022-01-10 DIAGNOSIS — M79676 Pain in unspecified toe(s): Secondary | ICD-10-CM | POA: Diagnosis not present

## 2022-01-10 DIAGNOSIS — D2372 Other benign neoplasm of skin of left lower limb, including hip: Secondary | ICD-10-CM | POA: Diagnosis not present

## 2022-01-10 DIAGNOSIS — D2371 Other benign neoplasm of skin of right lower limb, including hip: Secondary | ICD-10-CM

## 2022-01-10 DIAGNOSIS — D689 Coagulation defect, unspecified: Secondary | ICD-10-CM

## 2022-01-10 NOTE — Progress Notes (Signed)
He presents today chief complaint of painful elongated toenails and multiple calluses plantar aspect of the bilateral foot.  Objective: Pulses are palpable.  Vital signs are stable she is alert and oriented x 3.  She has multiple benign skin lesions plantar aspect of the bilateral foot toenails are long thick yellow dystrophic onychomycotic no open lesions or wounds.  Assessment: Pain limb secondary to onychomycosis and benign skin lesions.  Plan: Debridement of benign skin lesions and debridement of nails 1 through 5 bilateral.

## 2022-02-01 DIAGNOSIS — Z7901 Long term (current) use of anticoagulants: Secondary | ICD-10-CM | POA: Diagnosis not present

## 2022-02-04 ENCOUNTER — Encounter: Payer: Self-pay | Admitting: Podiatry

## 2022-02-05 NOTE — Telephone Encounter (Signed)
Please contact.

## 2022-02-15 DIAGNOSIS — Z1231 Encounter for screening mammogram for malignant neoplasm of breast: Secondary | ICD-10-CM | POA: Diagnosis not present

## 2022-02-20 ENCOUNTER — Encounter: Payer: Self-pay | Admitting: Orthopaedic Surgery

## 2022-02-20 ENCOUNTER — Ambulatory Visit (INDEPENDENT_AMBULATORY_CARE_PROVIDER_SITE_OTHER): Payer: PPO | Admitting: Orthopaedic Surgery

## 2022-02-20 DIAGNOSIS — M25561 Pain in right knee: Secondary | ICD-10-CM | POA: Diagnosis not present

## 2022-02-20 DIAGNOSIS — M25562 Pain in left knee: Secondary | ICD-10-CM | POA: Diagnosis not present

## 2022-02-20 DIAGNOSIS — G8929 Other chronic pain: Secondary | ICD-10-CM

## 2022-02-20 DIAGNOSIS — M1711 Unilateral primary osteoarthritis, right knee: Secondary | ICD-10-CM | POA: Diagnosis not present

## 2022-02-20 DIAGNOSIS — M1712 Unilateral primary osteoarthritis, left knee: Secondary | ICD-10-CM

## 2022-02-20 MED ORDER — METHYLPREDNISOLONE ACETATE 40 MG/ML IJ SUSP
40.0000 mg | INTRAMUSCULAR | Status: AC | PRN
  Administered 2022-02-20: 40 mg via INTRA_ARTICULAR

## 2022-02-20 MED ORDER — LIDOCAINE HCL 1 % IJ SOLN
3.0000 mL | INTRAMUSCULAR | Status: AC | PRN
  Administered 2022-02-20: 3 mL

## 2022-02-20 NOTE — Progress Notes (Signed)
The patient is well-known to [place].  She comes in about every 3 months for steroid injections in both knees to treat the pain from severe osteoarthritis.  The last time I checked her BMI was 43.  Has been a while but she says she has not lost any weight at all.  She is not a diabetic.  She does ambulate with a walker.  She has had no acute change in her medical status otherwise.  It has been just over 3 months since her last steroid injection.  On exam both knees have just slight malalignment but no effusion today.  Both knees have global tenderness and patellofemoral crepitation.  There is not a very large soft tissue envelope at all around either knee.  I did place a steroid injection in both knees which she tolerated well.  We can certainly see her back in 3 months for repeat injections but no x-rays are needed.  We should at least get a new weight and BMI calculation at her next visit.       Procedure Note  Patient: Rhonda Acevedo             Date of Birth: 1950/11/02           MRN: [id number]             Visit Date: 02/20/2022  Procedures: Visit Diagnoses:  1. Unilateral primary osteoarthritis, left knee   2. Unilateral primary osteoarthritis, right knee   3. Chronic pain of left knee   4. Chronic pain of right knee   5. Severe obesity (BMI >= 40) (HCC)     Large Joint Inj: R knee on 02/20/2022 1:48 PM Indications: diagnostic evaluation and pain Details: 22 G 1.5 in needle, superolateral approach  Arthrogram: No  Medications: 3 mL lidocaine 1 %; 40 mg methylPREDNISolone acetate 40 MG/ML Outcome: tolerated well, no immediate complications Procedure, treatment alternatives, risks and benefits explained, specific risks discussed. Consent was given by the patient. Immediately prior to procedure a time out was called to verify the correct patient, procedure, equipment, support staff and site/side marked as required. Patient was prepped and draped in the usual sterile fashion.     Large Joint Inj: L knee on 02/20/2022 1:48 PM Indications: diagnostic evaluation and pain Details: 22 G 1.5 in needle, superolateral approach  Arthrogram: No  Medications: 3 mL lidocaine 1 %; 40 mg methylPREDNISolone acetate 40 MG/ML Outcome: tolerated well, no immediate complications Procedure, treatment alternatives, risks and benefits explained, specific risks discussed. Consent was given by the patient. Immediately prior to procedure a time out was called to verify the correct patient, procedure, equipment, support staff and site/side marked as required. Patient was prepped and draped in the usual sterile fashion.

## 2022-03-13 ENCOUNTER — Other Ambulatory Visit: Payer: Self-pay | Admitting: Cardiology

## 2022-03-19 ENCOUNTER — Telehealth: Payer: Self-pay | Admitting: Cardiology

## 2022-03-19 ENCOUNTER — Encounter: Payer: Self-pay | Admitting: Internal Medicine

## 2022-03-19 NOTE — Telephone Encounter (Signed)
Rhonda Acevedo from pt's PCP office called to inform Dr. Domenic Polite pt's hr has been in the lower 40's for past two weeks and the Dr. [name] was interested in zio monitor  Bp 93/47 137/67

## 2022-03-20 NOTE — Telephone Encounter (Signed)
Spoke to patient who is agreeable to coming into the [place] office on 2/29 for NV for EKG. Patient had no questions or concerns at this time.

## 2022-03-21 ENCOUNTER — Ambulatory Visit: Payer: PPO | Attending: Cardiology

## 2022-03-21 DIAGNOSIS — J4540 Moderate persistent asthma, uncomplicated: Secondary | ICD-10-CM | POA: Diagnosis not present

## 2022-03-21 DIAGNOSIS — G8929 Other chronic pain: Secondary | ICD-10-CM | POA: Diagnosis not present

## 2022-03-21 DIAGNOSIS — M1990 Unspecified osteoarthritis, unspecified site: Secondary | ICD-10-CM | POA: Diagnosis not present

## 2022-03-21 DIAGNOSIS — I509 Heart failure, unspecified: Secondary | ICD-10-CM | POA: Diagnosis not present

## 2022-03-21 DIAGNOSIS — I480 Paroxysmal atrial fibrillation: Secondary | ICD-10-CM

## 2022-03-21 DIAGNOSIS — E7800 Pure hypercholesterolemia, unspecified: Secondary | ICD-10-CM | POA: Diagnosis not present

## 2022-03-21 DIAGNOSIS — M179 Osteoarthritis of knee, unspecified: Secondary | ICD-10-CM | POA: Diagnosis not present

## 2022-03-21 DIAGNOSIS — I10 Essential (primary) hypertension: Secondary | ICD-10-CM | POA: Diagnosis not present

## 2022-03-21 DIAGNOSIS — I4891 Unspecified atrial fibrillation: Secondary | ICD-10-CM | POA: Diagnosis not present

## 2022-03-21 DIAGNOSIS — K219 Gastro-esophageal reflux disease without esophagitis: Secondary | ICD-10-CM | POA: Diagnosis not present

## 2022-03-21 NOTE — Progress Notes (Signed)
Patient presents today for nurse visit- EKG per provider. Pt stated that she has been feeling fine. Pt stated that hr has been in the 40's for the last few months, this especially happens when she is relaxed.   Pt denies new sob, cp.   Pt's hr per EKG is 50 bpm.  Pt's Cardizem was d/c'd by provider on 10/25/2020- per mychart message.   Will have EKG scanned to provider for review.

## 2022-03-22 ENCOUNTER — Telehealth: Payer: Self-pay

## 2022-03-22 ENCOUNTER — Other Ambulatory Visit: Payer: Self-pay

## 2022-03-22 ENCOUNTER — Ambulatory Visit: Payer: PPO | Attending: Cardiology

## 2022-03-22 DIAGNOSIS — I480 Paroxysmal atrial fibrillation: Secondary | ICD-10-CM

## 2022-03-22 NOTE — Telephone Encounter (Signed)
-----   Message from Satira Sark, MD sent at 03/21/2022  4:10 PM EST ----- ECG reviewed showing sinus arrhythmia at 50 bpm.  Nursing visit note reviewed.  If she is currently not on any Cardizem CD, then I would agree with placing a 72-hour ZIO to better investigate heart rate variability and importantly exclude any evidence of heart block or pauses. ----- Message ----- From: Berlinda Last, CMA Sent: 03/21/2022   4:00 PM EST To: Satira Sark, MD

## 2022-03-22 NOTE — Telephone Encounter (Signed)
Patient notified and opted to have monitor shipped to her home so that she does not have to come back out. Patient notified that [facility]) would give her a call prior to shipping monitor to verify address.

## 2022-03-26 DIAGNOSIS — I480 Paroxysmal atrial fibrillation: Secondary | ICD-10-CM | POA: Diagnosis not present

## 2022-04-01 DIAGNOSIS — Z7901 Long term (current) use of anticoagulants: Secondary | ICD-10-CM | POA: Diagnosis not present

## 2022-04-08 ENCOUNTER — Other Ambulatory Visit: Payer: Self-pay | Admitting: Cardiology

## 2022-04-08 DIAGNOSIS — H52203 Unspecified astigmatism, bilateral: Secondary | ICD-10-CM | POA: Diagnosis not present

## 2022-04-08 DIAGNOSIS — H35372 Puckering of macula, left eye: Secondary | ICD-10-CM | POA: Diagnosis not present

## 2022-04-08 DIAGNOSIS — Z961 Presence of intraocular lens: Secondary | ICD-10-CM | POA: Diagnosis not present

## 2022-04-11 ENCOUNTER — Encounter: Payer: Self-pay | Admitting: Podiatry

## 2022-04-11 ENCOUNTER — Ambulatory Visit: Payer: PPO | Admitting: Podiatry

## 2022-04-11 DIAGNOSIS — D2371 Other benign neoplasm of skin of right lower limb, including hip: Secondary | ICD-10-CM

## 2022-04-11 DIAGNOSIS — D689 Coagulation defect, unspecified: Secondary | ICD-10-CM

## 2022-04-11 DIAGNOSIS — B351 Tinea unguium: Secondary | ICD-10-CM

## 2022-04-11 DIAGNOSIS — D2372 Other benign neoplasm of skin of left lower limb, including hip: Secondary | ICD-10-CM | POA: Diagnosis not present

## 2022-04-11 DIAGNOSIS — M79676 Pain in unspecified toe(s): Secondary | ICD-10-CM

## 2022-04-12 ENCOUNTER — Encounter: Payer: Self-pay | Admitting: Cardiology

## 2022-04-15 NOTE — Progress Notes (Signed)
Presents today chief complaint of painful elongated toenails and painful calluses.  Objective: Toenails are long thick yellow dystrophic and mycotic benign skin lesions bilateral no open lesions or wounds.  Assessment: Plantar forefoot benign skin lesions pain limb secondary to plantar fasciitis.  Plan: Debridement of toenails and debridement of benign skin lesions bilateral.

## 2022-04-18 DIAGNOSIS — I480 Paroxysmal atrial fibrillation: Secondary | ICD-10-CM | POA: Diagnosis not present

## 2022-04-21 ENCOUNTER — Encounter: Payer: Self-pay | Admitting: Cardiology

## 2022-04-30 ENCOUNTER — Telehealth: Payer: Self-pay

## 2022-04-30 NOTE — Telephone Encounter (Signed)
ATC X1 LVM for patient to call the office back. Patient will need to be re-scheduled. Dr. Maple Hudson will be out of the office at 51

## 2022-05-03 ENCOUNTER — Ambulatory Visit: Payer: PPO | Admitting: Internal Medicine

## 2022-05-16 DIAGNOSIS — R9389 Abnormal findings on diagnostic imaging of other specified body structures: Secondary | ICD-10-CM | POA: Diagnosis not present

## 2022-05-16 DIAGNOSIS — Z9889 Other specified postprocedural states: Secondary | ICD-10-CM | POA: Diagnosis not present

## 2022-05-22 ENCOUNTER — Ambulatory Visit (INDEPENDENT_AMBULATORY_CARE_PROVIDER_SITE_OTHER): Payer: PPO | Admitting: Orthopaedic Surgery

## 2022-05-22 ENCOUNTER — Encounter: Payer: Self-pay | Admitting: Orthopaedic Surgery

## 2022-05-22 DIAGNOSIS — M25562 Pain in left knee: Secondary | ICD-10-CM

## 2022-05-22 DIAGNOSIS — G8929 Other chronic pain: Secondary | ICD-10-CM

## 2022-05-22 DIAGNOSIS — M25561 Pain in right knee: Secondary | ICD-10-CM

## 2022-05-22 MED ORDER — METHYLPREDNISOLONE ACETATE 40 MG/ML IJ SUSP
40.0000 mg | INTRAMUSCULAR | Status: AC | PRN
  Administered 2022-05-22: 40 mg via INTRA_ARTICULAR

## 2022-05-22 MED ORDER — LIDOCAINE HCL 1 % IJ SOLN
3.0000 mL | INTRAMUSCULAR | Status: AC | PRN
  Administered 2022-05-22: 3 mL

## 2022-05-22 NOTE — Progress Notes (Signed)
The patient is well-known to [place].  She has debilitating arthritis that is end-stage in both her knees.  Another debilitating factor for her is her weight.  She is still been unable to lose weight and her BMI is over 40 and 44.04.  She comes in about every 3 months for steroid injection in her knees.  She is not a diabetic.  She has been unable to lose weight.  On exam she does have a large soft tissue envelope around both knees.  She has venous stasis changes in her legs.  It would certainly be difficult surgery for her undergoing knee replacement surgery in terms of the difficulty of this type of surgery clinically and mechanically due to her weight.  Her insurance is another limiting factor in terms of what they allow for joint replacement surgery.  She does state the steroid injections have helped her some.  She comes in today requesting bilateral knee steroid injections and has been just over 3 months since her last steroid injections.  I did place steroids in her knees today without difficulty.  We can see her back in 3 months to do this again.     Procedure Note  Patient: Rhonda Acevedo             Date of Birth: [birth year]           MRN: [id number]             Visit Date: 05/22/2022  Procedures: Visit Diagnoses:  1. Severe obesity (BMI >= 40) (HCC)   2. Chronic pain of left knee   3. Chronic pain of right knee     Large Joint Inj: R knee on 05/22/2022 1:29 PM Indications: diagnostic evaluation and pain Details: 22 G 1.5 in needle, superolateral approach  Arthrogram: No  Medications: 3 mL lidocaine 1 %; 40 mg methylPREDNISolone acetate 40 MG/ML Outcome: tolerated well, no immediate complications Procedure, treatment alternatives, risks and benefits explained, specific risks discussed. Consent was given by the patient. Immediately prior to procedure a time out was called to verify the correct patient, procedure, equipment, support staff and site/side marked as required. Patient was  prepped and draped in the usual sterile fashion.    Large Joint Inj: L knee on 05/22/2022 1:29 PM Indications: diagnostic evaluation and pain Details: 22 G 1.5 in needle, superolateral approach  Arthrogram: No  Medications: 3 mL lidocaine 1 %; 40 mg methylPREDNISolone acetate 40 MG/ML Outcome: tolerated well, no immediate complications Procedure, treatment alternatives, risks and benefits explained, specific risks discussed. Consent was given by the patient. Immediately prior to procedure a time out was called to verify the correct patient, procedure, equipment, support staff and site/side marked as required. Patient was prepped and draped in the usual sterile fashion.

## 2022-05-27 DIAGNOSIS — Z7901 Long term (current) use of anticoagulants: Secondary | ICD-10-CM | POA: Diagnosis not present

## 2022-05-27 DIAGNOSIS — Z86711 Personal history of pulmonary embolism: Secondary | ICD-10-CM | POA: Diagnosis not present

## 2022-05-27 DIAGNOSIS — I5032 Chronic diastolic (congestive) heart failure: Secondary | ICD-10-CM | POA: Diagnosis not present

## 2022-05-27 DIAGNOSIS — I4891 Unspecified atrial fibrillation: Secondary | ICD-10-CM | POA: Diagnosis not present

## 2022-05-27 DIAGNOSIS — R7303 Prediabetes: Secondary | ICD-10-CM | POA: Diagnosis not present

## 2022-05-27 DIAGNOSIS — D6869 Other thrombophilia: Secondary | ICD-10-CM | POA: Diagnosis not present

## 2022-05-27 DIAGNOSIS — I10 Essential (primary) hypertension: Secondary | ICD-10-CM | POA: Diagnosis not present

## 2022-05-27 DIAGNOSIS — Z9181 History of falling: Secondary | ICD-10-CM | POA: Diagnosis not present

## 2022-05-27 DIAGNOSIS — Z6841 Body Mass Index (BMI) 40.0 and over, adult: Secondary | ICD-10-CM | POA: Diagnosis not present

## 2022-05-27 DIAGNOSIS — J4540 Moderate persistent asthma, uncomplicated: Secondary | ICD-10-CM | POA: Diagnosis not present

## 2022-05-27 DIAGNOSIS — G4733 Obstructive sleep apnea (adult) (pediatric): Secondary | ICD-10-CM | POA: Diagnosis not present

## 2022-05-27 DIAGNOSIS — Z1331 Encounter for screening for depression: Secondary | ICD-10-CM | POA: Diagnosis not present

## 2022-05-27 DIAGNOSIS — E7800 Pure hypercholesterolemia, unspecified: Secondary | ICD-10-CM | POA: Diagnosis not present

## 2022-05-27 DIAGNOSIS — Z0000 Encounter for general adult medical examination without abnormal findings: Secondary | ICD-10-CM | POA: Diagnosis not present

## 2022-06-04 NOTE — Progress Notes (Unsigned)
HPI F followed for OSA, OHS, morbid obesity, chronic hypoxic respiratory failure, complicated by hx DVT/ PE/warfarin, asthma/ bronchitis, GERD PFT 04/12/2011-normal spirometry flows within significant response to bronchodilator. FEV1/FVC 0.79. Mild restriction and moderate diffusion reduction both may be do to her obesity. PFT: 02/10/2014-obstructive and restrictive changes and reduction of diffusion best explained by her obesity/hypoventilation syndrome ----------------------------------------------------------------------   34/3/23- 72 year old female never smoker followed for OSA, OHS, chronic hypoxic respiratory failure, morbid obesity, complicated by history DVT/PE/warfarin, asthma/bronchitis, GERD, P A. Fib CPAP auto 4-12 / [employer] using O2 for sleep 5 months ago Download-compliance  100%, AHI 0.3/ hr Body weight today-272 lbs    Has lost 30 more lbs. Covid vax-none Flu vax-none Working with her daughter to lose weight. No acute events. Weight loss has slowed lately.   06/06/22-72 year old female never smoker followed for OSA, OHS, chronic hypoxic respiratory failure, morbid obesity, complicated by history DVT/PE/warfarin, asthma/bronchitis, GERD, P A. Fib CPAP auto 4-12 / [employer] using O2 for sleep  Download-compliance  97%, AHI 0.3/ hr Body weight today-269 lbs -----Doing well.  Does have recent problem with bradycardia x several months.  Seeing cardiologist. Fernande Boyden reviewed.  She is very comfortable with her CPAP.  Machine is now older than 5 years so we will order replacement but continue auto 4-12. Her breathing is otherwise quite good with no cough, wheeze or concerns.  Note that she is followed now by cardiology for bradycardia but has not been told of any plans for pacemaker at this time.  ROS-see HPI  + = positive Constitutional:   +dieting weight loss, night sweats, fevers, chills, fatigue, lassitude. HEENT:   No-  headaches, difficulty  swallowing, tooth/dental problems, sore throat,       No-  sneezing, itching, ear ache, nasal congestion, post nasal drip,  CV:  No-   chest pain, orthopnea, PND, swelling in lower extremities, anasarca,  dizziness, palpitations Resp: + shortness of breath with exertion or at rest.                productive cough,  non-productive cough,  No- coughing up of blood.               change in color of mucus.  No- wheezing.   Skin: No-   rash or lesions. GI:  No-   heartburn, indigestion, abdominal pain, nausea, vomiting,  GU:  MS:  No-   joint pain or swelling.   Neuro-     nothing unusual Psych:  No- change in mood or affect. No depression or anxiety.  No memory loss.  OBJ- Physical Exam General- Alert, Oriented, Affect-appropriate, Distress- none acute. + obese Skin- rash-none, lesions- none, excoriation- none Lymphadenopathy- none Head- atraumatic            Eyes- Gross vision intact, PERRLA, conjunctivae and secretions clear            Ears- Hearing, canals-normal            Nose- Clear, no-Septal dev, mucus, polyps, erosion, perforation             Throat- Mallampati II-III , mucosa clear , drainage- none, tonsils- atrophic. No-hoarseness, throat clearing Neck- flexible , trachea midline, no stridor , thyroid nl, carotid no bruit Chest - symmetrical excursion , unlabored           Heart/CV- RRR , no murmur , no gallop  , no rub, nl s1 s2                           -  JVD- none , edema-+1, stasis changes+chronic, varices- none           Lung-  wheeze- none, cough- none, dullness-none, rub- none. Shallow c/w habitus           Chest wall-  Abd- Br/ Gen/ Rectal- Not done, not indicated Extrem- +rolling walker Neuro- grossly intact to observation

## 2022-06-06 ENCOUNTER — Ambulatory Visit: Payer: PPO | Admitting: Internal Medicine

## 2022-06-06 ENCOUNTER — Encounter: Payer: Self-pay | Admitting: Internal Medicine

## 2022-06-06 VITALS — BP 124/78 | HR 49 | Temp 98.0°F | Ht 66.5 in | Wt 269.2 lb

## 2022-06-06 DIAGNOSIS — G4733 Obstructive sleep apnea (adult) (pediatric): Secondary | ICD-10-CM

## 2022-06-06 DIAGNOSIS — J9611 Chronic respiratory failure with hypoxia: Secondary | ICD-10-CM | POA: Diagnosis not present

## 2022-06-06 NOTE — Patient Instructions (Signed)
Order- DME [facility]- please replace old CPAP machine, auto 4-12, mask of choice, humidifier, supplies, AirView/ SD card  Please call if we can help

## 2022-06-06 NOTE — Assessment & Plan Note (Signed)
She continues to benefit from CPAP and has been fully compliant with good control. Plan-replace old CPAP machine, continue auto 4-12

## 2022-06-06 NOTE — Assessment & Plan Note (Signed)
She took herself off of oxygen in 2022 and feels comfortable without it.

## 2022-06-06 NOTE — Assessment & Plan Note (Signed)
Her weight has stabilized now.  She remains obese but much improved from years ago.

## 2022-06-10 DIAGNOSIS — Z7901 Long term (current) use of anticoagulants: Secondary | ICD-10-CM | POA: Diagnosis not present

## 2022-06-24 DIAGNOSIS — Z7901 Long term (current) use of anticoagulants: Secondary | ICD-10-CM | POA: Diagnosis not present

## 2022-06-26 DIAGNOSIS — Z7901 Long term (current) use of anticoagulants: Secondary | ICD-10-CM | POA: Diagnosis not present

## 2022-06-28 ENCOUNTER — Ambulatory Visit: Payer: PPO | Attending: Cardiology | Admitting: Cardiology

## 2022-06-28 ENCOUNTER — Encounter: Payer: Self-pay | Admitting: Cardiology

## 2022-06-28 VITALS — BP 149/72 | HR 51 | Ht 66.5 in | Wt 273.6 lb

## 2022-06-28 DIAGNOSIS — I480 Paroxysmal atrial fibrillation: Secondary | ICD-10-CM

## 2022-06-28 DIAGNOSIS — D6851 Activated protein C resistance: Secondary | ICD-10-CM | POA: Diagnosis not present

## 2022-06-28 DIAGNOSIS — E782 Mixed hyperlipidemia: Secondary | ICD-10-CM

## 2022-06-28 DIAGNOSIS — I495 Sick sinus syndrome: Secondary | ICD-10-CM

## 2022-06-28 NOTE — Progress Notes (Signed)
    Cardiology Office Note  Date: 06/28/2022   ID: ADDALIE CALLES, DOB [birth year], MRN [id number]  History of Present Illness: Rhonda Acevedo is a 72 y.o. female last seen in November 2023 by Ms. Strader PA-C, I reviewed the note.  She is here for a follow-up visit.  States that she has had occasional feelings of lightheadedness, no syncope.  Has felt some intermittent palpitations although none prolonged.  We discussed her cardiac monitor from April as described below.  She does have bradycardia which is potentially symptomatic, although no pauses or high degree heart block were noted.  She is not on any AV nodal blockers.  Continues on flecainide for control of PAF otherwise.  She also remains on Coumadin per PCP.  ECG today shows sinus bradycardia at 49 beats per minutes with low voltage.  Physical Exam: VS:  BP (!) 149/72   Pulse (!) 51   Ht 5' 6.5" (1.689 m)   Wt 273 lb 9.6 oz (124.1 kg)   SpO2 97%   BMI 43.50 kg/m , BMI Body mass index is 43.5 kg/m.  Wt Readings from Last 3 Encounters:  06/28/22 273 lb 9.6 oz (124.1 kg)  06/06/22 269 lb 3.2 oz (122.1 kg)  05/22/22 277 lb (125.6 kg)    General: Patient appears comfortable at rest. HEENT: Conjunctiva and lids normal. Neck: Supple, no elevated JVP or carotid bruits. Lungs: Clear to auscultation, nonlabored breathing at rest. Cardiac: Regular rate and rhythm, no S3 or significant systolic murmur. Extremities: No pitting edema.  ECG:  An ECG dated 03/21/2022 was personally reviewed today and demonstrated:  Sinus arrhythmia with nonspecific T wave changes.  Labwork:  February 2024: Hemoglobin 13.4, platelets 205, BUN 15, creatinine 0.79, potassium 4.2, AST 14, ALT 13, hemoglobin A1c 5.5%, cholesterol 186, triglycerides 128, HDL 70, LDL 94, TSH 1.61  Other Studies Reviewed Today:  Cardiac monitor April 2024: ZIO XT reviewed.  2 days, 20 hours analyzed.   Predominant rhythm is sinus with heart rate ranging from 34 bpm up  to 122 bpm and average heart rate 50 bpm. There were rare PACs including atrial couplets and triplets representing less than 1% total beats. There were rare PVCs including ventricular triplets representing less than 1% total beats. Brief burst of SVT noted lasting only 4 beats. There were no pauses or evidence of high degree heart block.  Assessment and Plan:  1.  Paroxysmal atrial fibrillation with CHA2DS2-VASc score of 3.  Continues on flecainide and Coumadin.  She is not on any AV nodal blockers at this time.  2.  Sinus bradycardia, recent cardiac monitor from April noted above.  No evidence of pauses or high degree heart block.  Do question symptomatic status however, although not entirely clear that she has an absolute need for pacing.  I will request EP consultation with Dr. Ladona Ridgel for further review.  I do not think that she could navigate a treadmill to assess for chronotropic incompetence.  She is not on any AV nodal blockers.  3.  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  She continues on lifelong anticoagulation.  Disposition:  Follow up  3 months.  Signed, Jonelle Sidle, M.D., F.A.C.C. [facility]

## 2022-06-28 NOTE — Patient Instructions (Signed)
Medication Instructions:  Your physician recommends that you continue on your current medications as directed. Please refer to the Current Medication list given to you today.   Labwork: None today  Testing/Procedures: None today  Follow-Up: 3 months Dr.McDowell  Referral to Dr.Taylor, Electrophysiologist   Any Other Special Instructions Will Be Listed Below (If Applicable).  If you need a refill on your cardiac medications before your next appointment, please call your pharmacy.

## 2022-07-04 ENCOUNTER — Other Ambulatory Visit
Admission: RE | Admit: 2022-07-04 | Discharge: 2022-07-04 | Disposition: A | Discharge status: Home / Self Care | Payer: PPO | Source: Ambulatory Visit | Attending: Internal Medicine | Admitting: Internal Medicine

## 2022-07-04 ENCOUNTER — Encounter: Payer: Self-pay | Admitting: Internal Medicine

## 2022-07-04 ENCOUNTER — Ambulatory Visit (INDEPENDENT_AMBULATORY_CARE_PROVIDER_SITE_OTHER): Payer: PPO | Admitting: Internal Medicine

## 2022-07-04 VITALS — BP 162/80 | HR 54 | Ht 66.5 in | Wt 274.0 lb

## 2022-07-04 DIAGNOSIS — I495 Sick sinus syndrome: Secondary | ICD-10-CM | POA: Insufficient documentation

## 2022-07-04 LAB — CBC
HCT: 39.1 % (ref 36.0–46.0)
Hemoglobin: 12.5 g/dL (ref 12.0–15.0)
MCH: 30 pg (ref 26.0–34.0)
MCHC: 32 g/dL (ref 30.0–36.0)
MCV: 93.8 fL (ref 80.0–100.0)
Platelets: 198 10*3/uL (ref 150–400)
RBC: 4.17 MIL/uL (ref 3.87–5.11)
RDW: 13.3 % (ref 11.5–15.5)
WBC: 4 10*3/uL (ref 4.0–10.5)
nRBC: 0 % (ref 0.0–0.2)

## 2022-07-04 LAB — BASIC METABOLIC PANEL
Anion gap: 10 (ref 5–15)
BUN: 14 mg/dL (ref 8–23)
CO2: 25 mmol/L (ref 22–32)
Calcium: 8.8 mg/dL — ABNORMAL LOW (ref 8.9–10.3)
Chloride: 102 mmol/L (ref 98–111)
Creatinine, Ser: 0.68 mg/dL (ref 0.44–1.00)
GFR, Estimated: >60 mL/min (ref >60)
Glucose, Bld: 98 mg/dL (ref 70–99)
Potassium: 3.8 mmol/L (ref 3.5–5.1)
Sodium: 137 mmol/L (ref 135–145)

## 2022-07-04 NOTE — Progress Notes (Signed)
HPI Rhonda Acevedo is referred by Dr. Diona Browner for evaluation of PAF. She is a pleasant 72 yo woman who has  been treated with flecainide. She has developed worsening sinus node dysfunction and her HR log shows that the ave HR is around 45/min when she checks her bp and she has fatigue. She has not had syncope. She wore a zio monitor which showed HR's in the 34 with an ave of 50/min.  Allergies  Allergen Reactions   Adhesive [Tape] Itching and Rash   Benazepril Hcl Cough     Current Outpatient Medications  Medication Sig Dispense Refill   flecainide (TAMBOCOR) 50 MG tablet Take 1 tablet by mouth twice daily 60 tablet 5   furosemide (LASIX) 20 MG tablet Take 20 mg by mouth 2 (two) times daily.     hydrOXYzine (ATARAX/VISTARIL) 25 MG tablet Take 25 mg by mouth daily.     oxybutynin (DITROPAN) 5 MG tablet Take 5 mg by mouth 2 (two) times daily.     pravastatin (PRAVACHOL) 80 MG tablet Take 80 mg by mouth at bedtime.      traMADol (ULTRAM) 50 MG tablet Take 50 mg by mouth 3 (three) times daily as needed.     triamcinolone ointment (KENALOG) 0.5 % Apply topically 2 (two) times daily as needed.     warfarin (COUMADIN) 10 MG tablet Take 10 mg by mouth See admin instructions. Take one tablet daily except none of Friday.     No current facility-administered medications for this visit.     Past Medical History:  Diagnosis Date   Allergic rhinitis    Esophageal reflux    Essential hypertension    Factor V Leiden mutation (HCC)    GERD (gastroesophageal reflux disease)    History of cardiac catheterization    Normal coronaries July 2015   History of DVT (deep vein thrombosis)    History of pulmonary embolism    Multiple, status post IVC filter, on Coumadin   Mixed hyperlipidemia    Obstructive sleep apnea    NPSG 05-29-07; AHI 32.9,cpap 7 to 9   On home oxygen therapy    2 liters at night- usual- no recent changes   Osteoarthritis    Persistent atrial fibrillation [facility])     Documented October 2016   Transfusion history    Last transfusion after umbilical hernia- [facility]    ROS:   All systems reviewed and negative except as noted in the HPI.   Past Surgical History:  Procedure Laterality Date   CHOLECYSTECTOMY     COLONOSCOPY WITH PROPOFOL N/A 06/13/2015   Procedure: COLONOSCOPY WITH PROPOFOL;  Surgeon: Charolett Bumpers, MD;  Location: [facility] ENDOSCOPY;  Service: Endoscopy;  Laterality: N/A;   ENDOMETRIAL ABLATION     GANGLION CYST EXCISION     Hernia abscess     HERNIA REPAIR     umbilical   I&D hernia incisional abscess     IR ABLATE LIVER CRYOABLATION  05/12/2019   IR RADIOLOGIST EVAL & MGMT  05/10/2019   IVC filter Right    LEFT HEART CATHETERIZATION WITH CORONARY ANGIOGRAM N/A 08/20/2013   Procedure: LEFT HEART CATHETERIZATION WITH CORONARY ANGIOGRAM;  Surgeon: Micheline Chapman, MD;  Location: [facility] CATH LAB;  Service: Cardiovascular;  Laterality: N/A;   ROTATOR CUFF REPAIR       Family History  Problem Relation Age of Onset   Cancer Mother        Thyroid cancer   Heart  disease Father        Heart attack   Allergies Other    Asthma Other    COPD Other      Social History   Socioeconomic History   Marital status: Widowed    Spouse name: Not on file   Number of children: Not on file   Years of education: Not on file   Highest education level: Not on file  Occupational History   Occupation: Disability  Tobacco Use   Smoking status: Never   Smokeless tobacco: Never   Tobacco comments:    only as a teen for 6 months  Vaping Use   Vaping Use: Never used  Substance and Sexual Activity   Alcohol use: No    Alcohol/week: 0.0 standard drinks of alcohol   Drug use: No   Sexual activity: Not on file  Other Topics Concern   Not on file  Social History Narrative   Widowed-cirrhosis   Social Determinants of Health   Financial Resource Strain: Not on file  Food Insecurity: Not on file  Transportation Needs: Not on file   Physical Activity: Not on file  Stress: Not on file  Social Connections: Not on file  Intimate Partner Violence: Not on file     There were no vitals taken for this visit.  Physical Exam:  Morbidly obese appearing NAD HEENT: Unremarkable Neck:  No JVD, no thyromegally Lymphatics:  No adenopathy Back:  No CVA tenderness Lungs:  Clear with no wheezes HEART:  Regular rate rhythm, no murmurs, no rubs, no clicks Abd:  soft, positive bowel sounds, no organomegally, no rebound, no guarding Ext:  2 plus pulses, no edema, no cyanosis, no clubbing Skin:  No rashes no nodules Neuro:  CN II through XII intact, motor grossly intact  EKG -reviewed SB  Assess/Plan: PAF - she is maintaining NSR on flecainide. Continue Sinus node dysfunction -I have discussed the risks/benefits/goals and expectations of PPM insertion due to symptomatic sinus node dysfunction and she wishes to proceed.  Rhonda Acevedo [name],MD

## 2022-07-04 NOTE — H&P (View-Only) (Signed)
    HPI Ms. Squyres is referred by Dr. McDowell for evaluation of PAF. She is a pleasant 71 yo woman who has  been treated with flecainide. She has developed worsening sinus node dysfunction and her HR log shows that the ave HR is around 45/min when she checks her bp and she has fatigue. She has not had syncope. She wore a zio monitor which showed HR's in the 34 with an ave of 50/min.  Allergies  Allergen Reactions   Adhesive [Tape] Itching and Rash   Benazepril Hcl Cough     Current Outpatient Medications  Medication Sig Dispense Refill   flecainide (TAMBOCOR) 50 MG tablet Take 1 tablet by mouth twice daily 60 tablet 5   furosemide (LASIX) 20 MG tablet Take 20 mg by mouth 2 (two) times daily.     hydrOXYzine (ATARAX/VISTARIL) 25 MG tablet Take 25 mg by mouth daily.     oxybutynin (DITROPAN) 5 MG tablet Take 5 mg by mouth 2 (two) times daily.     pravastatin (PRAVACHOL) 80 MG tablet Take 80 mg by mouth at bedtime.      traMADol (ULTRAM) 50 MG tablet Take 50 mg by mouth 3 (three) times daily as needed.     triamcinolone ointment (KENALOG) 0.5 % Apply topically 2 (two) times daily as needed.     warfarin (COUMADIN) 10 MG tablet Take 10 mg by mouth See admin instructions. Take one tablet daily except none of Friday.     No current facility-administered medications for this visit.     Past Medical History:  Diagnosis Date   Allergic rhinitis    Esophageal reflux    Essential hypertension    Factor V Leiden mutation (HCC)    GERD (gastroesophageal reflux disease)    History of cardiac catheterization    Normal coronaries July 2015   History of DVT (deep vein thrombosis)    History of pulmonary embolism    Multiple, status post IVC filter, on Coumadin   Mixed hyperlipidemia    Obstructive sleep apnea    NPSG 05-29-07; AHI 32.9,cpap 7 to 9   On home oxygen therapy    2 liters at night- usual- no recent changes   Osteoarthritis    Persistent atrial fibrillation (HCC)     Documented October 2016   Transfusion history    Last transfusion after umbilical hernia- [facility]    ROS:   All systems reviewed and negative except as noted in the HPI.   Past Surgical History:  Procedure Laterality Date   CHOLECYSTECTOMY     COLONOSCOPY WITH PROPOFOL N/A 06/13/2015   Procedure: COLONOSCOPY WITH PROPOFOL;  Surgeon: Martin K Johnson, MD;  Location: [facility] ENDOSCOPY;  Service: Endoscopy;  Laterality: N/A;   ENDOMETRIAL ABLATION     GANGLION CYST EXCISION     Hernia abscess     HERNIA REPAIR     umbilical   I&D hernia incisional abscess     IR ABLATE LIVER CRYOABLATION  05/12/2019   IR RADIOLOGIST EVAL & MGMT  05/10/2019   IVC filter Right    LEFT HEART CATHETERIZATION WITH CORONARY ANGIOGRAM N/A 08/20/2013   Procedure: LEFT HEART CATHETERIZATION WITH CORONARY ANGIOGRAM;  Surgeon: Michael D Cooper, MD;  Location: [facility] CATH LAB;  Service: Cardiovascular;  Laterality: N/A;   ROTATOR CUFF REPAIR       Family History  Problem Relation Age of Onset   Cancer Mother        Thyroid cancer   Heart   disease Father        Heart attack   Allergies Other    Asthma Other    COPD Other      Social History   Socioeconomic History   Marital status: Widowed    Spouse name: Not on file   Number of children: Not on file   Years of education: Not on file   Highest education level: Not on file  Occupational History   Occupation: Disability  Tobacco Use   Smoking status: Never   Smokeless tobacco: Never   Tobacco comments:    only as a teen for 6 months  Vaping Use   Vaping Use: Never used  Substance and Sexual Activity   Alcohol use: No    Alcohol/week: 0.0 standard drinks of alcohol   Drug use: No   Sexual activity: Not on file  Other Topics Concern   Not on file  Social History Narrative   Widowed-cirrhosis   Social Determinants of Health   Financial Resource Strain: Not on file  Food Insecurity: Not on file  Transportation Needs: Not on file   Physical Activity: Not on file  Stress: Not on file  Social Connections: Not on file  Intimate Partner Violence: Not on file     There were no vitals taken for this visit.  Physical Exam:  Morbidly obese appearing NAD HEENT: Unremarkable Neck:  No JVD, no thyromegally Lymphatics:  No adenopathy Back:  No CVA tenderness Lungs:  Clear with no wheezes HEART:  Regular rate rhythm, no murmurs, no rubs, no clicks Abd:  soft, positive bowel sounds, no organomegally, no rebound, no guarding Ext:  2 plus pulses, no edema, no cyanosis, no clubbing Skin:  No rashes no nodules Neuro:  CN II through XII intact, motor grossly intact  EKG -reviewed SB  Assess/Plan: PAF - she is maintaining NSR on flecainide. Continue Sinus node dysfunction -I have discussed the risks/benefits/goals and expectations of PPM insertion due to symptomatic sinus node dysfunction and she wishes to proceed.  Marvie Brevik,MD 

## 2022-07-04 NOTE — Patient Instructions (Signed)
Medication Instructions:  Hold Coumadin 3 Days Prior to Pacemaker   *If you need a refill on your cardiac medications before your next appointment, please call your pharmacy*   Lab Work: Your physician recommends that you return for lab work in: Today ( BMET, CBC)   If you have labs (blood work) drawn today and your tests are completely normal, you will receive your results only by: MyChart Message (if you have MyChart) OR A paper copy in the mail If you have any lab test that is abnormal or we need to change your treatment, we will call you to review the results.   Testing/Procedures: Your physician has recommended that you have a pacemaker inserted. A pacemaker is a small device that is placed under the skin of your chest or abdomen to help control abnormal heart rhythms. This device uses electrical pulses to prompt the heart to beat at a normal rate. Pacemakers are used to treat heart rhythms that are too slow. Wire (leads) are attached to the pacemaker that goes into the chambers of you heart. This is done in the hospital and usually requires and overnight stay. Please see the instruction sheet given to you today for more information.    Follow-Up: At [facility], you and your health needs are our priority.  As part of our continuing mission to provide you with exceptional heart care, we have created designated Provider Care Teams.  These Care Teams include your primary Cardiologist (physician) and Advanced Practice Providers (APPs -  Physician Assistants and Nurse Practitioners) who all work together to provide you with the care you need, when you need it.  We recommend signing up for the patient portal called "MyChart".  Sign up information is provided on this After Visit Summary.  MyChart is used to connect with patients for Virtual Visits (Telemedicine).  Patients are able to view lab/test results, encounter notes, upcoming appointments, etc.  Non-urgent messages can be sent  to your provider as well.   To learn more about what you can do with MyChart, go to [email or web address].    Your next appointment:    3 Months   Provider:   Lewayne Bunting, MD    Other Instructions    Implantable Device Instructions    Rhonda Acevedo  07/04/2022  You are scheduled for a Permanent transvenous pacemaker (PPM) on Wednesday, June 26 with Dr. Lewayne Bunting.  1. Pre procedure Lab testing:  [facility]     2. Please arrive at the Main Entrance A at [facility]: [address] [place], [place] [address] on June 26 at 12:30 PM (This time is two hours before your procedure to ensure your preparation). Free valet parking service is available. You will check in at ADMITTING. The support person will be asked to wait in the waiting room.  It is OK to have someone drop you off and come back when you are ready to be discharged.        Special note: Every effort is made to have your procedure done on time. Please understand that emergencies sometimes delay  scheduled procedures.  3.  No eating or drinking after midnight prior to procedure.     4.  Medication instructions:  On the morning of your procedure hold your Coumadin (Warfarin) for 3 day(s) prior to your procedure. Your last dose will be Saturday, June 22, PM dose.   5.  The night before your procedure and the morning of your procedure scrub  your neck/chest with CHG surgical scrub.  See instruction letter.  6. Plan to go home the same day, you will only stay overnight if medically necessary. 7.  You MUST have a responsible adult to drive you home. 8.   An adult MUST be with you the first 24 hours after you arrive home. 9..  Bring a current list of your medications, and the last time and date medication taken. 10. Bring ID and current insurance cards. 11. .Please wear clothes that are easy to get on and off and wear slip-on shoes.    You will follow up with the [facility] Device clinic 10-14 days after  your procedure.  You will follow up with Dr. Lewayne Bunting 91 days after your procedure.  These appointments will be made for you.   * If you have ANY questions after you get home, please call the office at [phone] or send a MyChart message.  FYI: For your safety, and to allow [place] to monitor your vital signs accurately during the surgery/procedure we request that if you have artificial nails, gel coating, SNS etc. Please have those removed prior to your surgery/procedure. Not having the nail coverings /polish removed may result in cancellation or delay of your surgery/procedure.    [facility] - Preparing For Surgery    Before surgery, you can play an important role. Because skin is not sterile, your skin needs to be as free of germs as possible. You can reduce the number of germs on your skin by washing with CHG (chlorahexidine gluconate) Soap before surgery.  CHG is an antiseptic cleaner which kills germs and bonds with the skin to continue killing germs even after washing.  Please do not use if you have an allergy to CHG or antibacterial soaps.  If your skin becomes reddened/irritated stop using the CHG.   Do not shave (including legs and underarms) for at least 48 hours prior to first CHG shower.  It is OK to shave your face.  Please follow these instructions carefully:  1.  Shower the night before surgery and the morning of surgery with CHG.  2.  If you choose to wash your hair, wash your hair first as usual with your normal shampoo.  3.  After you shampoo, rinse your hair and body thoroughly to remove the shampoo.  4.  Use CHG as you would any other liquid soap.  You can apply CHG directly to the skin and wash gently with a clean washcloth. 5.  Apply the CHG Soap to your body ONLY FROM THE NECK DOWN.  Do not use on open wounds or open sores.  Avoid contact with your eyes, ears, mouth and genitals (private parts).  Wash genitals (private parts) with your normal soap.  6.  Wash  thoroughly, paying special attention to the area where your surgery will be performed.  7.  Thoroughly rinse your body with warm water from the neck down.   8.  DO NOT shower/wash with your normal soap after using and rinsing off the CHG soap.  9.  Pat yourself dry with a clean towel.           10.  Wear clean pajamas.           11.  Place clean sheets on your bed the night of your first shower and do not sleep with pets.  Day of Surgery: Do not apply any deodorants/lotions.  Please wear clean clothes to the hospital/surgery center.

## 2022-07-08 DIAGNOSIS — Z7901 Long term (current) use of anticoagulants: Secondary | ICD-10-CM | POA: Diagnosis not present

## 2022-07-09 DIAGNOSIS — D124 Benign neoplasm of descending colon: Secondary | ICD-10-CM | POA: Diagnosis not present

## 2022-07-09 DIAGNOSIS — K635 Polyp of colon: Secondary | ICD-10-CM | POA: Diagnosis not present

## 2022-07-09 DIAGNOSIS — D128 Benign neoplasm of rectum: Secondary | ICD-10-CM | POA: Diagnosis not present

## 2022-07-09 DIAGNOSIS — D123 Benign neoplasm of transverse colon: Secondary | ICD-10-CM | POA: Diagnosis not present

## 2022-07-09 DIAGNOSIS — K649 Unspecified hemorrhoids: Secondary | ICD-10-CM | POA: Diagnosis not present

## 2022-07-09 DIAGNOSIS — Z09 Encounter for follow-up examination after completed treatment for conditions other than malignant neoplasm: Secondary | ICD-10-CM | POA: Diagnosis not present

## 2022-07-09 DIAGNOSIS — K5730 Diverticulosis of large intestine without perforation or abscess without bleeding: Secondary | ICD-10-CM | POA: Diagnosis not present

## 2022-07-09 DIAGNOSIS — Z86010 Personal history of colonic polyps: Secondary | ICD-10-CM | POA: Diagnosis not present

## 2022-07-09 DIAGNOSIS — D125 Benign neoplasm of sigmoid colon: Secondary | ICD-10-CM | POA: Diagnosis not present

## 2022-07-11 DIAGNOSIS — D124 Benign neoplasm of descending colon: Secondary | ICD-10-CM | POA: Diagnosis not present

## 2022-07-11 DIAGNOSIS — D125 Benign neoplasm of sigmoid colon: Secondary | ICD-10-CM | POA: Diagnosis not present

## 2022-07-11 DIAGNOSIS — D123 Benign neoplasm of transverse colon: Secondary | ICD-10-CM | POA: Diagnosis not present

## 2022-07-11 DIAGNOSIS — D128 Benign neoplasm of rectum: Secondary | ICD-10-CM | POA: Diagnosis not present

## 2022-07-11 DIAGNOSIS — K635 Polyp of colon: Secondary | ICD-10-CM | POA: Diagnosis not present

## 2022-07-16 NOTE — Pre-Procedure Instructions (Signed)
Instructed patient on the following items: Arrival time 1230 Nothing to eat or drink after midnight No meds AM of procedure Responsible person to drive you home and stay with you for 24 hrs Wash with special soap night before and morning of procedure  

## 2022-07-17 ENCOUNTER — Ambulatory Visit (HOSPITAL_COMMUNITY)
Admission: RE | Admit: 2022-07-17 | Discharge: 2022-07-18 | Disposition: A | Discharge status: Home / Self Care | Payer: PPO | Attending: Internal Medicine | Admitting: Internal Medicine

## 2022-07-17 ENCOUNTER — Encounter (HOSPITAL_COMMUNITY)
Admission: RE | Disposition: A | Discharge status: Home / Self Care | Payer: Self-pay | Source: Home / Self Care | Attending: Internal Medicine

## 2022-07-17 ENCOUNTER — Other Ambulatory Visit: Payer: Self-pay

## 2022-07-17 DIAGNOSIS — I4819 Other persistent atrial fibrillation: Secondary | ICD-10-CM | POA: Diagnosis not present

## 2022-07-17 DIAGNOSIS — I495 Sick sinus syndrome: Secondary | ICD-10-CM | POA: Diagnosis not present

## 2022-07-17 DIAGNOSIS — Z86718 Personal history of other venous thrombosis and embolism: Secondary | ICD-10-CM | POA: Diagnosis not present

## 2022-07-17 DIAGNOSIS — Z79899 Other long term (current) drug therapy: Secondary | ICD-10-CM | POA: Diagnosis not present

## 2022-07-17 DIAGNOSIS — Z7901 Long term (current) use of anticoagulants: Secondary | ICD-10-CM | POA: Diagnosis not present

## 2022-07-17 DIAGNOSIS — Z86711 Personal history of pulmonary embolism: Secondary | ICD-10-CM | POA: Insufficient documentation

## 2022-07-17 HISTORY — PX: PACEMAKER IMPLANT: EP1218

## 2022-07-17 LAB — PROTIME-INR
INR: 1.2 (ref 0.8–1.2)
Prothrombin Time: 15.1 seconds (ref 11.4–15.2)

## 2022-07-17 SURGERY — PACEMAKER IMPLANT

## 2022-07-17 MED ORDER — ACETAMINOPHEN 325 MG PO TABS
325.0000 mg | ORAL_TABLET | ORAL | Status: DC | PRN
  Administered 2022-07-17 – 2022-07-18 (×4): 650 mg via ORAL
  Filled 2022-07-17 (×4): qty 2

## 2022-07-17 MED ORDER — CEFAZOLIN SODIUM-DEXTROSE 2-4 GM/100ML-% IV SOLN
INTRAVENOUS | Status: AC
  Filled 2022-07-17: qty 100

## 2022-07-17 MED ORDER — SODIUM CHLORIDE 0.9 % IV SOLN: INTRAVENOUS | Status: DC

## 2022-07-17 MED ORDER — SODIUM CHLORIDE 0.9 % IV SOLN
INTRAVENOUS | Status: AC
  Filled 2022-07-17: qty 2

## 2022-07-17 MED ORDER — CEFAZOLIN IN SODIUM CHLORIDE 3-0.9 GM/100ML-% IV SOLN
3.0000 g | INTRAVENOUS | Status: AC
  Administered 2022-07-17: 3 g via INTRAVENOUS
  Filled 2022-07-17: qty 100

## 2022-07-17 MED ORDER — FENTANYL CITRATE (PF) 100 MCG/2ML IJ SOLN
INTRAMUSCULAR | Status: DC | PRN
  Administered 2022-07-17: 12.5 ug via INTRAVENOUS

## 2022-07-17 MED ORDER — CHLORHEXIDINE GLUCONATE 4 % EX SOLN: 4.0000 | Freq: Once | CUTANEOUS | Status: DC

## 2022-07-17 MED ORDER — ALBUTEROL SULFATE (2.5 MG/3ML) 0.083% IN NEBU: 3.0000 mL | INHALATION_SOLUTION | Freq: Four times a day (QID) | RESPIRATORY_TRACT | Status: DC | PRN

## 2022-07-17 MED ORDER — POVIDONE-IODINE 10 % EX SWAB: 2.0000 | Freq: Once | CUTANEOUS | Status: DC

## 2022-07-17 MED ORDER — MIDAZOLAM HCL 5 MG/5ML IJ SOLN
INTRAMUSCULAR | Status: DC | PRN
  Administered 2022-07-17: 1 mg via INTRAVENOUS

## 2022-07-17 MED ORDER — CEFAZOLIN SODIUM-DEXTROSE 1-4 GM/50ML-% IV SOLN
1.0000 g | Freq: Four times a day (QID) | INTRAVENOUS | Status: AC
  Administered 2022-07-17 – 2022-07-18 (×3): 1 g via INTRAVENOUS
  Filled 2022-07-17 (×4): qty 50

## 2022-07-17 MED ORDER — ONDANSETRON HCL 4 MG/2ML IJ SOLN: 4.0000 mg | Freq: Four times a day (QID) | INTRAMUSCULAR | Status: DC | PRN

## 2022-07-17 MED ORDER — LIDOCAINE HCL (PF) 1 % IJ SOLN
INTRAMUSCULAR | Status: DC | PRN
  Administered 2022-07-17: 60 mL

## 2022-07-17 MED ORDER — LIDOCAINE HCL (PF) 1 % IJ SOLN
INTRAMUSCULAR | Status: AC
  Filled 2022-07-17: qty 60

## 2022-07-17 MED ORDER — FENTANYL CITRATE (PF) 100 MCG/2ML IJ SOLN
INTRAMUSCULAR | Status: AC
  Filled 2022-07-17: qty 2

## 2022-07-17 MED ORDER — FLECAINIDE ACETATE 50 MG PO TABS
50.0000 mg | ORAL_TABLET | Freq: Two times a day (BID) | ORAL | Status: DC
  Administered 2022-07-17 – 2022-07-18 (×2): 50 mg via ORAL
  Filled 2022-07-17 (×4): qty 1

## 2022-07-17 MED ORDER — HEPARIN (PORCINE) IN NACL 1000-0.9 UT/500ML-% IV SOLN
INTRAVENOUS | Status: DC | PRN
  Administered 2022-07-17: 500 mL

## 2022-07-17 MED ORDER — MIDAZOLAM HCL 2 MG/2ML IJ SOLN
INTRAMUSCULAR | Status: AC
  Filled 2022-07-17: qty 2

## 2022-07-17 MED ORDER — SODIUM CHLORIDE 0.9 % IV SOLN
80.0000 mg | INTRAVENOUS | Status: AC
  Administered 2022-07-17: 80 mg

## 2022-07-17 SURGICAL SUPPLY — 9 items
CABLE SURGICAL S-101-97-12 (CABLE) ×1 IMPLANT
IPG PACE AZUR XT DR MRI W1DR01 (Pacemaker) IMPLANT
KIT MICROPUNCTURE NIT STIFF (SHEATH) IMPLANT
LEAD CAPSURE NOVUS 5076-52CM (Lead) IMPLANT
LEAD CAPSURE NOVUS 5076-58CM (Lead) IMPLANT
PACE AZURE XT DR MRI W1DR01 (Pacemaker) ×1 IMPLANT
PAD DEFIB RADIO PHYSIO CONN (PAD) ×1 IMPLANT
SHEATH 7FR PRELUDE SNAP 13 (SHEATH) IMPLANT
TRAY PACEMAKER INSERTION (PACKS) ×1 IMPLANT

## 2022-07-17 NOTE — Progress Notes (Signed)
Orthopedic Tech Progress Note Patient Details:  Rhonda Acevedo 02-May-1950 [id number]  Ortho Devices Type of Ortho Device: Arm sling Ortho Device/Splint Location: LUE Ortho Device/Splint Interventions: Ordered, Application, Adjustment   Post Interventions Patient Tolerated: Well Instructions Provided: Care of device  [name] 07/17/2022, 6:37 PM

## 2022-07-17 NOTE — Interval H&P Note (Signed)
History and Physical Interval Note:  07/17/2022 12:51 PM  Rhonda Acevedo  has presented today for surgery, with the diagnosis of symptomatic synus node dysfunction.  The various methods of treatment have been discussed with the patient and family. After consideration of risks, benefits and other options for treatment, the patient has consented to  Procedure(s): PACEMAKER IMPLANT (N/A) as a surgical intervention.  The patient's history has been reviewed, patient examined, no change in status, stable for surgery.  I have reviewed the patient's chart and labs.  Questions were answered to the patient's satisfaction.     Lewayne Bunting

## 2022-07-17 NOTE — Interval H&P Note (Signed)
History and Physical Interval Note:  07/17/2022 12:52 PM  Rhonda Acevedo  has presented today for surgery, with the diagnosis of symptomatic synus node dysfunction.  The various methods of treatment have been discussed with the patient and family. After consideration of risks, benefits and other options for treatment, the patient has consented to  Procedure(s): PACEMAKER IMPLANT (N/A) as a surgical intervention.  The patient's history has been reviewed, patient examined, no change in status, stable for surgery.  I have reviewed the patient's chart and labs.  Questions were answered to the patient's satisfaction.     Lewayne Bunting

## 2022-07-17 NOTE — Progress Notes (Signed)
   07/17/22 1718  Vitals  Temp 98.1 F (36.7 C)  Temp Source Oral  BP (!) 159/83  MAP (mmHg) 106  BP Location Left Wrist  BP Method Automatic  Patient Position (if appropriate) Lying  Pulse Rate (!) 59  ECG Heart Rate 60  Resp 15  MEWS COLOR  MEWS Score Color Green  Oxygen Therapy  SpO2 100 %  O2 Device Room Air  MEWS Score  MEWS Temp 0  MEWS Systolic 0  MEWS Pulse 0  MEWS RR 0  MEWS LOC 0  MEWS Score 0   Patient arrived from cath lab to 4e14, patient with left chest dressing. Vital signs obtained and ccmd made aware. Patient oriented to room family at bedside. Lore Polka, Randall An RN

## 2022-07-18 ENCOUNTER — Ambulatory Visit: Payer: PPO | Admitting: Podiatry

## 2022-07-18 ENCOUNTER — Ambulatory Visit (HOSPITAL_COMMUNITY): Payer: PPO

## 2022-07-18 ENCOUNTER — Other Ambulatory Visit (HOSPITAL_COMMUNITY): Payer: Self-pay

## 2022-07-18 ENCOUNTER — Encounter (HOSPITAL_COMMUNITY): Payer: Self-pay | Admitting: Internal Medicine

## 2022-07-18 DIAGNOSIS — Z86718 Personal history of other venous thrombosis and embolism: Secondary | ICD-10-CM | POA: Diagnosis not present

## 2022-07-18 DIAGNOSIS — I771 Stricture of artery: Secondary | ICD-10-CM | POA: Diagnosis not present

## 2022-07-18 DIAGNOSIS — Z79899 Other long term (current) drug therapy: Secondary | ICD-10-CM | POA: Diagnosis not present

## 2022-07-18 DIAGNOSIS — Z7901 Long term (current) use of anticoagulants: Secondary | ICD-10-CM | POA: Diagnosis not present

## 2022-07-18 DIAGNOSIS — I495 Sick sinus syndrome: Secondary | ICD-10-CM | POA: Diagnosis not present

## 2022-07-18 DIAGNOSIS — Z950 Presence of cardiac pacemaker: Secondary | ICD-10-CM | POA: Diagnosis not present

## 2022-07-18 DIAGNOSIS — I4819 Other persistent atrial fibrillation: Secondary | ICD-10-CM | POA: Diagnosis not present

## 2022-07-18 DIAGNOSIS — Z86711 Personal history of pulmonary embolism: Secondary | ICD-10-CM | POA: Diagnosis not present

## 2022-07-18 MED ORDER — METOPROLOL SUCCINATE ER 25 MG PO TB24
25.0000 mg | ORAL_TABLET | Freq: Every day | ORAL | 11 refills | Status: DC
  Filled 2022-07-18: qty 30, 30d supply, fill #0

## 2022-07-18 NOTE — Plan of Care (Signed)
  Problem: Education: Goal: Knowledge of cardiac device and self-care will improve Outcome: Adequate for Discharge Goal: Ability to safely manage health related needs after discharge will improve Outcome: Adequate for Discharge   Problem: Cardiac: Goal: Ability to achieve and maintain adequate cardiopulmonary perfusion will improve Outcome: Adequate for Discharge   Problem: Education: Goal: Knowledge of General Education information will improve Description: Including pain rating scale, medication(s)/side effects and non-pharmacologic comfort measures Outcome: Adequate for Discharge   Problem: Health Behavior/Discharge Planning: Goal: Ability to manage health-related needs will improve Outcome: Adequate for Discharge   Problem: Clinical Measurements: Goal: Ability to maintain clinical measurements within normal limits will improve Outcome: Adequate for Discharge Goal: Will remain free from infection Outcome: Adequate for Discharge Goal: Diagnostic test results will improve Outcome: Adequate for Discharge Goal: Respiratory complications will improve Outcome: Adequate for Discharge Goal: Cardiovascular complication will be avoided Outcome: Adequate for Discharge   Problem: Activity: Goal: Risk for activity intolerance will decrease Outcome: Adequate for Discharge   Problem: Nutrition: Goal: Adequate nutrition will be maintained Outcome: Adequate for Discharge   Problem: Coping: Goal: Level of anxiety will decrease Outcome: Adequate for Discharge   Problem: Elimination: Goal: Will not experience complications related to bowel motility Outcome: Adequate for Discharge Goal: Will not experience complications related to urinary retention Outcome: Adequate for Discharge   Problem: Pain Managment: Goal: General experience of comfort will improve Outcome: Adequate for Discharge   Problem: Safety: Goal: Ability to remain free from injury will improve Outcome: Adequate for  Discharge   Problem: Skin Integrity: Goal: Risk for impaired skin integrity will decrease Outcome: Adequate for Discharge

## 2022-07-18 NOTE — Discharge Instructions (Addendum)
After Your Pacemaker   You have a Medtronic Pacemaker  ACTIVITY Do not lift your arm above shoulder height for 1 week after your procedure. After 7 days, you may progress as below.  You should remove your sling 24 hours after your procedure, unless otherwise instructed by your provider.     Thursday July 25, 2022  Friday July 26, 2022 Saturday July 27, 2022 Sunday July 28, 2022   Do not lift, push, pull, or carry anything over 10 pounds with the affected arm until 6 weeks (Thursday August 29, 2022 ) after your procedure.   You may drive AFTER your wound check, unless you have been told otherwise by your provider.   Ask your healthcare provider when you can go back to work   INCISION/Dressing Resume your usual warfarin dosing schedule today, you will need an INR check by Wed (07/24/22) or Friday of next week (07/26/22)  Monitor your Pacemaker site for redness, swelling, and drainage. Call the device clinic at [phone] if you experience these symptoms or fever/chills.  If your incision is sealed with Steri-strips or staples, you may shower 7 days after your procedure or when told by your provider. Do not remove the steri-strips or let the shower hit directly on your site. You may wash around your site with soap and water.     Avoid lotions, ointments, or perfumes over your incision until it is well-healed.  You may use a hot tub or a pool AFTER your wound check appointment if the incision is completely closed.  Pacemaker Alerts:  Some alerts are vibratory and others beep. These are NOT emergencies. Please call our office to let us know. If this occurs at night or on weekends, it can wait until the next business day. Send a remote transmission.  If your device is capable of reading fluid status (for heart failure), you will be offered monthly monitoring to review this with you.   DEVICE MANAGEMENT Remote monitoring is used to monitor your pacemaker from home. This monitoring is scheduled  every 91 days by our office. It allows [place] to keep an eye on the functioning of your device to ensure it is working properly. You will routinely see your Electrophysiologist annually (more often if necessary).   You should receive your ID card for your new device in 4-8 weeks. Keep this card with you at all times once received. Consider wearing a medical alert bracelet or necklace.  Your Pacemaker may be MRI compatible. This will be discussed at your next office visit/wound check.  You should avoid contact with strong electric or magnetic fields.   Do not use amateur (ham) radio equipment or electric (arc) welding torches. MP3 player headphones with magnets should not be used. Some devices are safe to use if held at least 12 inches (30 cm) from your Pacemaker. These include power tools, lawn mowers, and speakers. If you are unsure if something is safe to use, ask your health care provider.  When using your cell phone, hold it to the ear that is on the opposite side from the Pacemaker. Do not leave your cell phone in a pocket over the Pacemaker.  You may safely use electric blankets, heating pads, computers, and microwave ovens.  Call the office right away if: You have chest pain. You feel more short of breath than you have felt before. You feel more light-headed than you have felt before. Your incision starts to open up.  This information is not intended to replace  advice given to you by your health care provider. Make sure you discuss any questions you have with your health care provider.

## 2022-07-18 NOTE — Progress Notes (Signed)
Explained discharge instructions to patient. Reviewed follow up appointment and next medication administration times. Also reviewed education. Patient verbalized having an understanding for instructions given. All belongings are in the patient's possession to include TOC meds. IV and telemetry were removed. CCMD was notified. No other needs verbalized. Transported downstairs for discharge. 

## 2022-07-18 NOTE — Discharge Summary (Addendum)
ELECTROPHYSIOLOGY PROCEDURE DISCHARGE SUMMARY    Patient ID: Rhonda Acevedo,  MRN: [id number], DOB/AGE: [birth year] 72 y.o.  Admit date: 07/17/2022 Discharge date: 07/18/2022  Primary Care Physician: Georgann Housekeeper, MD  Primary Cardiologist: Dr. Diona Browner Electrophysiologist: Dr. Ladona Ridgel  Primary Discharge Diagnosis:  Symptomatic bradycardia status post pacemaker implantation this admission  Secondary Discharge Diagnosis:  Paroxysmal Afib CHA2DS2Vasc is 2, on warfarin Also has hx of DVT/PE  Allergies  Allergen Reactions   Adhesive [Tape] Itching and Rash   Benazepril Hcl Cough     Procedures This Admission:  1.  Implantation of a MDT dual chamber PPM on 07/17/22 by Dr Ladona Ridgel.   There were no immediate post procedure complications. CXR on 07/18/22 demonstrated no pneumothorax status post device implantation.   Brief HPI: Rhonda Acevedo is a 72 y.o. female was referred to electrophysiology in the outpatient setting for consideration of PPM implantation.  Past medical history includes above.  The patient has had symptomatic bradycardia and Afib well controlled with her flecainide, discussed management options and planned for PPM  Risks, benefits, and alternatives to PPM implantation were reviewed with the patient who wished to proceed.   Hospital Course:  The patient was admitted and underwent implantation of a PPM with details as outlined in the procedure report.  She was monitored on telemetry overnight which demonstrated SR and AP/VS.  Left chest was without hematoma or ecchymosis.  The device was interrogated and found to be functioning normally.  CXR was obtained and demonstrated no pneumothorax status post device implantation.  Wound care, arm mobility, and restrictions were reviewed with the patient.  The patient feels well, denies any CP/SOB, with minimal site discomfort.  She was examined by Dr. Ladona Ridgel and considered stable for discharge to home.   She will be able to  get into her PMD office for an INR check, discussed July 4th/holiday, to have her INR checked either Wed the 3rd of Friday the 5th  Will add Toprol to her flecainide    Physical Exam: Vitals:   07/17/22 2014 07/17/22 2324 07/18/22 0525 07/18/22 0843  BP: (!) 146/63 (!) 153/61 (!) 154/65 130/63  Pulse: (!) 59 (!) 59 60 (!) 59  Resp: 18 20 18 20   Temp: 98 F (36.7 C) 98.2 F (36.8 C) 98.2 F (36.8 C) 97.7 F (36.5 C)  TempSrc: Oral Oral Oral Oral  SpO2: 98% 95% 96% 96%  Weight:      Height:        GEN- The patient is well appearing, alert and oriented x 3 today.   HEENT: normocephalic, atraumatic; sclera clear, conjunctiva pink; hearing intact; oropharynx clear; neck supple, no JVP Lungs- CTA b/l, normal work of breathing.  No wheezes, rales, rhonchi Heart- RRR, no murmurs, rubs or gallops, PMI not laterally displaced GI- soft, non-tender, non-distended Extremities- no clubbing, cyanosis, or edema MS- no significant deformity or atrophy Skin- warm and dry, no rash or lesion, left chest without hematoma/ecchymosis Psych- euthymic mood, full affect Neuro- no gross deficits   Labs:   Lab Results  Component Value Date   WBC 4.0 07/04/2022   HGB 12.5 07/04/2022   HCT 39.1 07/04/2022   MCV 93.8 07/04/2022   PLT 198 07/04/2022   No results for input(s): "NA", "K", "CL", "CO2", "BUN", "CREATININE", "CALCIUM", "PROT", "BILITOT", "ALKPHOS", "ALT", "AST", "GLUCOSE" in the last 168 hours.  Invalid input(s): "LABALBU"  Discharge Medications:  Allergies as of 07/18/2022       Reactions  Adhesive [tape] Itching, Rash   Benazepril Hcl Cough        Medication List     TAKE these medications    albuterol 108 (90 Base) MCG/ACT inhaler Commonly known as: VENTOLIN HFA Inhale 2 puffs into the lungs every 6 (six) hours as needed for wheezing or shortness of breath.   flecainide 50 MG tablet Commonly known as: TAMBOCOR Take 1 tablet by mouth twice daily   furosemide 20 MG  tablet Commonly known as: LASIX Take 40 mg by mouth daily.   hydrOXYzine 25 MG tablet Commonly known as: ATARAX Take 25 mg by mouth daily.   metoprolol succinate 25 MG 24 hr tablet Commonly known as: Toprol XL Take 1 tablet (25 mg total) by mouth daily.   oxybutynin 5 MG tablet Commonly known as: DITROPAN Take 5 mg by mouth 2 (two) times daily.   pravastatin 80 MG tablet Commonly known as: PRAVACHOL Take 80 mg by mouth at bedtime.   traMADol 50 MG tablet Commonly known as: ULTRAM Take 50 mg by mouth 3 (three) times daily as needed for severe pain.   triamcinolone ointment 0.5 % Commonly known as: KENALOG Apply 1 Application topically 2 (two) times daily as needed (irritation).   warfarin 5 MG tablet Commonly known as: COUMADIN Take 5 mg by mouth every Monday, Wednesday, and Friday.   warfarin 7.5 MG tablet Commonly known as: COUMADIN Take 7.5 mg by mouth every Tuesday, Thursday, Saturday, and Sunday.        Disposition: Home Discharge Instructions     Diet - low sodium heart healthy   Complete by: As directed    Increase activity slowly   Complete by: As directed         Duration of Discharge Encounter: Greater than 30 minutes including physician time.  Norma Fredrickson, PA-C 07/18/2022 9:58 AM  EP Attending  Patient seen and examined. Agree with above. She has a RRR and clear lungs. Incision is without hematoma. CXR demonstrates no PTX. Atrial lead has pulled back some. Interrogation of her device under my direct supervision demonstrates normal DDD PM function. She will be discharged home with usual followup.   Sharlot Gowda Amaro Mangold,MD

## 2022-07-24 DIAGNOSIS — Z7901 Long term (current) use of anticoagulants: Secondary | ICD-10-CM | POA: Diagnosis not present

## 2022-07-31 NOTE — Patient Instructions (Signed)
   After Your Pacemaker   Monitor your pacemaker site for redness, swelling, and drainage. Call the device clinic at [phone] if you experience these symptoms or fever/chills.  Your incision was closed with Steri-strips or staples:  You may shower 7 days after your procedure and wash your incision with soap and water. Avoid lotions, ointments, or perfumes over your incision until it is well-healed.  You may use a hot tub or a pool after your wound check appointment if the incision is completely closed.  Do not lift, push or pull greater than 10 pounds with the affected arm until 6 weeks after your procedure. AFTER [facility].  There are no other restrictions in arm movement after your wound check appointment.  You may drive, unless driving has been restricted by your healthcare providers.  Remote monitoring is used to monitor your pacemaker from home. This monitoring is scheduled every 91 days by our office. It allows [place] to keep an eye on the functioning of your device to ensure it is working properly. You will routinely see your Electrophysiologist annually (more often if necessary).

## 2022-08-01 ENCOUNTER — Ambulatory Visit: Payer: PPO

## 2022-08-01 DIAGNOSIS — Z7901 Long term (current) use of anticoagulants: Secondary | ICD-10-CM | POA: Diagnosis not present

## 2022-08-01 DIAGNOSIS — I495 Sick sinus syndrome: Secondary | ICD-10-CM | POA: Diagnosis not present

## 2022-08-02 LAB — CUP PACEART INCLINIC DEVICE CHECK
Battery Remaining Longevity: 149 mo
Battery Voltage: 3.22 V
Brady Statistic AP VP Percent: 0.06 %
Brady Statistic AP VS Percent: 95.96 %
Brady Statistic AS VP Percent: 0 %
Brady Statistic AS VS Percent: 3.97 %
Brady Statistic RA Percent Paced: 96.1 %
Brady Statistic RV Percent Paced: 0.06 %
Date Time Interrogation Session: 20240711122600
Implantable Lead Connection Status: 753985
Implantable Lead Connection Status: 753985
Implantable Lead Implant Date: 20240626
Implantable Lead Implant Date: 20240626
Implantable Lead Location: 753859
Implantable Lead Location: 753860
Implantable Lead Model: 5076
Implantable Lead Model: 5076
Implantable Pulse Generator Implant Date: 20240626
Lead Channel Impedance Value: 380 Ohm
Lead Channel Impedance Value: 532 Ohm
Lead Channel Impedance Value: 551 Ohm
Lead Channel Impedance Value: 646 Ohm
Lead Channel Pacing Threshold Amplitude: 0.5 V
Lead Channel Pacing Threshold Amplitude: 0.625 V
Lead Channel Pacing Threshold Amplitude: 0.75 V
Lead Channel Pacing Threshold Pulse Width: 0.4 ms
Lead Channel Pacing Threshold Pulse Width: 0.4 ms
Lead Channel Pacing Threshold Pulse Width: 0.4 ms
Lead Channel Sensing Intrinsic Amplitude: 13.375 mV
Lead Channel Sensing Intrinsic Amplitude: 15.25 mV
Lead Channel Sensing Intrinsic Amplitude: 2.875 mV
Lead Channel Sensing Intrinsic Amplitude: 2.875 mV
Lead Channel Setting Pacing Amplitude: 3.5 V
Lead Channel Setting Pacing Amplitude: 3.5 V
Lead Channel Setting Pacing Pulse Width: 0.4 ms
Lead Channel Setting Sensing Sensitivity: 1.2 mV
Zone Setting Status: 755011

## 2022-08-02 NOTE — Progress Notes (Signed)
Wound check appointment. Steri-strips removed. Wound without redness or edema. Incision edges approximated, wound well healed. Normal device function. Thresholds, sensing, and impedances consistent with implant measurements. Device programmed at 3.5V/auto capture programmed on for extra safety margin until 3 month visit. Histogram distribution is flat and patient complaints of even less energy for ADLs than before device.  Attempted to turn on Rate Response (see notes below).  AT/AF burden 0%, no high ventricular rates noted. Patient educated about wound care, arm mobility, lifting restrictions. ROV in 3 months with implanting physician.  PROGRAMMING RATE RESPONSE: 3 ATTEMPTS: 1.  initially with nominal settings - patient felt SOB, dizzy and immediate "whoosh" when standing and walking. 2.  next attempt - turned threshold from low to med-low and ADL rate from 95 - 105, walking test was better but then became dizzy before returning to the room.  3.  Final attempt - Left threshold at med-low, turned ADL rate back to 95.  - initial walk test went very well, then when leaving, patient became very sx again with dizziness and determined best to turn rate response back off completely since paitent feeling worse than when she cam in.  I suspect there may be more going on with her dizziness with positional change and have asked her to obtain home bp readings including orthostatics to review further with Dr. Ladona Ridgel and Dr. Diona Browner.  Today orthostatics: sitting 119/79, standing 110/60's.  Patient to call our office next week with update on how she is feeling and BP readings.

## 2022-08-08 ENCOUNTER — Encounter: Payer: Self-pay | Admitting: Internal Medicine

## 2022-08-13 ENCOUNTER — Ambulatory Visit: Payer: PPO | Admitting: Podiatry

## 2022-08-22 ENCOUNTER — Ambulatory Visit: Payer: PPO | Admitting: Orthopaedic Surgery

## 2022-08-22 ENCOUNTER — Encounter: Payer: Self-pay | Admitting: Podiatry

## 2022-08-22 ENCOUNTER — Encounter: Payer: Self-pay | Admitting: Orthopaedic Surgery

## 2022-08-22 ENCOUNTER — Ambulatory Visit: Payer: PPO | Admitting: Podiatry

## 2022-08-22 DIAGNOSIS — D689 Coagulation defect, unspecified: Secondary | ICD-10-CM | POA: Diagnosis not present

## 2022-08-22 DIAGNOSIS — M79676 Pain in unspecified toe(s): Secondary | ICD-10-CM

## 2022-08-22 DIAGNOSIS — B351 Tinea unguium: Secondary | ICD-10-CM | POA: Diagnosis not present

## 2022-08-22 DIAGNOSIS — D2371 Other benign neoplasm of skin of right lower limb, including hip: Secondary | ICD-10-CM

## 2022-08-22 DIAGNOSIS — M25562 Pain in left knee: Secondary | ICD-10-CM | POA: Diagnosis not present

## 2022-08-22 DIAGNOSIS — M1712 Unilateral primary osteoarthritis, left knee: Secondary | ICD-10-CM | POA: Diagnosis not present

## 2022-08-22 DIAGNOSIS — G8929 Other chronic pain: Secondary | ICD-10-CM | POA: Diagnosis not present

## 2022-08-22 DIAGNOSIS — D2372 Other benign neoplasm of skin of left lower limb, including hip: Secondary | ICD-10-CM

## 2022-08-22 DIAGNOSIS — M1711 Unilateral primary osteoarthritis, right knee: Secondary | ICD-10-CM | POA: Diagnosis not present

## 2022-08-22 DIAGNOSIS — M25561 Pain in right knee: Secondary | ICD-10-CM

## 2022-08-22 MED ORDER — METHYLPREDNISOLONE ACETATE 40 MG/ML IJ SUSP
40.0000 mg | INTRAMUSCULAR | Status: AC | PRN
Start: 2022-08-22 — End: 2022-08-22
  Administered 2022-08-22: 40 mg via INTRA_ARTICULAR

## 2022-08-22 MED ORDER — LIDOCAINE HCL 1 % IJ SOLN
3.0000 mL | INTRAMUSCULAR | Status: AC | PRN
Start: 2022-08-22 — End: 2022-08-22
  Administered 2022-08-22: 3 mL

## 2022-08-22 NOTE — Progress Notes (Signed)
The patient comes in today requesting steroid injections in both her knees.  We did this about every 3 months.  She is morbidly obese and has not been a surgical candidate due to multiple comorbidities.  She actually recently had a pacemaker placed in June secondary to bradycardia.  She is on blood thinning medication as well.  She says the steroid injections do not last as long as she would like but she understands that is really her only option now.  She does ambulate with a rolling walker.  Per request I did place a steroid injection in both knees today.  Both knees do have a decent soft tissue envelope around the knees but no effusion.  I would like to see her back in 3 months.  At that visit I would like a new weight and BMI calculation.  We can play steroid injections and then again if needed.    Procedure Note  Patient: Rhonda Acevedo             Date of Birth: Mar 10, 1950           MRN: [id number]             Visit Date: 08/22/2022  Procedures: Visit Diagnoses:  1. Chronic pain of left knee   2. Chronic pain of right knee   3. Unilateral primary osteoarthritis, left knee   4. Severe obesity (BMI >= 40) (HCC)   5. Unilateral primary osteoarthritis, right knee     Large Joint Inj: R knee on 08/22/2022 1:46 PM Indications: diagnostic evaluation and pain Details: 22 G 1.5 in needle, superolateral approach  Arthrogram: No  Medications: 3 mL lidocaine 1 %; 40 mg methylPREDNISolone acetate 40 MG/ML Outcome: tolerated well, no immediate complications Procedure, treatment alternatives, risks and benefits explained, specific risks discussed. Consent was given by the patient. Immediately prior to procedure a time out was called to verify the correct patient, procedure, equipment, support staff and site/side marked as required. Patient was prepped and draped in the usual sterile fashion.    Large Joint Inj: L knee on 08/22/2022 1:46 PM Indications: diagnostic evaluation and pain Details:  22 G 1.5 in needle, superolateral approach  Arthrogram: No  Medications: 3 mL lidocaine 1 %; 40 mg methylPREDNISolone acetate 40 MG/ML Outcome: tolerated well, no immediate complications Procedure, treatment alternatives, risks and benefits explained, specific risks discussed. Consent was given by the patient. Immediately prior to procedure a time out was called to verify the correct patient, procedure, equipment, support staff and site/side marked as required. Patient was prepped and draped in the usual sterile fashion.

## 2022-08-22 NOTE — Progress Notes (Signed)
She presents today chief complaint of painful elongated toenails and calluses plantar aspect of the forefoot bilaterally exquisitely painful with ambulation.  Objective: Vital signs stable oriented x 3 pulses are palpable there is no erythema minimal edema no cellulitis drainage or odor no open lesions or wounds.  Her toenails are long thick yellow dystrophic clinically mycotic painful palpation as well as debridement.  Also has benign skin lesion subfirst metatarsophalangeal joint bilateral and subfifth left.  Assessment: Pain in limb secondary to onychomycosis benign skin lesions bilateral less than 4.  Plan: Debrided benign skin lesions bilateral and debrided toenails 1 through 5 bilateral

## 2022-08-30 DIAGNOSIS — Z7901 Long term (current) use of anticoagulants: Secondary | ICD-10-CM | POA: Diagnosis not present

## 2022-09-17 DIAGNOSIS — Z7901 Long term (current) use of anticoagulants: Secondary | ICD-10-CM | POA: Diagnosis not present

## 2022-09-30 ENCOUNTER — Other Ambulatory Visit: Payer: Self-pay | Admitting: Cardiology

## 2022-10-02 ENCOUNTER — Encounter: Payer: Self-pay | Admitting: Student

## 2022-10-02 ENCOUNTER — Ambulatory Visit: Payer: PPO | Attending: Student | Admitting: Student

## 2022-10-02 VITALS — BP 132/80 | HR 79 | Ht 66.5 in | Wt 277.0 lb

## 2022-10-02 DIAGNOSIS — I480 Paroxysmal atrial fibrillation: Secondary | ICD-10-CM | POA: Diagnosis not present

## 2022-10-02 DIAGNOSIS — D6851 Activated protein C resistance: Secondary | ICD-10-CM

## 2022-10-02 DIAGNOSIS — I77819 Aortic ectasia, unspecified site: Secondary | ICD-10-CM

## 2022-10-02 DIAGNOSIS — G4733 Obstructive sleep apnea (adult) (pediatric): Secondary | ICD-10-CM

## 2022-10-02 DIAGNOSIS — Z79899 Other long term (current) drug therapy: Secondary | ICD-10-CM | POA: Diagnosis not present

## 2022-10-02 DIAGNOSIS — I495 Sick sinus syndrome: Secondary | ICD-10-CM | POA: Diagnosis not present

## 2022-10-02 NOTE — Progress Notes (Signed)
Cardiology Office Note    Date:  10/02/2022  ID:  Rhonda Acevedo, DOB 08/28/1950, MRN [id number] Cardiologist: Nona Dell, MD   EP: Dr. Ladona Ridgel  History of Present Illness:    Rhonda Acevedo is a 72 y.o. female with past medical history of paroxysmal atrial fibrillation, history of chest pain (catheterization 2015 showing normal coronary arteries), history of PE/DVTs, factor V Leiden mutation, HLD and OSA who presents to the office today for 50-month follow-up.  She was examined by Dr. Diona Browner in 06/2022 and reported occasional episodes of lightheadedness and intermittent palpitations. She was in sinus bradycardia by EKG and recent monitor had shown sinus bradycardia with an average heart rate of 50 bpm but no sustained pauses or evidence of heart block. She was referred to EP for further evaluation. She met with Dr. Ladona Ridgel later that month and he did recommend PPM placement for sinus node dysfunction. She underwent placement of a Medtronic dual-chamber pacemaker on 07/17/2022 with no immediate complications noted. At the time of her wound check, she developed worsening dizziness and shortness of breath with rate response. It was felt that her dizziness was possibly due to positional changes and orthostatics were checked that day and negative.  In talking with the patient today, she reports overall feeling significantly better following PPM placement. Reports only having dizziness when she was on Toprol-XL and this resolved after stopping the medication in 07/2022. She reports her BP was soft at that time. She brings with her today her blood pressure log and readings have improved with SBP typically in the 110's  Reports her breathing has been stable with no specific orthopnea, PND or progressive dyspnea on exertion. She does use her CPAP on a nightly basis. No recent chest pain. Reports occasional palpitations which typically occur when she takes her CPAP off in the morning hours but this  spontaneously resolves. She remains on Coumadin for anticoagulation with no reports of active bleeding. INR is followed by her PCP.  Studies Reviewed:   EKG: EKG is not ordered today.  Echocardiogram: 01/2020 IMPRESSIONS     1. Left ventricular ejection fraction, by estimation, is 60 to 65%. The  left ventricle has normal function. The left ventricle has no regional  wall motion abnormalities. Left ventricular diastolic parameters are  consistent with Grade I diastolic  dysfunction (impaired relaxation).   2. Right ventricular systolic function is normal. The right ventricular  size is normal.   3. Left atrial size was moderately dilated.   4. The mitral valve is normal in structure. No evidence of mitral valve  regurgitation. No evidence of mitral stenosis.   5. The aortic valve is tricuspid. Aortic valve regurgitation is not  visualized. No aortic stenosis is present.   6. The inferior vena cava is normal in size with greater than 50%  respiratory variability, suggesting right atrial pressure of 3 mmHg.    PPM Placement: 06/2022 CONCLUSIONS:   1. Successful implantation of a Medtronic dual-chamber pacemaker for symptomatic bradycardia due to sinus node dysfunction  2. No early apparent complications.      Physical Exam:   VS:  BP 132/80   Pulse 79   Ht 5' 6.5" (1.689 m)   Wt 277 lb (125.6 kg)   SpO2 99%   BMI 44.04 kg/m    Wt Readings from Last 3 Encounters:  10/02/22 277 lb (125.6 kg)  07/17/22 275 lb (124.7 kg)  07/04/22 274 lb (124.3 kg)     GEN: Well nourished,  well developed female appearing in no acute distress NECK: No JVD; No carotid bruits CARDIAC: RRR, no murmurs, rubs, gallops RESPIRATORY:  Clear to auscultation without rales, wheezing or rhonchi  ABDOMEN: Appears non-distended. No obvious abdominal masses. EXTREMITIES: No clubbing or cyanosis. No pitting edema.  Distal pedal pulses are 2+ bilaterally.   Assessment and Plan:   1. Paroxsymal Atrial  Fibrillation - She reports occasional palpitations but no persistent symptoms. Remains on Flecainide 50 mg twice daily as she was previously intolerant to Toprol-XL due to dizziness and hypotension. - Remains on Coumadin for anticoagulation. CBC in 06/2022 showed hemoglobin was stable at 12.5 with platelets at 198 K.  2. History of PE and DVT's/Factor V Leiden Mutation - She remains on Coumadin for anticoagulation. INR is followed by her PCP. No reports of active bleeding.   3. Symptomatic Bradycardia - She did undergo Medtronic PPM placement in 06/2022. She is scheduled for a remote device check later this month and she has follow-up with Dr. Ladona Ridgel in 10/2022.  4. Aortic Tortuosity - CXR in 06/2022 did show increased tortuosity of her descending thoracic aorta which was new when compared to prior imaging. She is concerned about this given a family history of aneurysms as well. Will arrange for a CTA Aorta for further assessment.  5. OSA - Continued compliance with CPAP encouraged.   Signed, Ellsworth Lennox, PA-C

## 2022-10-02 NOTE — Patient Instructions (Signed)
Medication Instructions:  Your physician recommends that you continue on your current medications as directed. Please refer to the Current Medication list given to you today.  *If you need a refill on your cardiac medications before your next appointment, please call your pharmacy*   Lab Work: Your physician recommends that you return for lab work in: Just before your CTA   If you have labs (blood work) drawn today and your tests are completely normal, you will receive your results only by: MyChart Message (if you have MyChart) OR A paper copy in the mail If you have any lab test that is abnormal or we need to change your treatment, we will call you to review the results.   Testing/Procedures: Non-Cardiac CT Angiography (CTA), is a special type of CT scan that uses a computer to produce multi-dimensional views of major blood vessels throughout the body. In CT angiography, a contrast material is injected through an IV to help visualize the blood vessels    Follow-Up: At [facility], you and your health needs are our priority.  As part of our continuing mission to provide you with exceptional heart care, we have created designated Provider Care Teams.  These Care Teams include your primary Cardiologist (physician) and Advanced Practice Providers (APPs -  Physician Assistants and Nurse Practitioners) who all work together to provide you with the care you need, when you need it.  We recommend signing up for the patient portal called "MyChart".  Sign up information is provided on this After Visit Summary.  MyChart is used to connect with patients for Virtual Visits (Telemedicine).  Patients are able to view lab/test results, encounter notes, upcoming appointments, etc.  Non-urgent messages can be sent to your provider as well.   To learn more about what you can do with MyChart, go to [email or web address].    Your next appointment:   6 month(s)  Provider:   You may see Nona Dell, MD or one of the following Advanced Practice Providers on your designated Care Team:   Randall An, PA-C  Jacolyn Reedy, PA-C     Other Instructions Thank you for choosing [facility]!

## 2022-10-03 ENCOUNTER — Other Ambulatory Visit (HOSPITAL_COMMUNITY)
Admission: RE | Admit: 2022-10-03 | Discharge: 2022-10-03 | Disposition: A | Discharge status: Home / Self Care | Payer: PPO | Source: Ambulatory Visit | Attending: Student | Admitting: Student

## 2022-10-03 DIAGNOSIS — Z79899 Other long term (current) drug therapy: Secondary | ICD-10-CM | POA: Diagnosis not present

## 2022-10-03 DIAGNOSIS — I495 Sick sinus syndrome: Secondary | ICD-10-CM | POA: Insufficient documentation

## 2022-10-03 DIAGNOSIS — I480 Paroxysmal atrial fibrillation: Secondary | ICD-10-CM | POA: Diagnosis not present

## 2022-10-03 DIAGNOSIS — I77819 Aortic ectasia, unspecified site: Secondary | ICD-10-CM | POA: Diagnosis not present

## 2022-10-03 LAB — BASIC METABOLIC PANEL
Anion gap: 11 (ref 5–15)
BUN: 22 mg/dL (ref 8–23)
CO2: 24 mmol/L (ref 22–32)
Calcium: 9.1 mg/dL (ref 8.9–10.3)
Chloride: 105 mmol/L (ref 98–111)
Creatinine, Ser: 0.97 mg/dL (ref 0.44–1.00)
GFR, Estimated: >60 mL/min (ref >60)
Glucose, Bld: 92 mg/dL (ref 70–99)
Potassium: 4.2 mmol/L (ref 3.5–5.1)
Sodium: 140 mmol/L (ref 135–145)

## 2022-10-04 ENCOUNTER — Ambulatory Visit (HOSPITAL_COMMUNITY)
Admission: RE | Admit: 2022-10-04 | Discharge: 2022-10-04 | Disposition: A | Discharge status: Home / Self Care | Payer: PPO | Source: Ambulatory Visit | Attending: Student

## 2022-10-04 DIAGNOSIS — I77819 Aortic ectasia, unspecified site: Secondary | ICD-10-CM | POA: Diagnosis not present

## 2022-10-04 DIAGNOSIS — D3501 Benign neoplasm of right adrenal gland: Secondary | ICD-10-CM | POA: Diagnosis not present

## 2022-10-04 DIAGNOSIS — I2510 Atherosclerotic heart disease of native coronary artery without angina pectoris: Secondary | ICD-10-CM | POA: Diagnosis not present

## 2022-10-04 DIAGNOSIS — E041 Nontoxic single thyroid nodule: Secondary | ICD-10-CM | POA: Diagnosis not present

## 2022-10-04 MED ORDER — IOHEXOL 350 MG/ML SOLN
85.0000 mL | Freq: Once | INTRAVENOUS | Status: AC | PRN
  Administered 2022-10-04: 85 mL via INTRAVENOUS

## 2022-10-07 ENCOUNTER — Telehealth: Payer: Self-pay | Admitting: Cardiology

## 2022-10-07 NOTE — Telephone Encounter (Signed)
Patient is returning call to discuss CT results.

## 2022-10-07 NOTE — Telephone Encounter (Signed)
Patient notified. Pt is questioning how a CT can tell if tumors are cancerous or not. Pt is anxious as she read report and report noted 8mm tumor on her thyroid- pt stated her mother passed away from Thyroid cancer. Please advise.

## 2022-10-07 NOTE — Telephone Encounter (Signed)
-----   Message from Ellsworth Lennox sent at 10/07/2022  2:29 PM EDT ----- Please let the patient know her CT Scan showed no evidence of an aortic aneurysm or dissection which is good news! She did have aortic atherosclerosis and coronary calcifications which were noted on prior imaging and she is already on a statin. Would not start Aspirin given the use of Coumadin. 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 [place] or her PCP could arrange follow-up imaging in 1 year to ensure no changes.

## 2022-10-07 NOTE — Telephone Encounter (Addendum)
   They can tell by looking at the density of the tissues as they show up on the imaging. For instance, solid tumors how up differently from fatty tissue. If they cannot tell, then they recommend additional testing. The Radiology MD that read her study felt these were adenomas and lipomas which are benign. Again, since this is the first time these have been noted, I think it would be worth obtaining follow-up imaging in the future for reassessment to make sure findings remain stable.   I am going to ask the Radiologist to review her report again as it said her thyroid gland demonstrated no significant findings but later said a hypodense nodule in the right lobe of the thyroid gland was compatible with an adenoma and I wonder if they meant her right adrenal as listed in the impression of the report. If it is along her thyroid, then would want to make sure a TSH has been checked by her PCP recently. Will follow-up with her once I hear back from the Radiologist.   Signed, Ellsworth Lennox, PA-C 10/07/2022, 7:20 PM

## 2022-10-08 NOTE — Telephone Encounter (Signed)
Patient notified and verbalized understanding. Pt stated that she has recent TSH drawn with PCP and it was normal.

## 2022-10-15 DIAGNOSIS — Z7901 Long term (current) use of anticoagulants: Secondary | ICD-10-CM | POA: Diagnosis not present

## 2022-10-17 ENCOUNTER — Ambulatory Visit (INDEPENDENT_AMBULATORY_CARE_PROVIDER_SITE_OTHER): Payer: PPO

## 2022-10-17 DIAGNOSIS — I495 Sick sinus syndrome: Secondary | ICD-10-CM | POA: Diagnosis not present

## 2022-10-17 LAB — CUP PACEART REMOTE DEVICE CHECK
Battery Remaining Longevity: 163 mo
Battery Voltage: 3.2 V
Brady Statistic AP VP Percent: 0.19 %
Brady Statistic AP VS Percent: 86.84 %
Brady Statistic AS VP Percent: 0 %
Brady Statistic AS VS Percent: 12.97 %
Brady Statistic RA Percent Paced: 86.53 %
Brady Statistic RV Percent Paced: 0.21 %
Date Time Interrogation Session: 20240926023807
Implantable Lead Connection Status: 753985
Implantable Lead Connection Status: 753985
Implantable Lead Implant Date: 20240626
Implantable Lead Implant Date: 20240626
Implantable Lead Location: 753859
Implantable Lead Location: 753860
Implantable Lead Model: 5076
Implantable Lead Model: 5076
Implantable Pulse Generator Implant Date: 20240626
Lead Channel Impedance Value: 342 Ohm
Lead Channel Impedance Value: 418 Ohm
Lead Channel Impedance Value: 722 Ohm
Lead Channel Impedance Value: 798 Ohm
Lead Channel Pacing Threshold Amplitude: 0.375 V
Lead Channel Pacing Threshold Amplitude: 0.75 V
Lead Channel Pacing Threshold Pulse Width: 0.4 ms
Lead Channel Pacing Threshold Pulse Width: 0.4 ms
Lead Channel Sensing Intrinsic Amplitude: 15.125 mV
Lead Channel Sensing Intrinsic Amplitude: 15.125 mV
Lead Channel Sensing Intrinsic Amplitude: 2.125 mV
Lead Channel Sensing Intrinsic Amplitude: 2.125 mV
Lead Channel Setting Pacing Amplitude: 1.5 V
Lead Channel Setting Pacing Amplitude: 2 V
Lead Channel Setting Pacing Pulse Width: 0.4 ms
Lead Channel Setting Sensing Sensitivity: 1.2 mV
Zone Setting Status: 755011

## 2022-10-25 NOTE — Progress Notes (Signed)
Remote pacemaker transmission.   

## 2022-10-31 ENCOUNTER — Ambulatory Visit: Payer: PPO | Attending: Internal Medicine | Admitting: Internal Medicine

## 2022-10-31 ENCOUNTER — Encounter: Payer: Self-pay | Admitting: Internal Medicine

## 2022-10-31 VITALS — BP 137/83 | HR 60 | Ht 66.5 in | Wt 281.0 lb

## 2022-10-31 DIAGNOSIS — I480 Paroxysmal atrial fibrillation: Secondary | ICD-10-CM

## 2022-10-31 LAB — CUP PACEART INCLINIC DEVICE CHECK
Battery Remaining Longevity: 162 mo
Battery Voltage: 3.2 V
Brady Statistic AP VP Percent: 0.21 %
Brady Statistic AP VS Percent: 87.49 %
Brady Statistic AS VP Percent: 0 %
Brady Statistic AS VS Percent: 12.3 %
Brady Statistic RA Percent Paced: 87.04 %
Brady Statistic RV Percent Paced: 0.23 %
Date Time Interrogation Session: 20241010163347
Implantable Lead Connection Status: 753985
Implantable Lead Connection Status: 753985
Implantable Lead Implant Date: 20240626
Implantable Lead Implant Date: 20240626
Implantable Lead Location: 753859
Implantable Lead Location: 753860
Implantable Lead Model: 5076
Implantable Lead Model: 5076
Implantable Pulse Generator Implant Date: 20240626
Lead Channel Impedance Value: 323 Ohm
Lead Channel Impedance Value: 399 Ohm
Lead Channel Impedance Value: 741 Ohm
Lead Channel Impedance Value: 874 Ohm
Lead Channel Pacing Threshold Amplitude: 0.375 V
Lead Channel Pacing Threshold Amplitude: 0.5 V
Lead Channel Pacing Threshold Amplitude: 0.625 V
Lead Channel Pacing Threshold Amplitude: 0.75 V
Lead Channel Pacing Threshold Pulse Width: 0.4 ms
Lead Channel Pacing Threshold Pulse Width: 0.4 ms
Lead Channel Pacing Threshold Pulse Width: 0.4 ms
Lead Channel Pacing Threshold Pulse Width: 0.4 ms
Lead Channel Sensing Intrinsic Amplitude: 14.125 mV
Lead Channel Sensing Intrinsic Amplitude: 15.75 mV
Lead Channel Sensing Intrinsic Amplitude: 2 mV
Lead Channel Sensing Intrinsic Amplitude: 2.625 mV
Lead Channel Setting Pacing Amplitude: 1.5 V
Lead Channel Setting Pacing Amplitude: 2 V
Lead Channel Setting Pacing Pulse Width: 0.4 ms
Lead Channel Setting Sensing Sensitivity: 1.2 mV
Zone Setting Status: 755011

## 2022-10-31 MED ORDER — FLECAINIDE ACETATE 50 MG PO TABS: 75.0000 mg | ORAL_TABLET | Freq: Two times a day (BID) | ORAL | 3 refills | Status: DC

## 2022-10-31 NOTE — Patient Instructions (Signed)
Medication Instructions:  Your physician has recommended you make the following change in your medication:   -Increase Flecainide to 75 mg twice daily.  *If you need a refill on your cardiac medications before your next appointment, please call your pharmacy*   Lab Work: None  If you have labs (blood work) drawn today and your tests are completely normal, you will receive your results only by: MyChart Message (if you have MyChart) OR A paper copy in the mail If you have any lab test that is abnormal or we need to change your treatment, we will call you to review the results.   Testing/Procedures: None   Follow-Up: At [facility], you and your health needs are our priority.  As part of our continuing mission to provide you with exceptional heart care, we have created designated Provider Care Teams.  These Care Teams include your primary Cardiologist (physician) and Advanced Practice Providers (APPs -  Physician Assistants and Nurse Practitioners) who all work together to provide you with the care you need, when you need it.  We recommend signing up for the patient portal called "MyChart".  Sign up information is provided on this After Visit Summary.  MyChart is used to connect with patients for Virtual Visits (Telemedicine).  Patients are able to view lab/test results, encounter notes, upcoming appointments, etc.  Non-urgent messages can be sent to your provider as well.   To learn more about what you can do with MyChart, go to [email or web address].    Your next appointment:   1 year(s)  Provider:   Lewayne Bunting, MD    Other Instructions Please schedule a 2 week nurse visit for EKG/Labs

## 2022-10-31 NOTE — Progress Notes (Signed)
HPI Ms. 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 She has PAF treated with flecainide. She underwent PPM insertion almost 4 months ago. In the interim, she notes occaisional episodes of palpitations. She was tried on some beta blocker but developed hypotension.  Allergies  Allergen Reactions   Adhesive [Tape] Itching and Rash   Benazepril Hcl Cough     Current Outpatient Medications  Medication Sig Dispense Refill   albuterol (VENTOLIN HFA) 108 (90 Base) MCG/ACT inhaler Inhale 2 puffs into the lungs every 6 (six) hours as needed for wheezing or shortness of breath.     flecainide (TAMBOCOR) 50 MG tablet Take 1 tablet by mouth twice daily 60 tablet 3   furosemide (LASIX) 20 MG tablet Take 40 mg by mouth daily.     hydrOXYzine (ATARAX/VISTARIL) 25 MG tablet Take 25 mg by mouth daily.     oxybutynin (DITROPAN) 5 MG tablet Take 5 mg by mouth 2 (two) times daily.     pravastatin (PRAVACHOL) 80 MG tablet Take 80 mg by mouth at bedtime.      traMADol (ULTRAM) 50 MG tablet Take 50 mg by mouth 3 (three) times daily as needed for severe pain.     triamcinolone ointment (KENALOG) 0.5 % Apply 1 Application topically 2 (two) times daily as needed (irritation).     warfarin (COUMADIN) 5 MG tablet Take 5 mg by mouth every Monday, Wednesday, and Friday.     warfarin (COUMADIN) 7.5 MG tablet Take 7.5 mg by mouth every Tuesday, Thursday, Saturday, and Sunday.     No current facility-administered medications for this visit.     Past Medical History:  Diagnosis Date   Allergic rhinitis    Esophageal reflux    Essential hypertension    Factor V Leiden mutation (HCC)    GERD (gastroesophageal reflux disease)    History of cardiac catheterization    Normal coronaries July 2015   History of DVT (deep vein thrombosis)    History of pulmonary embolism    Multiple, status post IVC filter, on Coumadin   Mixed hyperlipidemia    Obstructive sleep apnea     NPSG 05-29-07; AHI 32.9,cpap 7 to 9   On home oxygen therapy    2 liters at night- usual- no recent changes   Osteoarthritis    Persistent atrial fibrillation [facility])    Documented October 2016   Transfusion history    Last transfusion after umbilical hernia- [facility]    ROS:   All systems reviewed and negative except as noted in the HPI.   Past Surgical History:  Procedure Laterality Date   CHOLECYSTECTOMY     COLONOSCOPY WITH PROPOFOL N/A 06/13/2015   Procedure: COLONOSCOPY WITH PROPOFOL;  Surgeon: Charolett Bumpers, MD;  Location: [facility] ENDOSCOPY;  Service: Endoscopy;  Laterality: N/A;   ENDOMETRIAL ABLATION     GANGLION CYST EXCISION     Hernia abscess     HERNIA REPAIR     umbilical   I&D hernia incisional abscess     IR ABLATE LIVER CRYOABLATION  05/12/2019   IR RADIOLOGIST EVAL & MGMT  05/10/2019   IVC filter Right    LEFT HEART CATHETERIZATION WITH CORONARY ANGIOGRAM N/A 08/20/2013   Procedure: LEFT HEART CATHETERIZATION WITH CORONARY ANGIOGRAM;  Surgeon: Micheline Chapman, MD;  Location: [facility] CATH LAB;  Service: Cardiovascular;  Laterality: N/A;   PACEMAKER IMPLANT N/A 07/17/2022   Procedure: PACEMAKER IMPLANT;  Surgeon: Marinus Maw,  MD;  Location: [facility] INVASIVE CV LAB;  Service: Cardiovascular;  Laterality: N/A;   ROTATOR CUFF REPAIR       Family History  Problem Relation Age of Onset   Cancer Mother        Thyroid cancer   Heart disease Father        Heart attack   Allergies Other    Asthma Other    COPD Other      Social History   Socioeconomic History   Marital status: Widowed    Spouse name: Not on file   Number of children: Not on file   Years of education: Not on file   Highest education level: Not on file  Occupational History   Occupation: Disability  Tobacco Use   Smoking status: Never   Smokeless tobacco: Never   Tobacco comments:    only as a teen for 6 months  Vaping Use   Vaping status: Never Used  Substance and Sexual Activity    Alcohol use: No    Alcohol/week: 0.0 standard drinks of alcohol   Drug use: No   Sexual activity: Not on file  Other Topics Concern   Not on file  Social History Narrative   Widowed-cirrhosis   Social Determinants of Health   Financial Resource Strain: Not on file  Food Insecurity: Not on file  Transportation Needs: Not on file  Physical Activity: Not on file  Stress: Not on file  Social Connections: Not on file  Intimate Partner Violence: Not on file     BP 137/83   Pulse 60   Ht 5' 6.5" (1.689 m)   Wt 281 lb (127.5 kg)   SpO2 97%   BMI 44.68 kg/m   Physical Exam:  Well appearing NAD HEENT: Unremarkable Neck:  No JVD, no thyromegally Lymphatics:  No adenopathy Back:  No CVA tenderness Lungs:  Clear with no wheezes HEART:  Regular rate rhythm, no murmurs, no rubs, no clicks Abd:  soft, positive bowel sounds, no organomegally, no rebound, no guarding Ext:  2 plus pulses, no edema, no cyanosis, no clubbing Skin:  No rashes no nodules Neuro:  CN II through XII intact, motor grossly intact  DEVICE  Normal device function.  See PaceArt for details.   Assess/Plan: PAF - she is maintaining NSR 99% but she goes fast in atrial fib. I have asked her to increase the dose of flecainide to 75 mg bid.  Sinus node dysfunction - she is pacing 87% but her histograms are flat. We increased the rate. She did not tolerate adding rate response a few months ago as her rate response increased when she got up.  Obesity - she will be encouraged to lose weight.  Rhonda Gowda Loie Jahr,MD

## 2022-11-12 DIAGNOSIS — Z7901 Long term (current) use of anticoagulants: Secondary | ICD-10-CM | POA: Diagnosis not present

## 2022-11-14 ENCOUNTER — Other Ambulatory Visit (HOSPITAL_COMMUNITY)
Admission: RE | Admit: 2022-11-14 | Discharge: 2022-11-14 | Disposition: A | Discharge status: Home / Self Care | Payer: PPO | Source: Ambulatory Visit | Attending: Internal Medicine | Admitting: Internal Medicine

## 2022-11-14 ENCOUNTER — Other Ambulatory Visit: Payer: Self-pay

## 2022-11-14 ENCOUNTER — Ambulatory Visit: Payer: PPO | Attending: Cardiology | Admitting: *Deleted

## 2022-11-14 DIAGNOSIS — I480 Paroxysmal atrial fibrillation: Secondary | ICD-10-CM | POA: Diagnosis not present

## 2022-11-14 DIAGNOSIS — Z79899 Other long term (current) drug therapy: Secondary | ICD-10-CM | POA: Diagnosis not present

## 2022-11-26 ENCOUNTER — Encounter: Payer: Self-pay | Admitting: Podiatry

## 2022-11-26 ENCOUNTER — Ambulatory Visit: Payer: PPO | Admitting: Podiatry

## 2022-11-26 DIAGNOSIS — D689 Coagulation defect, unspecified: Secondary | ICD-10-CM

## 2022-11-26 DIAGNOSIS — D2371 Other benign neoplasm of skin of right lower limb, including hip: Secondary | ICD-10-CM | POA: Diagnosis not present

## 2022-11-26 DIAGNOSIS — M79676 Pain in unspecified toe(s): Secondary | ICD-10-CM | POA: Diagnosis not present

## 2022-11-26 DIAGNOSIS — D2372 Other benign neoplasm of skin of left lower limb, including hip: Secondary | ICD-10-CM

## 2022-11-26 DIAGNOSIS — B351 Tinea unguium: Secondary | ICD-10-CM

## 2022-11-26 LAB — FLECAINIDE LEVEL: Flecainide: 0.37 ug/mL (ref 0.20–1.00)

## 2022-11-27 ENCOUNTER — Encounter: Payer: Self-pay | Admitting: Orthopaedic Surgery

## 2022-11-27 ENCOUNTER — Ambulatory Visit: Payer: PPO | Admitting: Orthopaedic Surgery

## 2022-11-27 VITALS — Wt 285.0 lb

## 2022-11-27 DIAGNOSIS — M25561 Pain in right knee: Secondary | ICD-10-CM

## 2022-11-27 DIAGNOSIS — G8929 Other chronic pain: Secondary | ICD-10-CM | POA: Diagnosis not present

## 2022-11-27 DIAGNOSIS — M25562 Pain in left knee: Secondary | ICD-10-CM

## 2022-11-27 MED ORDER — METHYLPREDNISOLONE ACETATE 40 MG/ML IJ SUSP
40.0000 mg | INTRAMUSCULAR | Status: AC | PRN
Start: 2022-11-27 — End: 2022-11-27
  Administered 2022-11-27: 40 mg via INTRA_ARTICULAR

## 2022-11-27 MED ORDER — LIDOCAINE HCL 1 % IJ SOLN
3.0000 mL | INTRAMUSCULAR | Status: AC | PRN
Start: 2022-11-27 — End: 2022-11-27
  Administered 2022-11-27: 3 mL

## 2022-11-27 NOTE — Progress Notes (Signed)
She presents today chief complaint of painful elongated toenails and calluses.  Objective: Vital signs are stable alert oriented x 3.  Pulses are palpable.  There is no erythema edema cellulitis drainage or odor.  Toenails are long thick yellow dystrophic clinically mycotic.  No open lesions or wounds.  Benign lesions plantar aspect of the forefoot.  Assessment: Pain limb secondary to onychomycosis benign skin lesions.  Plan: Debrided all benign skin lesions without iatrogenic incident.  Debrided toenails 1 through 5 bilateral.

## 2022-11-27 NOTE — Progress Notes (Signed)
The patient is a 72 year old female we have seen plenty of times before.  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  She is someone who is morbidly obese as well.  She has significant comorbidities that preclude her from having any type of surgery given her high risk.  She comes in about every 3 months for steroid injections in both her knees.  It has been just over 3 months since we have injected her knees and she would like a steroid injection in both knees today.  Her BMI today in the office is just over 45.  She states that steroid injections help still somewhat.  She does have a pacemaker now.  She is on chronic blood thinning medications.  Examination of her knees does not show a large soft tissue envelope around either knee.  Both knees are stable in terms of their clinical exam and no ligamentous instability but they are painful throughout the arc of motion with known severe arthritis.  I did place a steroid injection in both knees today.  We can see her back in 3 months to repeat these injections.    Procedure Note  Patient: Rhonda Acevedo             Date of Birth: 09-Dec-1950           MRN: [id number]             Visit Date: 11/27/2022  Procedures: Visit Diagnoses:  1. Chronic pain of left knee   2. Chronic pain of right knee   3. Severe obesity (BMI >= 40) (HCC)     Large Joint Inj: R knee on 11/27/2022 1:10 PM Indications: diagnostic evaluation and pain Details: 22 G 1.5 in needle, superolateral approach  Arthrogram: No  Medications: 3 mL lidocaine 1 %; 40 mg methylPREDNISolone acetate 40 MG/ML Outcome: tolerated well, no immediate complications Procedure, treatment alternatives, risks and benefits explained, specific risks discussed. Consent was given by the patient. Immediately prior to procedure a time out was called to verify the correct patient, procedure, equipment, support staff and site/side marked as required. Patient was prepped and draped in the usual sterile  fashion.    Large Joint Inj: L knee on 11/27/2022 1:11 PM Indications: diagnostic evaluation and pain Details: 22 G 1.5 in needle, superolateral approach  Arthrogram: No  Medications: 3 mL lidocaine 1 %; 40 mg methylPREDNISolone acetate 40 MG/ML Outcome: tolerated well, no immediate complications Procedure, treatment alternatives, risks and benefits explained, specific risks discussed. Consent was given by the patient. Immediately prior to procedure a time out was called to verify the correct patient, procedure, equipment, support staff and site/side marked as required. Patient was prepped and draped in the usual sterile fashion.

## 2022-12-09 DIAGNOSIS — Z7901 Long term (current) use of anticoagulants: Secondary | ICD-10-CM | POA: Diagnosis not present

## 2022-12-24 DIAGNOSIS — G4733 Obstructive sleep apnea (adult) (pediatric): Secondary | ICD-10-CM | POA: Diagnosis not present

## 2022-12-24 DIAGNOSIS — Z86711 Personal history of pulmonary embolism: Secondary | ICD-10-CM | POA: Diagnosis not present

## 2022-12-24 DIAGNOSIS — D6869 Other thrombophilia: Secondary | ICD-10-CM | POA: Diagnosis not present

## 2022-12-24 DIAGNOSIS — I10 Essential (primary) hypertension: Secondary | ICD-10-CM | POA: Diagnosis not present

## 2022-12-24 DIAGNOSIS — Z950 Presence of cardiac pacemaker: Secondary | ICD-10-CM | POA: Diagnosis not present

## 2022-12-24 DIAGNOSIS — J4520 Mild intermittent asthma, uncomplicated: Secondary | ICD-10-CM | POA: Diagnosis not present

## 2022-12-24 DIAGNOSIS — Z7901 Long term (current) use of anticoagulants: Secondary | ICD-10-CM | POA: Diagnosis not present

## 2022-12-24 DIAGNOSIS — I4891 Unspecified atrial fibrillation: Secondary | ICD-10-CM | POA: Diagnosis not present

## 2022-12-24 DIAGNOSIS — I5032 Chronic diastolic (congestive) heart failure: Secondary | ICD-10-CM | POA: Diagnosis not present

## 2022-12-24 DIAGNOSIS — R7303 Prediabetes: Secondary | ICD-10-CM | POA: Diagnosis not present

## 2022-12-24 DIAGNOSIS — M179 Osteoarthritis of knee, unspecified: Secondary | ICD-10-CM | POA: Diagnosis not present

## 2023-01-16 ENCOUNTER — Ambulatory Visit (INDEPENDENT_AMBULATORY_CARE_PROVIDER_SITE_OTHER): Payer: PPO

## 2023-01-16 DIAGNOSIS — I495 Sick sinus syndrome: Secondary | ICD-10-CM

## 2023-01-16 LAB — CUP PACEART REMOTE DEVICE CHECK
Battery Remaining Longevity: 157 mo
Battery Voltage: 3.17 V
Brady Statistic AP VP Percent: 0.1 %
Brady Statistic AP VS Percent: 89.45 %
Brady Statistic AS VP Percent: 0 %
Brady Statistic AS VS Percent: 10.45 %
Brady Statistic RA Percent Paced: 89.63 %
Brady Statistic RV Percent Paced: 0.1 %
Date Time Interrogation Session: 20241226051610
Implantable Lead Connection Status: 753985
Implantable Lead Connection Status: 753985
Implantable Lead Implant Date: 20240626
Implantable Lead Implant Date: 20240626
Implantable Lead Location: 753859
Implantable Lead Location: 753860
Implantable Lead Model: 5076
Implantable Lead Model: 5076
Implantable Pulse Generator Implant Date: 20240626
Lead Channel Impedance Value: 323 Ohm
Lead Channel Impedance Value: 361 Ohm
Lead Channel Impedance Value: 627 Ohm
Lead Channel Impedance Value: 760 Ohm
Lead Channel Pacing Threshold Amplitude: 0.375 V
Lead Channel Pacing Threshold Amplitude: 0.625 V
Lead Channel Pacing Threshold Pulse Width: 0.4 ms
Lead Channel Pacing Threshold Pulse Width: 0.4 ms
Lead Channel Sensing Intrinsic Amplitude: 13.25 mV
Lead Channel Sensing Intrinsic Amplitude: 13.25 mV
Lead Channel Sensing Intrinsic Amplitude: 2.625 mV
Lead Channel Sensing Intrinsic Amplitude: 2.625 mV
Lead Channel Setting Pacing Amplitude: 1.5 V
Lead Channel Setting Pacing Amplitude: 2 V
Lead Channel Setting Pacing Pulse Width: 0.4 ms
Lead Channel Setting Sensing Sensitivity: 1.2 mV
Zone Setting Status: 755011

## 2023-02-11 ENCOUNTER — Telehealth: Payer: Self-pay | Admitting: Physician Assistant

## 2023-02-11 ENCOUNTER — Telehealth: Payer: Self-pay | Admitting: Internal Medicine

## 2023-02-11 NOTE — Telephone Encounter (Signed)
Patient called back again saying BP dropped. She did get up to go to bathroom and when asked to recheck she says her BP is systolic . Prior to that she said it was 69/high 40s. No syncope. She could not characterize how she felt other than lightheaded (even when her BP is 120 - normal). She uses a cuff that measures her BP on her wrist. I did recommend if she feels poor and would like to be evaluated she should come to ED so we can better assess her. I recommended she monitor her BP and if it is persistently low she should also come to ED to be evaluated.

## 2023-02-11 NOTE — Telephone Encounter (Signed)
She called stating she has been in Afib all day. She has felt unwell and noted a SBP in the 70s. She has been compliant on 75 mg flecainide BID. She has been monitoring her HR with a pulse ox and may have just converted. She rechecked her BP while on the phone and it was 123/66, HR 71. I suspect she converted. I told her to take flecainide as scheduled and call EMS. I instructed to to not drive is her BP drops again.  Roe Rutherford Shanette Tamargo, PA-C 02/11/2023, 6:10 PM [phone]

## 2023-02-12 ENCOUNTER — Encounter: Payer: Self-pay | Admitting: Cardiology

## 2023-02-17 ENCOUNTER — Telehealth: Payer: Self-pay | Admitting: Cardiology

## 2023-02-17 NOTE — Telephone Encounter (Signed)
.  SYNCOPECHMG   Pt c/o Syncope: STAT if syncope occurred within 24 hours and pt complains of lightheadedness.   High Priority if episode of passing out, completely, today or in last 24 hours   1. Did you pass out today? No, felt like she was going to pas out-    2. When is the last time you passed out?  Have not passed out    3. Has this occurred multiple times? Yes, been going on for over a week  4. Did you have any symptoms prior to passing out?  She says she feels lightheaded   5. Did you fall? If so, are you on a blood thinner? No- patient wants to be seen by somebody please

## 2023-02-17 NOTE — Telephone Encounter (Signed)
Pt spoke with B. Strader via MyChart and is current awaiting an appt.

## 2023-02-18 ENCOUNTER — Telehealth: Payer: Self-pay

## 2023-02-18 NOTE — Telephone Encounter (Signed)
I helped the pt send a manual transmission. Transmission received 02/18/2023. I told the pt the nurse will give her a call back.

## 2023-02-18 NOTE — Telephone Encounter (Signed)
Remote transmission received and reviewed. On presenting rhythm, pt in NSR. Do note increase in AF burden with poor ventricular rate control.   Patient reports intermittent lightheadedness since the middle of January. Denies symptoms with position change but rather random times. Pt reports BP today was 127/67, although has been as low as 60/38 over the past week. Patient advised recommend AF clinic referral to discuss AF w/ RVR and will also forward to Dr. Ladona Ridgel to review as well. Complaint with medications on file including coumadin/flexainide with no missed doses.  Pt given ED precautions if worsening symptoms or syncope event occurs. Advised to then call 911, do not drive. Pt voiced understanding.

## 2023-02-18 NOTE — Telephone Encounter (Signed)
-----   Message from Ellsworth Lennox sent at 02/17/2023  4:31 PM EST ----- Regarding: Remote Check? Good afternoon,   Can we arrange for this patient to have a remote check? She has been having presyncopal episodes so want to make sure no issues with her PPM.   Thanks,  [place]

## 2023-02-18 NOTE — Telephone Encounter (Signed)
Offered pt opening w/Rhonda Acevedo at 10 am this morning. Pt declined.

## 2023-02-18 NOTE — Telephone Encounter (Signed)
Attempted to call patient to send manual transmission. No answer, LMTCB. ?

## 2023-02-18 NOTE — Telephone Encounter (Signed)
    Thank you for the update and arranging for her remote check. She has not been on a beta-blocker due to hypotension in the past and I recently stopped her diuretic due to hypotension. Has been on Flecainide and if having a lot of breakthrough episodes, will either need dose adjustment or consideration of a different antiarrhythmic and will defer to Dr. Ladona Ridgel. She does have follow-up with Dr. Diona Browner scheduled for later this week as well to further review her hypotension.   Signed, Ellsworth Lennox, PA-C 02/18/2023, 11:39 AM

## 2023-02-19 NOTE — Telephone Encounter (Signed)
The pt called stating she spoke with Leigh yesterday. She states she remembered that she did missed a blood thinner. I told her I will send a phone note for the nurse.

## 2023-02-20 NOTE — Telephone Encounter (Signed)
Patient called back to advise she did miss her OAC dose jan 7, 8, 9th d/t tooth extraction.

## 2023-02-20 NOTE — Telephone Encounter (Signed)
Spoke to [place], [place] in AF clinic who advised since patient has apt with Dr. Diona Browner tomorrow 02/21/23,

## 2023-02-21 ENCOUNTER — Ambulatory Visit: Payer: PPO | Attending: Cardiology | Admitting: Cardiology

## 2023-02-21 ENCOUNTER — Encounter: Payer: Self-pay | Admitting: Cardiology

## 2023-02-21 VITALS — BP 142/86 | HR 70 | Ht 66.5 in | Wt 279.8 lb

## 2023-02-21 DIAGNOSIS — I480 Paroxysmal atrial fibrillation: Secondary | ICD-10-CM | POA: Diagnosis not present

## 2023-02-21 DIAGNOSIS — Z950 Presence of cardiac pacemaker: Secondary | ICD-10-CM | POA: Diagnosis not present

## 2023-02-21 DIAGNOSIS — I495 Sick sinus syndrome: Secondary | ICD-10-CM

## 2023-02-21 MED ORDER — MIDODRINE HCL 2.5 MG PO TABS: 2.5000 mg | ORAL_TABLET | Freq: Two times a day (BID) | ORAL | 3 refills | Status: DC

## 2023-02-21 MED ORDER — FLECAINIDE ACETATE 100 MG PO TABS: 100.0000 mg | ORAL_TABLET | Freq: Two times a day (BID) | ORAL | 3 refills | Status: DC

## 2023-02-21 NOTE — Patient Instructions (Addendum)
Medication Instructions:  Your physician has recommended you make the following change in your medication:  Increase Flecainide to 100 mg twice daily Start taking Midodrine 2.5 mg twice daily Continue taking all other medications as prescribed  Labwork: None  Testing/Procedures: None  Follow-Up: Your physician recommends that you schedule a follow-up appointment in: Follow up 2-3 months  Any Other Special Instructions Will Be Listed Below (If Applicable). Thank you for choosing [facility]!     If you need a refill on your cardiac medications before your next appointment, please call your pharmacy.

## 2023-02-21 NOTE — Progress Notes (Signed)
Cardiology Office Note  Date: 02/21/2023   ID: Porfiria, Heinrich 06/11/1950, MRN [id number]  History of Present Illness: Rhonda Acevedo is a 73 y.o. female last seen in September 2024 by Ms. Strader PA-C, I reviewed the note.  She also had interval follow-up with Dr. Ladona Ridgel.  She is here today with her daughter for a follow-up visit.  Main concern is that she has had recurring episodes of dizziness and relative hypotension, also increasing bouts of atrial fibrillation - these often correlate.  I reviewed home blood pressure checks, in general she tends to run a low normal blood pressure at baseline anyway.  She did not tolerate beta-blocker which was discontinued and more recently taken off Lasix as well.  At this point she is on no antihypertensive therapy.  Does not sound like her hypotension is orthostatic, in fact she has had low measurements when being seated.  She has not had frank syncope.  Medtronic pacemaker in place with followed by Dr. Ladona Ridgel.  Device check in December 2024 indicated normal function.  She had a follow-up interrogation just recently showing increase in AF burden with RVR.  I reviewed her medications.  Current regimen includes flecainide, Pravachol, and Coumadin.  Flecainide had been increased to 75 mg twice daily by Dr. Ladona Ridgel as of October 2024.  Physical Exam: VS:  BP (!) 142/86   Pulse 70   Ht 5' 6.5" (1.689 m)   Wt 279 lb 12.8 oz (126.9 kg)   SpO2 99%   BMI 44.48 kg/m , BMI Body mass index is 44.48 kg/m.  Wt Readings from Last 3 Encounters:  02/21/23 279 lb 12.8 oz (126.9 kg)  11/27/22 285 lb (129.3 kg)  10/31/22 281 lb (127.5 kg)    General: Patient appears comfortable at rest. HEENT: Conjunctiva and lids normal. Neck: Supple, no elevated JVP or carotid bruits. Lungs: Clear to auscultation, nonlabored breathing at rest. Cardiac: Regular rate and rhythm, no S3, 1/6 systolic murmur, no pericardial rub.  ECG:  An ECG dated 11/14/2022 was  personally reviewed today and demonstrated:  Atrial paced rhythm.  Labwork: 07/04/2022: Hemoglobin 12.5; Platelets 198 10/03/2022: BUN 22; Creatinine, Ser 0.97; Potassium 4.2; Sodium 140   Other Studies Reviewed Today:  No interval testing for review today.  Assessment and Plan:  1.  Paroxysmal atrial fibrillation with CHA2DS2-VASc score of 3.  Remains intermittently symptomatic with increasing rhythm burden based on recent device check.  Does seem to be some correlation with hypotension when she is in atrial fibrillation with RVR, although not exclusively.  Also runs a low normal blood pressure at baseline despite not being on any further antihypertensive therapy or diuretics.  Plan at this time is to increase flecainide to 100 mg twice daily to hopefully provide better rhythm suppression.  She continues on Coumadin for stroke prophylaxis and follows this with her PCP.  I am going to add midodrine 2.5 mg twice daily as well to hopefully blunt hypotensive response.  Bigger question will be whether she needs a change in antiarrhythmic therapy or even consideration of an AV node ablation if symptomatic arrhythmia persists.  Scheduling follow-up with Dr. Ladona Ridgel.   2.  Sinus node dysfunction postplacement of Medtronic dual-chamber pacemaker by Dr. Ladona Ridgel in June 2024.   3.  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  She continues on lifelong anticoagulation.  Disposition:  Follow up  2 to 3 months.  Signed, Remi Deter  Franky Macho, M.D., F.A.C.C. [facility] at [facility]

## 2023-02-25 ENCOUNTER — Encounter: Payer: Self-pay | Admitting: Internal Medicine

## 2023-02-25 NOTE — Progress Notes (Signed)
 Remote pacemaker transmission.

## 2023-02-27 ENCOUNTER — Encounter: Payer: Self-pay | Admitting: Orthopaedic Surgery

## 2023-02-27 ENCOUNTER — Ambulatory Visit: Payer: PPO | Admitting: Orthopaedic Surgery

## 2023-02-27 ENCOUNTER — Ambulatory Visit: Payer: PPO | Admitting: Podiatry

## 2023-02-27 VITALS — Ht 66.5 in | Wt 279.8 lb

## 2023-02-27 DIAGNOSIS — M25562 Pain in left knee: Secondary | ICD-10-CM | POA: Diagnosis not present

## 2023-02-27 DIAGNOSIS — M25561 Pain in right knee: Secondary | ICD-10-CM | POA: Diagnosis not present

## 2023-02-27 DIAGNOSIS — M79676 Pain in unspecified toe(s): Secondary | ICD-10-CM

## 2023-02-27 DIAGNOSIS — D689 Coagulation defect, unspecified: Secondary | ICD-10-CM | POA: Diagnosis not present

## 2023-02-27 DIAGNOSIS — G8929 Other chronic pain: Secondary | ICD-10-CM

## 2023-02-27 DIAGNOSIS — D2372 Other benign neoplasm of skin of left lower limb, including hip: Secondary | ICD-10-CM | POA: Diagnosis not present

## 2023-02-27 DIAGNOSIS — B351 Tinea unguium: Secondary | ICD-10-CM | POA: Diagnosis not present

## 2023-02-27 DIAGNOSIS — D2371 Other benign neoplasm of skin of right lower limb, including hip: Secondary | ICD-10-CM | POA: Diagnosis not present

## 2023-02-27 MED ORDER — METHYLPREDNISOLONE ACETATE 40 MG/ML IJ SUSP
40.0000 mg | INTRAMUSCULAR | Status: AC | PRN
Start: 2023-02-27 — End: 2023-02-27
  Administered 2023-02-27: 40 mg via INTRA_ARTICULAR

## 2023-02-27 MED ORDER — LIDOCAINE HCL 1 % IJ SOLN
3.0000 mL | INTRAMUSCULAR | Status: AC | PRN
Start: 2023-02-27 — End: 2023-02-27
  Administered 2023-02-27: 3 mL

## 2023-02-27 NOTE — Progress Notes (Signed)
 Patient is well-known to me.  She is 73 years old and has significant arthritis and pain with both of her knees.  She has a lot of comorbidities and she is not a surgical candidate.  Her BMI continues to be high and today it is 44.48.  She is on blood thinning medication as well.  She does have a pacemaker.  She says the steroid injections last for about 6 weeks or so.  She would still like to have them in both knees today.  She is not a diabetic.  She has had no acute change in her medical status.  We last injected her knees 3 months ago with steroid.  Examination of both knees today show varus malalignment but no significant effusion.  Both knees have a painful arc of motion.  I did place a steroid injection in both knees today per her request and she tolerated them well.  She knows to wait at least 3 months before the neck steroid injections.  She would like to do this on a regular basis.     Procedure Note  Patient: Rhonda Acevedo             Date of Birth: 1950-09-03           MRN: [id number]             Visit Date: 02/27/2023  Procedures: Visit Diagnoses:  1. Chronic pain of left knee   2. Chronic pain of right knee     Large Joint Inj: R knee on 02/27/2023 1:50 PM Indications: diagnostic evaluation and pain Details: 22 G 1.5 in needle, superolateral approach  Arthrogram: No  Medications: 3 mL lidocaine  1 %; 40 mg methylPREDNISolone  acetate 40 MG/ML Outcome: tolerated well, no immediate complications Procedure, treatment alternatives, risks and benefits explained, specific risks discussed. Consent was given by the patient. Immediately prior to procedure a time out was called to verify the correct patient, procedure, equipment, support staff and site/side marked as required. Patient was prepped and draped in the usual sterile fashion.    Large Joint Inj: L knee on 02/27/2023 1:51 PM Indications: diagnostic evaluation and pain Details: 22 G 1.5 in needle, superolateral  approach  Arthrogram: No  Medications: 3 mL lidocaine  1 %; 40 mg methylPREDNISolone  acetate 40 MG/ML Outcome: tolerated well, no immediate complications Procedure, treatment alternatives, risks and benefits explained, specific risks discussed. Consent was given by the patient. Immediately prior to procedure a time out was called to verify the correct patient, procedure, equipment, support staff and site/side marked as required. Patient was prepped and draped in the usual sterile fashion.

## 2023-02-27 NOTE — Progress Notes (Signed)
 She presents today chief complaint of painful elongated toenails.  Objective: Toenails are long thick yellow dystrophic clinically mycotic multiple benign skin lesions plantar aspect of the bilateral foot with strong palpable pulses.  Assessment: Currently on long-term blood thinner painful mycotic nails and painful benign skin lesions.  Plan: Debrided benign skin lesions debrided nails 1 through 5 bilateral.  Follow-up with her in 3 months

## 2023-04-09 DIAGNOSIS — Z7901 Long term (current) use of anticoagulants: Secondary | ICD-10-CM | POA: Diagnosis not present

## 2023-04-15 ENCOUNTER — Ambulatory Visit: Payer: PPO | Admitting: Internal Medicine

## 2023-04-17 ENCOUNTER — Ambulatory Visit (INDEPENDENT_AMBULATORY_CARE_PROVIDER_SITE_OTHER): Payer: PPO

## 2023-04-17 DIAGNOSIS — I495 Sick sinus syndrome: Secondary | ICD-10-CM

## 2023-04-17 LAB — CUP PACEART REMOTE DEVICE CHECK
Battery Remaining Longevity: 154 mo
Battery Voltage: 3.13 V
Brady Statistic AP VP Percent: 0.15 %
Brady Statistic AP VS Percent: 89.46 %
Brady Statistic AS VP Percent: 0 %
Brady Statistic AS VS Percent: 10.38 %
Brady Statistic RA Percent Paced: 89.58 %
Brady Statistic RV Percent Paced: 0.16 %
Date Time Interrogation Session: 20250327021055
Implantable Lead Connection Status: 753985
Implantable Lead Connection Status: 753985
Implantable Lead Implant Date: 20240626
Implantable Lead Implant Date: 20240626
Implantable Lead Location: 753859
Implantable Lead Location: 753860
Implantable Lead Model: 5076
Implantable Lead Model: 5076
Implantable Pulse Generator Implant Date: 20240626
Lead Channel Impedance Value: 361 Ohm
Lead Channel Impedance Value: 380 Ohm
Lead Channel Impedance Value: 437 Ohm
Lead Channel Impedance Value: 494 Ohm
Lead Channel Pacing Threshold Amplitude: 0.5 V
Lead Channel Pacing Threshold Amplitude: 1.25 V
Lead Channel Pacing Threshold Pulse Width: 0.4 ms
Lead Channel Pacing Threshold Pulse Width: 0.4 ms
Lead Channel Sensing Intrinsic Amplitude: 14.875 mV
Lead Channel Sensing Intrinsic Amplitude: 14.875 mV
Lead Channel Sensing Intrinsic Amplitude: 2.5 mV
Lead Channel Sensing Intrinsic Amplitude: 2.5 mV
Lead Channel Setting Pacing Amplitude: 1.5 V
Lead Channel Setting Pacing Amplitude: 2.5 V
Lead Channel Setting Pacing Pulse Width: 0.4 ms
Lead Channel Setting Sensing Sensitivity: 1.2 mV
Zone Setting Status: 755011

## 2023-04-25 ENCOUNTER — Encounter: Payer: Self-pay | Admitting: Internal Medicine

## 2023-05-02 ENCOUNTER — Encounter: Payer: Self-pay | Admitting: Student

## 2023-05-02 ENCOUNTER — Ambulatory Visit: Payer: PPO | Attending: Student | Admitting: Student

## 2023-05-02 VITALS — BP 120/60 | HR 69 | Ht 66.5 in | Wt 265.8 lb

## 2023-05-02 DIAGNOSIS — I480 Paroxysmal atrial fibrillation: Secondary | ICD-10-CM | POA: Diagnosis not present

## 2023-05-02 DIAGNOSIS — I959 Hypotension, unspecified: Secondary | ICD-10-CM

## 2023-05-02 DIAGNOSIS — I495 Sick sinus syndrome: Secondary | ICD-10-CM

## 2023-05-02 DIAGNOSIS — D6851 Activated protein C resistance: Secondary | ICD-10-CM

## 2023-05-02 NOTE — Patient Instructions (Signed)
 Medication Instructions:  Your physician recommends that you continue on your current medications as directed. Please refer to the Current Medication list given to you today.  *If you need a refill on your cardiac medications before your next appointment, please call your pharmacy*  Lab Work: NONE   If you have labs (blood work) drawn today and your tests are completely normal, you will receive your results only by: MyChart Message (if you have MyChart) OR A paper copy in the mail If you have any lab test that is abnormal or we need to change your treatment, we will call you to review the results.  Testing/Procedures: NONE   Follow-Up: At [facility], you and your health needs are our priority.  As part of our continuing mission to provide you with exceptional heart care, our providers are all part of one team.  This team includes your primary Cardiologist (physician) and Advanced Practice Providers or APPs (Physician Assistants and Nurse Practitioners) who all work together to provide you with the care you need, when you need it.  Your next appointment:   6 month(s)  Provider:   You may see Nona Dell, MD or one of the following Advanced Practice Providers on your designated Care Team:   Randall An, PA-C  [place], [place] Jacolyn Reedy, [place]     We recommend signing up for the patient portal called "MyChart".  Sign up information is provided on this After Visit Summary.  MyChart is used to connect with patients for Virtual Visits (Telemedicine).  Patients are able to view lab/test results, encounter notes, upcoming appointments, etc.  Non-urgent messages can be sent to your provider as well.   To learn more about what you can do with MyChart, go to [email or web address].   Other Instructions Thank you for choosing [facility]!

## 2023-05-02 NOTE — Progress Notes (Signed)
 Cardiology Office Note    Date:  05/02/2023  ID:  BRYER GOTTSCH, DOB 04/23/1950, MRN [id number] Cardiologist: Nona Dell, MD    History of Present Illness:    Rhonda Acevedo is a 73 y.o. female with past medical history of paroxysmal atrial fibrillation, sinus node dysfunction (s/p Medtronic dual-chamber PPM in 06/2022), prior chest pain (prior cardiac catheterization 2015 showing normal coronary arteries), history of PE/DVT's, Factor V Leiden mutation, HLD and OSA who presents to the office today for 67-month follow-up.  She was last examined by Dr. Diona Browner in 01/2023 and had been experiencing episodes of dizziness with associated hypotension and also worsening episodes of atrial fibrillation. She previously did not tolerate beta-blocker therapy and was taking Flecainide. Recent device interrogation had shown an increase in her atrial fibrillation burden. She had been on Flecainide 75 mg twice daily and this was titrated to 100 mg twice daily to see if this would help with rhythm suppression. Was continued on Coumadin for anticoagulation. She was started on Midodrine 2.5 mg twice daily to see if this would help with her hypotension. It was recommended that if she continued to have increased episodes of atrial fibrillation, would need to be evaluated by EP again for consideration of alternative antiarrhythmics or consideration of AV node ablation.  Her device interrogation in 03/2023 showed normal device function and she did have 5 episodes of AT/AF since 01/2023 and the longest episode lasted for 50 minutes.   In talking with the patient today, she reports still having intermittent palpitations which resemble her known atrial fibrillation but these have overall improved since Flecainide was titrated the time of her last office visit. Still has intermittent dizziness but says that she has not been taking Midodrine 2.5 mg twice daily as she felt like this was causing her blood pressure to  increase too much. She instead takes 2.5 mg a few days a week. If she has dizziness with associated hypotension during the day, she will take an extra dose. Estimates that she consumes between 75 to 100 ounces of water a day. No recent dyspnea on exertion, orthopnea, PND or pitting edema. Remains on Coumadin for anticoagulation with no recent melena, hematochezia or hematuria.  Studies Reviewed:   EKG: EKG is ordered today and demonstrates:   EKG Interpretation Date/Time:  Friday May 02 2023 13:10:32 EDT Ventricular Rate:  70 PR Interval:  206 QRS Duration:  104 QT Interval:  402 QTC Calculation: 434 R Axis:   9  Text Interpretation: Atrial-paced rhythm No diagnostic ST changes. No significant change was found Confirmed by Randall An ([id number]) on 05/02/2023 1:12:40 PM       Echocardiogram: 01/2020 IMPRESSIONS     1. Left ventricular ejection fraction, by estimation, is 60 to 65%. The  left ventricle has normal function. The left ventricle has no regional  wall motion abnormalities. Left ventricular diastolic parameters are  consistent with Grade I diastolic  dysfunction (impaired relaxation).   2. Right ventricular systolic function is normal. The right ventricular  size is normal.   3. Left atrial size was moderately dilated.   4. The mitral valve is normal in structure. No evidence of mitral valve  regurgitation. No evidence of mitral stenosis.   5. The aortic valve is tricuspid. Aortic valve regurgitation is not  visualized. No aortic stenosis is present.   6. The inferior vena cava is normal in size with greater than 50%  respiratory variability, suggesting right atrial pressure of 3 mmHg.  Risk Assessment/Calculations:    CHA2DS2-VASc Score = 3   This indicates a 3.2% annual risk of stroke. The patient's score is based upon: CHF History: 0 HTN History: 0 Diabetes History: 0 Stroke History: 0 Vascular Disease History: 1 Age Score: 1 Gender Score: 1     Physical Exam:   VS:  BP 120/60 (BP Location: Left Wrist, Cuff Size: Normal)   Pulse 69   Ht 5' 6.5" (1.689 m)   Wt 265 lb 12.8 oz (120.6 kg)   SpO2 97%   BMI 42.26 kg/m    Wt Readings from Last 3 Encounters:  05/02/23 265 lb 12.8 oz (120.6 kg)  02/27/23 279 lb 12.8 oz (126.9 kg)  02/21/23 279 lb 12.8 oz (126.9 kg)     GEN: Pleasant female appearing in no acute distress NECK: No JVD; No carotid bruits CARDIAC: RRR, no murmurs, rubs, gallops RESPIRATORY:  Clear to auscultation without rales, wheezing or rhonchi  ABDOMEN: Appears non-distended. No obvious abdominal masses. EXTREMITIES: No clubbing or cyanosis. No pitting edema.  Distal pedal pulses are 2+ bilaterally.   Assessment and Plan:   1. Paroxysmal Atrial Fibrillation/Use of Long-term Anticoagulation - Reports her palpitations have decreased in frequency and severity since dose adjustment of Flecainide. She is in atrial-paced rhythm by EKG today. Continue Flecainide 100 mg twice daily for rhythm control. She should ideally be on a beta-blocker with this but was previously intolerant to beta-blocker therapy due to dizziness and hypotension and has been continued on Flecainide alone per EP. Has scheduled follow-up with Dr. Ladona Ridgel next month.  - She is on Coumadin for anticoagulation which is followed by her PCP.  2. Sinus Node Dysfunction - Previously underwent Medtronic PPM placement in 06/2022 and her device is followed by Dr. Ladona Ridgel. Was functioning normally by recent interrogation in 03/2023.  3. History of PE/DVT and Factor V Leiden Mutation - She remains on Coumadin for anticoagulation which is followed by her PCP. No reports of active bleeding.  4. Hypotension - BP is well-controlled at 120/60 during today's visit. She has been taking Midodrine as needed as outlined above and we reviewed that she could take 2.5 mg up to TID if needed given the short half-life of the medication.  Signed, Ellsworth Lennox, PA-C

## 2023-05-05 DIAGNOSIS — Z7901 Long term (current) use of anticoagulants: Secondary | ICD-10-CM | POA: Diagnosis not present

## 2023-05-20 DIAGNOSIS — Z7901 Long term (current) use of anticoagulants: Secondary | ICD-10-CM | POA: Diagnosis not present

## 2023-05-28 NOTE — Progress Notes (Signed)
 Remote pacemaker transmission.

## 2023-05-29 ENCOUNTER — Ambulatory Visit: Payer: PPO | Admitting: Podiatry

## 2023-05-29 ENCOUNTER — Encounter: Payer: Self-pay | Admitting: Podiatry

## 2023-05-29 ENCOUNTER — Encounter: Payer: Self-pay | Admitting: Orthopaedic Surgery

## 2023-05-29 ENCOUNTER — Ambulatory Visit: Payer: PPO | Admitting: Orthopaedic Surgery

## 2023-05-29 VITALS — Ht 66.5 in | Wt 265.8 lb

## 2023-05-29 VITALS — Ht 66.5 in | Wt 260.0 lb

## 2023-05-29 DIAGNOSIS — G8929 Other chronic pain: Secondary | ICD-10-CM | POA: Diagnosis not present

## 2023-05-29 DIAGNOSIS — D689 Coagulation defect, unspecified: Secondary | ICD-10-CM

## 2023-05-29 DIAGNOSIS — M25561 Pain in right knee: Secondary | ICD-10-CM

## 2023-05-29 DIAGNOSIS — D2372 Other benign neoplasm of skin of left lower limb, including hip: Secondary | ICD-10-CM | POA: Diagnosis not present

## 2023-05-29 DIAGNOSIS — M79676 Pain in unspecified toe(s): Secondary | ICD-10-CM

## 2023-05-29 DIAGNOSIS — B351 Tinea unguium: Secondary | ICD-10-CM | POA: Diagnosis not present

## 2023-05-29 DIAGNOSIS — M25562 Pain in left knee: Secondary | ICD-10-CM

## 2023-05-29 DIAGNOSIS — D2371 Other benign neoplasm of skin of right lower limb, including hip: Secondary | ICD-10-CM | POA: Diagnosis not present

## 2023-05-29 MED ORDER — METHYLPREDNISOLONE ACETATE 40 MG/ML IJ SUSP
40.0000 mg | INTRAMUSCULAR | Status: AC | PRN
Start: 2023-05-29 — End: 2023-05-29
  Administered 2023-05-29: 40 mg via INTRA_ARTICULAR

## 2023-05-29 MED ORDER — LIDOCAINE HCL 1 % IJ SOLN
3.0000 mL | INTRAMUSCULAR | Status: AC | PRN
Start: 2023-05-29 — End: 2023-05-29
  Administered 2023-05-29: 3 mL

## 2023-05-29 NOTE — Progress Notes (Signed)
 She presents today chief complaint of painful elongated toenails.  Objective: Toenails are long thick yellow dystrophic clinically mycotic multiple benign skin lesions plantar aspect of the bilateral foot with strong palpable pulses.  Assessment: Currently on long-term blood thinner painful mycotic nails and painful benign skin lesions.  Plan: Debrided benign skin lesions debrided nails 1 through 5 bilateral.  Follow-up with her in 3 months

## 2023-05-29 NOTE — Progress Notes (Signed)
 The patient is well-known to me.  She has quite severe arthritis in both of her knees.  She 73 years old and comes in about every 3 months for steroid injections in both knees.  When we saw her back in November her BMI was 45.  She does have a pacemaker and is on Coumadin .  She has not been a surgical candidate due to the combination of her significant obesity and her comorbidities.  However she continues to be on a weight loss journey and is now starting to lose enough weight to be reconsidered for knee replacement surgery if she can be medically cleared.  It will still be a complicated surgery given the soft tissue around her knees but she is heading in the right direction.  Her BMI today is down to 41.34.  She is not a diabetic.  She does state the steroid injections of helped her.  She does with a walker.  Again her soft tissue envelope around both knees is large but not insurmountable in terms of surgical intervention if it comes down to it.  I did place a sterile injection in both knees today which she tolerated well.  She does see her cardiologist sometime in the next 2 weeks.  I would like her to ask him about the possibility of eventual clearance for surgery.  She would likely need to have her Coumadin  stopped about 5 days before surgery and probably being bridged with Lovenox  if they think that that is appropriate from a cardiac standpoint with her pacemaker.  We will still see her back in 3 months to consider repeat injections and a new weight and BMI calculation.  If she does get down to below 40 BMI, we can schedule her for knee replacement surgery.    Procedure Note  Patient: Rhonda Acevedo             Date of Birth: 1950/02/04           MRN: [id number]             Visit Date: 05/29/2023  Procedures: Visit Diagnoses:  1. Chronic pain of left knee   2. Chronic pain of right knee   3. Severe obesity (BMI >= 40) (HCC)     Large Joint Inj: R knee on 05/29/2023 2:21 PM Indications:  diagnostic evaluation and pain Details: 22 G 1.5 in needle, superolateral approach  Arthrogram: No  Medications: 3 mL lidocaine  1 %; 40 mg methylPREDNISolone  acetate 40 MG/ML Outcome: tolerated well, no immediate complications Procedure, treatment alternatives, risks and benefits explained, specific risks discussed. Consent was given by the patient. Immediately prior to procedure a time out was called to verify the correct patient, procedure, equipment, support staff and site/side marked as required. Patient was prepped and draped in the usual sterile fashion.    Large Joint Inj: L knee on 05/29/2023 2:21 PM Indications: diagnostic evaluation and pain Details: 22 G 1.5 in needle, superolateral approach  Arthrogram: No  Medications: 3 mL lidocaine  1 %; 40 mg methylPREDNISolone  acetate 40 MG/ML Outcome: tolerated well, no immediate complications Procedure, treatment alternatives, risks and benefits explained, specific risks discussed. Consent was given by the patient. Immediately prior to procedure a time out was called to verify the correct patient, procedure, equipment, support staff and site/side marked as required. Patient was prepped and draped in the usual sterile fashion.

## 2023-06-05 DIAGNOSIS — I4891 Unspecified atrial fibrillation: Secondary | ICD-10-CM | POA: Diagnosis not present

## 2023-06-05 DIAGNOSIS — D649 Anemia, unspecified: Secondary | ICD-10-CM | POA: Diagnosis not present

## 2023-06-05 DIAGNOSIS — Z0000 Encounter for general adult medical examination without abnormal findings: Secondary | ICD-10-CM | POA: Diagnosis not present

## 2023-06-05 DIAGNOSIS — I5032 Chronic diastolic (congestive) heart failure: Secondary | ICD-10-CM | POA: Diagnosis not present

## 2023-06-05 DIAGNOSIS — E7800 Pure hypercholesterolemia, unspecified: Secondary | ICD-10-CM | POA: Diagnosis not present

## 2023-06-05 DIAGNOSIS — G4733 Obstructive sleep apnea (adult) (pediatric): Secondary | ICD-10-CM | POA: Diagnosis not present

## 2023-06-05 DIAGNOSIS — I10 Essential (primary) hypertension: Secondary | ICD-10-CM | POA: Diagnosis not present

## 2023-06-05 DIAGNOSIS — Z1331 Encounter for screening for depression: Secondary | ICD-10-CM | POA: Diagnosis not present

## 2023-06-05 DIAGNOSIS — G8929 Other chronic pain: Secondary | ICD-10-CM | POA: Diagnosis not present

## 2023-06-05 DIAGNOSIS — R7303 Prediabetes: Secondary | ICD-10-CM | POA: Diagnosis not present

## 2023-06-05 DIAGNOSIS — Z7901 Long term (current) use of anticoagulants: Secondary | ICD-10-CM | POA: Diagnosis not present

## 2023-06-09 ENCOUNTER — Ambulatory Visit: Attending: Internal Medicine | Admitting: Internal Medicine

## 2023-06-09 ENCOUNTER — Ambulatory Visit: Payer: PPO | Admitting: Internal Medicine

## 2023-06-09 ENCOUNTER — Encounter: Payer: Self-pay | Admitting: Internal Medicine

## 2023-06-09 VITALS — BP 132/90 | HR 70 | Ht 66.5 in | Wt 252.0 lb

## 2023-06-09 DIAGNOSIS — I480 Paroxysmal atrial fibrillation: Secondary | ICD-10-CM | POA: Diagnosis not present

## 2023-06-09 LAB — CUP PACEART INCLINIC DEVICE CHECK
Brady Statistic RA Percent Paced: 88.5 %
Brady Statistic RV Percent Paced: 0.2 %
Date Time Interrogation Session: 20250519140204
Implantable Lead Connection Status: 753985
Implantable Lead Connection Status: 753985
Implantable Lead Implant Date: 20240626
Implantable Lead Implant Date: 20240626
Implantable Lead Location: 753859
Implantable Lead Location: 753860
Implantable Lead Model: 5076
Implantable Lead Model: 5076
Implantable Pulse Generator Implant Date: 20240626

## 2023-06-09 NOTE — Progress Notes (Signed)
 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 She has PAF treated with flecainide . She underwent PPM insertion almost a year ago. In the interim, she notes occaisional episodes of palpitations. She was tried on some beta blocker but developed hypotension.  Continue flecainide . Allergies  Allergen Reactions   Adhesive [Tape] Itching and Rash   Benazepril Hcl Cough     Current Outpatient Medications  Medication Sig Dispense Refill   flecainide  (TAMBOCOR ) 100 MG tablet Take 1 tablet (100 mg total) by mouth 2 (two) times daily. 60 tablet 3   hydrOXYzine (ATARAX/VISTARIL) 25 MG tablet Take 25 mg by mouth daily.     midodrine  (PROAMATINE ) 2.5 MG tablet Take 1 tablet (2.5 mg total) by mouth 2 (two) times daily with a meal. 60 tablet 3   oxybutynin  (DITROPAN ) 5 MG tablet Take 5 mg by mouth 2 (two) times daily.     pravastatin  (PRAVACHOL ) 80 MG tablet Take 80 mg by mouth at bedtime.      traMADol  (ULTRAM ) 50 MG tablet Take 50 mg by mouth 3 (three) times daily as needed for severe pain.     triamcinolone ointment (KENALOG) 0.5 % Apply 1 Application topically 2 (two) times daily as needed (irritation).     warfarin (COUMADIN ) 5 MG tablet Take 5 mg by mouth every Monday, Wednesday, and Friday.     warfarin (COUMADIN ) 7.5 MG tablet Take 7.5 mg by mouth every Tuesday, Thursday, Saturday, and Sunday. (Patient not taking: Reported on 06/09/2023)     No current facility-administered medications for this visit.     Past Medical History:  Diagnosis Date   Allergic rhinitis    Esophageal reflux    Essential hypertension    Factor V Leiden mutation (HCC)    GERD (gastroesophageal reflux disease)    History of cardiac catheterization    Normal coronaries July 2015   History of DVT (deep vein thrombosis)    History of pulmonary embolism    Multiple, status post IVC filter, on Coumadin    Mixed hyperlipidemia    Obstructive sleep apnea    NPSG  05-29-07; AHI 32.9,cpap 7 to 9   On home oxygen  therapy    2 liters at night- usual- no recent changes   Osteoarthritis    Persistent atrial fibrillation [facility])    Documented October 2016   Transfusion history    Last transfusion after umbilical hernia- [facility]    ROS:   All systems reviewed and negative except as noted in the HPI.   Past Surgical History:  Procedure Laterality Date   CHOLECYSTECTOMY     COLONOSCOPY WITH PROPOFOL  N/A 06/13/2015   Procedure: COLONOSCOPY WITH PROPOFOL ;  Surgeon: Garrett Kallman, MD;  Location: [facility] ENDOSCOPY;  Service: Endoscopy;  Laterality: N/A;   ENDOMETRIAL ABLATION     GANGLION CYST EXCISION     Hernia abscess     HERNIA REPAIR     umbilical   I&D hernia incisional abscess     IR ABLATE LIVER CRYOABLATION  05/12/2019   IR RADIOLOGIST EVAL & MGMT  05/10/2019   IVC filter Right    LEFT HEART CATHETERIZATION WITH CORONARY ANGIOGRAM N/A 08/20/2013   Procedure: LEFT HEART CATHETERIZATION WITH CORONARY ANGIOGRAM;  Surgeon: Arlander Bellman, MD;  Location: [facility] CATH LAB;  Service: Cardiovascular;  Laterality: N/A;   PACEMAKER IMPLANT N/A 07/17/2022   Procedure: PACEMAKER IMPLANT;  Surgeon: Tammie Fall, MD;  Location: [facility] INVASIVE CV LAB;  Service: Cardiovascular;  Laterality: N/A;   ROTATOR CUFF REPAIR       Family History  Problem Relation Age of Onset   Cancer Mother        Thyroid  cancer   Heart disease Father        Heart attack   Allergies Other    Asthma Other    COPD Other      Social History   Socioeconomic History   Marital status: Widowed    Spouse name: Not on file   Number of children: Not on file   Years of education: Not on file   Highest education level: Not on file  Occupational History   Occupation: Disability  Tobacco Use   Smoking status: Never   Smokeless tobacco: Never   Tobacco comments:    only as a teen for 6 months  Vaping Use   Vaping status: Never Used  Substance and Sexual Activity   Alcohol  use: No    Alcohol/week: 0.0 standard drinks of alcohol   Drug use: No   Sexual activity: Not on file  Other Topics Concern   Not on file  Social History Narrative   Widowed-cirrhosis   Social Drivers of Health   Financial Resource Strain: Not on file  Food Insecurity: Not on file  Transportation Needs: Not on file  Physical Activity: Not on file  Stress: Not on file  Social Connections: Not on file  Intimate Partner Violence: Not on file     BP (!) 132/90   Pulse 70   Ht 5' 6.5" (1.689 m)   Wt 252 lb (114.3 kg)   SpO2 96%   BMI 40.06 kg/m   Physical Exam:  Well appearing NAD HEENT: Unremarkable Neck:  No JVD, no thyromegally Lymphatics:  No adenopathy Back:  No CVA tenderness Lungs:  Clear with no wheezes HEART:  Regular rate rhythm, no murmurs, no rubs, no clicks Abd:  soft, positive bowel sounds, no organomegally, no rebound, no guarding Ext:  2 plus pulses, no edema, no cyanosis, no clubbing Skin:  No rashes no nodules Neuro:  CN II through XII intact, motor grossly intact  EKG - NSR with atrial pacing  DEVICE  Normal device function.  See PaceArt for details.   Assess/Plan: Sinus node dysfunction - she is stable s/p PPM insertion.  Obesity - she has lost weight. She will continue her current meds. PPM -her Medtronic DDD PM is working normally. Preop - she is an acceptable surgical candidate for possible joint replacement. She can stop her coumadin  4-5 days before the procedure and restart after when the bleeding risk is acceptable.  Pete Brand Ulyssa Walthour,MD

## 2023-06-09 NOTE — Patient Instructions (Signed)
 Medication Instructions:  Your physician recommends that you continue on your current medications as directed. Please refer to the Current Medication list given to you today.  *If you need a refill on your cardiac medications before your next appointment, please call your pharmacy*  Lab Work: None ordered.  You may go to any [facility] Location for your lab work:  [facility] - [address] ([facility]) - [address] [id number] [address]  [place] - [address]   [place]  - [address]   [place] - [address] - [address] [employer]  - [address] - [address] ([facility]   If you have labs (blood work) drawn today and your tests are completely normal, you will receive your results only by: [employer] (if you have MyChart)  If you have any lab test that is abnormal or we need to change your treatment, we will call you or send a MyChart message to review the results.  Testing/Procedures: None ordered.  Follow-Up: At [facility], you and your health needs are our priority.  As part of our continuing mission to provide you with exceptional heart care, we have created designated Provider Care Teams.  These Care Teams include your primary Cardiologist (physician) and Advanced Practice Providers (APPs -  Physician Assistants and Nurse Practitioners) who all work together to provide you with the care you need, when you need it.  Your next appointment:   1 year(s)  The format for your next appointment:   In Person  Provider:   Manya Sells, MD{or one of the following Advanced Practice Providers on your designated Care Team:   Rhonda Acevedo, [place] Rhonda Acevedo "Rhonda Acevedo" [place], [place] Rhonda Balk, NP  Note: Remote monitoring is used to monitor your Pacemaker/ ICD from home. This monitoring reduces the number of office visits required to check your  device to one time per year. It allows us  to keep an eye on the functioning of your device to ensure it is working properly.

## 2023-06-11 ENCOUNTER — Telehealth: Payer: Self-pay

## 2023-06-11 NOTE — Telephone Encounter (Addendum)
 Alert remote transmission:  AT/AF burden > threshold Presenting rhythm AF/AFL in progress from 5/21 @ 06:10 with elevated V rates irregular R-R 80's - 140's. Typically has elevated v rates when in AF. AT/AF burden low at 1.3%.    Patient known hx of PAF, saw Dr. Carolynne Citron on 06/09/23 in [facility] on flecainide  and Warfarin.  Will recheck a remote transmission in 7 days to re-eval burden. Plans to f/u with patient if still in AF.

## 2023-06-19 ENCOUNTER — Ambulatory Visit (INDEPENDENT_AMBULATORY_CARE_PROVIDER_SITE_OTHER)

## 2023-06-19 DIAGNOSIS — I495 Sick sinus syndrome: Secondary | ICD-10-CM

## 2023-06-20 NOTE — Telephone Encounter (Signed)
 PPM Unscheduled remote transmission: scheduled non-billable to reassess AF burden and rate control Normal device function Presenting AP-VS. 3 new AHR, all on 06/11/23, longest 2.5 hours, EGMs consistent with AF, known history on warfarin per Epic. Follow up as scheduled - CS, CV[facility]   Transmission received. No ATAF since 5//21. Presenting APVS. No f/u necessary per 5/21 note.

## 2023-07-03 DIAGNOSIS — Z7901 Long term (current) use of anticoagulants: Secondary | ICD-10-CM | POA: Diagnosis not present

## 2023-07-08 DIAGNOSIS — I4891 Unspecified atrial fibrillation: Secondary | ICD-10-CM | POA: Diagnosis not present

## 2023-07-08 DIAGNOSIS — I10 Essential (primary) hypertension: Secondary | ICD-10-CM | POA: Diagnosis not present

## 2023-07-08 DIAGNOSIS — I5032 Chronic diastolic (congestive) heart failure: Secondary | ICD-10-CM | POA: Diagnosis not present

## 2023-07-13 ENCOUNTER — Other Ambulatory Visit: Payer: Self-pay | Admitting: Cardiology

## 2023-07-17 ENCOUNTER — Ambulatory Visit: Payer: PPO

## 2023-07-17 DIAGNOSIS — I495 Sick sinus syndrome: Secondary | ICD-10-CM | POA: Diagnosis not present

## 2023-07-17 LAB — CUP PACEART REMOTE DEVICE CHECK
Battery Remaining Longevity: 150 mo
Battery Voltage: 3.08 V
Brady Statistic AP VP Percent: 0.07 %
Brady Statistic AP VS Percent: 89.79 %
Brady Statistic AS VP Percent: 0 %
Brady Statistic AS VS Percent: 10.14 %
Brady Statistic RA Percent Paced: 89.17 %
Brady Statistic RV Percent Paced: 0.1 %
Date Time Interrogation Session: 20250625231137
Implantable Lead Connection Status: 753985
Implantable Lead Connection Status: 753985
Implantable Lead Implant Date: 20240626
Implantable Lead Implant Date: 20240626
Implantable Lead Location: 753859
Implantable Lead Location: 753860
Implantable Lead Model: 5076
Implantable Lead Model: 5076
Implantable Pulse Generator Implant Date: 20240626
Lead Channel Impedance Value: 323 Ohm
Lead Channel Impedance Value: 361 Ohm
Lead Channel Impedance Value: 418 Ohm
Lead Channel Impedance Value: 532 Ohm
Lead Channel Pacing Threshold Amplitude: 0.5 V
Lead Channel Pacing Threshold Amplitude: 1 V
Lead Channel Pacing Threshold Pulse Width: 0.4 ms
Lead Channel Pacing Threshold Pulse Width: 0.4 ms
Lead Channel Sensing Intrinsic Amplitude: 1.75 mV
Lead Channel Sensing Intrinsic Amplitude: 1.75 mV
Lead Channel Sensing Intrinsic Amplitude: 16 mV
Lead Channel Sensing Intrinsic Amplitude: 16 mV
Lead Channel Setting Pacing Amplitude: 1.5 V
Lead Channel Setting Pacing Amplitude: 2.5 V
Lead Channel Setting Pacing Pulse Width: 0.4 ms
Lead Channel Setting Sensing Sensitivity: 1.2 mV
Zone Setting Status: 755011

## 2023-07-18 DIAGNOSIS — Z7901 Long term (current) use of anticoagulants: Secondary | ICD-10-CM | POA: Diagnosis not present

## 2023-07-20 ENCOUNTER — Ambulatory Visit: Payer: Self-pay | Admitting: Internal Medicine

## 2023-07-21 DIAGNOSIS — I10 Essential (primary) hypertension: Secondary | ICD-10-CM | POA: Diagnosis not present

## 2023-07-21 DIAGNOSIS — J4520 Mild intermittent asthma, uncomplicated: Secondary | ICD-10-CM | POA: Diagnosis not present

## 2023-07-21 DIAGNOSIS — I4891 Unspecified atrial fibrillation: Secondary | ICD-10-CM | POA: Diagnosis not present

## 2023-07-21 DIAGNOSIS — J4540 Moderate persistent asthma, uncomplicated: Secondary | ICD-10-CM | POA: Diagnosis not present

## 2023-07-21 DIAGNOSIS — I5032 Chronic diastolic (congestive) heart failure: Secondary | ICD-10-CM | POA: Diagnosis not present

## 2023-08-06 DIAGNOSIS — I4891 Unspecified atrial fibrillation: Secondary | ICD-10-CM | POA: Diagnosis not present

## 2023-08-06 DIAGNOSIS — I10 Essential (primary) hypertension: Secondary | ICD-10-CM | POA: Diagnosis not present

## 2023-08-06 DIAGNOSIS — I5032 Chronic diastolic (congestive) heart failure: Secondary | ICD-10-CM | POA: Diagnosis not present

## 2023-08-08 NOTE — Progress Notes (Signed)
 Remote pacemaker transmission.

## 2023-08-13 DIAGNOSIS — Z7901 Long term (current) use of anticoagulants: Secondary | ICD-10-CM | POA: Diagnosis not present

## 2023-08-21 DIAGNOSIS — J4540 Moderate persistent asthma, uncomplicated: Secondary | ICD-10-CM | POA: Diagnosis not present

## 2023-08-21 DIAGNOSIS — I4891 Unspecified atrial fibrillation: Secondary | ICD-10-CM | POA: Diagnosis not present

## 2023-08-21 DIAGNOSIS — I5032 Chronic diastolic (congestive) heart failure: Secondary | ICD-10-CM | POA: Diagnosis not present

## 2023-08-21 DIAGNOSIS — I10 Essential (primary) hypertension: Secondary | ICD-10-CM | POA: Diagnosis not present

## 2023-08-21 DIAGNOSIS — J4520 Mild intermittent asthma, uncomplicated: Secondary | ICD-10-CM | POA: Diagnosis not present

## 2023-08-27 ENCOUNTER — Encounter: Payer: Self-pay | Admitting: Cardiology

## 2023-09-04 ENCOUNTER — Encounter: Payer: Self-pay | Admitting: Podiatry

## 2023-09-04 ENCOUNTER — Ambulatory Visit: Admitting: Podiatry

## 2023-09-04 ENCOUNTER — Ambulatory Visit (INDEPENDENT_AMBULATORY_CARE_PROVIDER_SITE_OTHER): Admitting: Orthopaedic Surgery

## 2023-09-04 ENCOUNTER — Encounter: Payer: Self-pay | Admitting: Orthopaedic Surgery

## 2023-09-04 VITALS — Ht 66.5 in | Wt 244.0 lb

## 2023-09-04 DIAGNOSIS — Z86711 Personal history of pulmonary embolism: Secondary | ICD-10-CM | POA: Insufficient documentation

## 2023-09-04 DIAGNOSIS — G8929 Other chronic pain: Secondary | ICD-10-CM | POA: Diagnosis not present

## 2023-09-04 DIAGNOSIS — D2372 Other benign neoplasm of skin of left lower limb, including hip: Secondary | ICD-10-CM | POA: Diagnosis not present

## 2023-09-04 DIAGNOSIS — D6869 Other thrombophilia: Secondary | ICD-10-CM | POA: Insufficient documentation

## 2023-09-04 DIAGNOSIS — M25562 Pain in left knee: Secondary | ICD-10-CM

## 2023-09-04 DIAGNOSIS — M79676 Pain in unspecified toe(s): Secondary | ICD-10-CM | POA: Diagnosis not present

## 2023-09-04 DIAGNOSIS — Z860101 Personal history of adenomatous and serrated colon polyps: Secondary | ICD-10-CM | POA: Insufficient documentation

## 2023-09-04 DIAGNOSIS — D2371 Other benign neoplasm of skin of right lower limb, including hip: Secondary | ICD-10-CM

## 2023-09-04 DIAGNOSIS — Z950 Presence of cardiac pacemaker: Secondary | ICD-10-CM | POA: Insufficient documentation

## 2023-09-04 DIAGNOSIS — D689 Coagulation defect, unspecified: Secondary | ICD-10-CM

## 2023-09-04 DIAGNOSIS — I10 Essential (primary) hypertension: Secondary | ICD-10-CM | POA: Insufficient documentation

## 2023-09-04 DIAGNOSIS — M25561 Pain in right knee: Secondary | ICD-10-CM

## 2023-09-04 DIAGNOSIS — J309 Allergic rhinitis, unspecified: Secondary | ICD-10-CM | POA: Insufficient documentation

## 2023-09-04 DIAGNOSIS — I509 Heart failure, unspecified: Secondary | ICD-10-CM | POA: Insufficient documentation

## 2023-09-04 DIAGNOSIS — M1711 Unilateral primary osteoarthritis, right knee: Secondary | ICD-10-CM | POA: Diagnosis not present

## 2023-09-04 DIAGNOSIS — R739 Hyperglycemia, unspecified: Secondary | ICD-10-CM | POA: Insufficient documentation

## 2023-09-04 DIAGNOSIS — K5900 Constipation, unspecified: Secondary | ICD-10-CM | POA: Insufficient documentation

## 2023-09-04 DIAGNOSIS — J4520 Mild intermittent asthma, uncomplicated: Secondary | ICD-10-CM | POA: Insufficient documentation

## 2023-09-04 DIAGNOSIS — M1712 Unilateral primary osteoarthritis, left knee: Secondary | ICD-10-CM

## 2023-09-04 DIAGNOSIS — D3500 Benign neoplasm of unspecified adrenal gland: Secondary | ICD-10-CM | POA: Insufficient documentation

## 2023-09-04 DIAGNOSIS — R7303 Prediabetes: Secondary | ICD-10-CM | POA: Insufficient documentation

## 2023-09-04 DIAGNOSIS — B351 Tinea unguium: Secondary | ICD-10-CM | POA: Diagnosis not present

## 2023-09-04 DIAGNOSIS — Z7901 Long term (current) use of anticoagulants: Secondary | ICD-10-CM | POA: Insufficient documentation

## 2023-09-04 MED ORDER — LIDOCAINE HCL 1 % IJ SOLN
3.0000 mL | INTRAMUSCULAR | Status: AC | PRN
Start: 2023-09-04 — End: 2023-09-04
  Administered 2023-09-04: 3 mL

## 2023-09-04 MED ORDER — METHYLPREDNISOLONE ACETATE 40 MG/ML IJ SUSP
40.0000 mg | INTRAMUSCULAR | Status: AC | PRN
Start: 2023-09-04 — End: 2023-09-04
  Administered 2023-09-04: 40 mg via INTRA_ARTICULAR

## 2023-09-04 NOTE — Progress Notes (Signed)
 She presents today chief complaint of painful elongated toenails.  Objective: Toenails are long thick yellow dystrophic clinically mycotic multiple benign skin lesions plantar aspect of the bilateral foot with strong palpable pulses.  Assessment: Currently on long-term blood thinner painful mycotic nails and painful benign skin lesions.  Plan: Debrided benign skin lesions debrided nails 1 through 5 bilateral.  Follow-up with her in 3 months

## 2023-09-04 NOTE — Progress Notes (Signed)
 The patient is well-known to us .  She is a 73 year old female with debilitating arthritis in both of her knees.  She does ambulate with a walker.  She has been on a weight loss journey and her BMI is now down to 38.79.  She has lost over 100 pounds.  She would like to have steroid injections in both her knees today.  Is been 3 months since her last steroid injections and she is not a diabetic.  She is still taking care of her daughter who has had bilateral meniscectomies so she said right now she is not interested in proceeding with any type of knee replacement as of yet.  She is on Coumadin .  I did see notes from Dr. Waddell her interventional cardiologist who did give clearance for surgery if it comes to that point.  Examination of both knees today shows much less soft tissue around the knees femoral and we started seeing her when she was so significantly obese.  She does have pain globally with both knees and previous x-rays in 2021 show bone-on-bone arthritis of both knees.  Per the patient's request I did place a steroid injection of both knees which she tolerated well.  We can repeat this in 3 months if needed.    Procedure Note  Patient: Rhonda Acevedo             Date of Birth: 04-Jan-1951           MRN: [id number]             Visit Date: 09/04/2023  Procedures: Visit Diagnoses:  1. Chronic pain of left knee   2. Chronic pain of right knee   3. Unilateral primary osteoarthritis, right knee   4. Unilateral primary osteoarthritis, left knee     Large Joint Inj: R knee on 09/04/2023 1:57 PM Indications: diagnostic evaluation and pain Details: 22 G 1.5 in needle, superolateral approach  Arthrogram: No  Medications: 3 mL lidocaine  1 %; 40 mg methylPREDNISolone  acetate 40 MG/ML Outcome: tolerated well, no immediate complications Procedure, treatment alternatives, risks and benefits explained, specific risks discussed. Consent was given by the patient. Immediately prior to procedure a  time out was called to verify the correct patient, procedure, equipment, support staff and site/side marked as required. Patient was prepped and draped in the usual sterile fashion.    Large Joint Inj: L knee on 09/04/2023 1:57 PM Indications: diagnostic evaluation and pain Details: 22 G 1.5 in needle, superolateral approach  Arthrogram: No  Medications: 3 mL lidocaine  1 %; 40 mg methylPREDNISolone  acetate 40 MG/ML Outcome: tolerated well, no immediate complications Procedure, treatment alternatives, risks and benefits explained, specific risks discussed. Consent was given by the patient. Immediately prior to procedure a time out was called to verify the correct patient, procedure, equipment, support staff and site/side marked as required. Patient was prepped and draped in the usual sterile fashion.

## 2023-09-05 DIAGNOSIS — I5032 Chronic diastolic (congestive) heart failure: Secondary | ICD-10-CM | POA: Diagnosis not present

## 2023-09-05 DIAGNOSIS — I10 Essential (primary) hypertension: Secondary | ICD-10-CM | POA: Diagnosis not present

## 2023-09-05 DIAGNOSIS — I4891 Unspecified atrial fibrillation: Secondary | ICD-10-CM | POA: Diagnosis not present

## 2023-09-10 DIAGNOSIS — Z7901 Long term (current) use of anticoagulants: Secondary | ICD-10-CM | POA: Diagnosis not present

## 2023-09-21 DIAGNOSIS — I5032 Chronic diastolic (congestive) heart failure: Secondary | ICD-10-CM | POA: Diagnosis not present

## 2023-09-21 DIAGNOSIS — I10 Essential (primary) hypertension: Secondary | ICD-10-CM | POA: Diagnosis not present

## 2023-09-21 DIAGNOSIS — I4891 Unspecified atrial fibrillation: Secondary | ICD-10-CM | POA: Diagnosis not present

## 2023-09-21 DIAGNOSIS — J4540 Moderate persistent asthma, uncomplicated: Secondary | ICD-10-CM | POA: Diagnosis not present

## 2023-09-21 DIAGNOSIS — J4520 Mild intermittent asthma, uncomplicated: Secondary | ICD-10-CM | POA: Diagnosis not present

## 2023-09-24 DIAGNOSIS — Z7901 Long term (current) use of anticoagulants: Secondary | ICD-10-CM | POA: Diagnosis not present

## 2023-10-05 DIAGNOSIS — I5032 Chronic diastolic (congestive) heart failure: Secondary | ICD-10-CM | POA: Diagnosis not present

## 2023-10-05 DIAGNOSIS — I10 Essential (primary) hypertension: Secondary | ICD-10-CM | POA: Diagnosis not present

## 2023-10-05 DIAGNOSIS — I4891 Unspecified atrial fibrillation: Secondary | ICD-10-CM | POA: Diagnosis not present

## 2023-10-16 ENCOUNTER — Ambulatory Visit (INDEPENDENT_AMBULATORY_CARE_PROVIDER_SITE_OTHER): Payer: PPO

## 2023-10-16 DIAGNOSIS — I495 Sick sinus syndrome: Secondary | ICD-10-CM

## 2023-10-16 LAB — CUP PACEART REMOTE DEVICE CHECK
Battery Remaining Longevity: 145 mo
Battery Voltage: 3.05 V
Brady Statistic AP VP Percent: 0.08 %
Brady Statistic AP VS Percent: 91.07 %
Brady Statistic AS VP Percent: 0 %
Brady Statistic AS VS Percent: 8.85 %
Brady Statistic RA Percent Paced: 91.18 %
Brady Statistic RV Percent Paced: 0.08 %
Date Time Interrogation Session: 20250924224951
Implantable Lead Connection Status: 753985
Implantable Lead Connection Status: 753985
Implantable Lead Implant Date: 20240626
Implantable Lead Implant Date: 20240626
Implantable Lead Location: 753859
Implantable Lead Location: 753860
Implantable Lead Model: 5076
Implantable Lead Model: 5076
Implantable Pulse Generator Implant Date: 20240626
Lead Channel Impedance Value: 323 Ohm
Lead Channel Impedance Value: 342 Ohm
Lead Channel Impedance Value: 380 Ohm
Lead Channel Impedance Value: 532 Ohm
Lead Channel Pacing Threshold Amplitude: 0.5 V
Lead Channel Pacing Threshold Amplitude: 1.375 V
Lead Channel Pacing Threshold Pulse Width: 0.4 ms
Lead Channel Pacing Threshold Pulse Width: 0.4 ms
Lead Channel Sensing Intrinsic Amplitude: 11.125 mV
Lead Channel Sensing Intrinsic Amplitude: 11.125 mV
Lead Channel Sensing Intrinsic Amplitude: 2.375 mV
Lead Channel Sensing Intrinsic Amplitude: 2.375 mV
Lead Channel Setting Pacing Amplitude: 1.5 V
Lead Channel Setting Pacing Amplitude: 3 V
Lead Channel Setting Pacing Pulse Width: 0.4 ms
Lead Channel Setting Sensing Sensitivity: 1.2 mV
Zone Setting Status: 755011

## 2023-10-17 NOTE — Progress Notes (Signed)
 Remote pacemaker transmission.

## 2023-10-19 ENCOUNTER — Ambulatory Visit: Payer: Self-pay | Admitting: Internal Medicine

## 2023-10-20 NOTE — Progress Notes (Signed)
 Remote PPM Transmission

## 2023-10-21 ENCOUNTER — Encounter: Payer: Self-pay | Admitting: Cardiology

## 2023-10-21 ENCOUNTER — Ambulatory Visit: Attending: Cardiology | Admitting: Cardiology

## 2023-10-21 VITALS — BP 140/90 | HR 74 | Ht 66.5 in | Wt 241.0 lb

## 2023-10-21 DIAGNOSIS — I5032 Chronic diastolic (congestive) heart failure: Secondary | ICD-10-CM | POA: Diagnosis not present

## 2023-10-21 DIAGNOSIS — I495 Sick sinus syndrome: Secondary | ICD-10-CM

## 2023-10-21 DIAGNOSIS — D6851 Activated protein C resistance: Secondary | ICD-10-CM | POA: Diagnosis not present

## 2023-10-21 DIAGNOSIS — J4540 Moderate persistent asthma, uncomplicated: Secondary | ICD-10-CM | POA: Diagnosis not present

## 2023-10-21 DIAGNOSIS — I480 Paroxysmal atrial fibrillation: Secondary | ICD-10-CM

## 2023-10-21 DIAGNOSIS — I10 Essential (primary) hypertension: Secondary | ICD-10-CM | POA: Diagnosis not present

## 2023-10-21 DIAGNOSIS — I4891 Unspecified atrial fibrillation: Secondary | ICD-10-CM | POA: Diagnosis not present

## 2023-10-21 DIAGNOSIS — J4520 Mild intermittent asthma, uncomplicated: Secondary | ICD-10-CM | POA: Diagnosis not present

## 2023-10-21 NOTE — Patient Instructions (Signed)
 Medication Instructions:   STOP Midodrine    Labwork: None today  Testing/Procedures: None today  Follow-Up: 6 months  Any Other Special Instructions Will Be Listed Below (If Applicable).  If you need a refill on your cardiac medications before your next appointment, please call your pharmacy.

## 2023-10-21 NOTE — Progress Notes (Signed)
    Cardiology Office Note  Date: 10/21/2023   ID: Rhonda, Acevedo Feb 27, 1950, MRN [id number]  History of Present Illness: Rhonda Acevedo is a 73 y.o. female last seen in April by Ms. Strader PA-C, I reviewed her note.  She is here for a routine visit.  States that she has been doing fairly well over the last few months.  No sudden dizziness or syncope, no sense of palpitations.  Medtronic pacemaker in place with follow-up by Dr. Waddell.  Recent device interrogation indicates normal function.  She saw Dr. Waddell back in May.  She has had very low atrial fibrillation burden since we increased flecainide  to 100 mg twice daily.  Medications reviewed.  She continues on Coumadin  with follow-up by PCP.  No reported spontaneous bleeding problems.  Physical Exam: VS:  BP (!) 140/90   Pulse 74   Ht 5' 6.5 (1.689 m)   Wt 241 lb (109.3 kg)   SpO2 100%   BMI 38.32 kg/m , BMI Body mass index is 38.32 kg/m.  Wt Readings from Last 3 Encounters:  10/21/23 241 lb (109.3 kg)  09/04/23 244 lb (110.7 kg)  06/09/23 252 lb (114.3 kg)    General: Patient appears comfortable at rest. HEENT: Conjunctiva and lids normal. Neck: Supple, no elevated JVP or carotid bruits. Lungs: Clear to auscultation, nonlabored breathing at rest. Cardiac: Regular rate and rhythm, no S3, 1/6 systolic murmur.  ECG:  An ECG dated 06/09/2023 was personally reviewed today and demonstrated:  Atrial paced rhythm.  Labwork:  No interval cardiac testing for review today.  Other Studies Reviewed Today:  No interval cardiac testing for review today.  Assessment and Plan:  1.  Paroxysmal atrial fibrillation with CHA2DS2-VASc score of 3.  Better controlled since increasing flecainide  to 100 mg twice daily.  She is not on concurrent beta-blocker given prior intolerance.  She is on Coumadin  with follow-up by PCP and reports no spontaneous bleeding problems.  Episodes of relative hypotension have improved, no longer on  midodrine .   2.  Sinus node dysfunction postplacement of Medtronic dual-chamber pacemaker by Dr. Waddell in June 2024.   3.  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  She continues on lifelong anticoagulation.  Disposition:  Follow up 6 months.  Signed, Jayson JUDITHANN Sierras, M.D., F.A.C.C. [name]

## 2023-10-23 DIAGNOSIS — Z7901 Long term (current) use of anticoagulants: Secondary | ICD-10-CM | POA: Diagnosis not present

## 2023-11-04 DIAGNOSIS — I4891 Unspecified atrial fibrillation: Secondary | ICD-10-CM | POA: Diagnosis not present

## 2023-11-04 DIAGNOSIS — I5032 Chronic diastolic (congestive) heart failure: Secondary | ICD-10-CM | POA: Diagnosis not present

## 2023-11-04 DIAGNOSIS — I10 Essential (primary) hypertension: Secondary | ICD-10-CM | POA: Diagnosis not present

## 2023-11-20 DIAGNOSIS — Z7901 Long term (current) use of anticoagulants: Secondary | ICD-10-CM | POA: Diagnosis not present

## 2023-11-21 DIAGNOSIS — I4891 Unspecified atrial fibrillation: Secondary | ICD-10-CM | POA: Diagnosis not present

## 2023-11-21 DIAGNOSIS — I10 Essential (primary) hypertension: Secondary | ICD-10-CM | POA: Diagnosis not present

## 2023-11-21 DIAGNOSIS — J4540 Moderate persistent asthma, uncomplicated: Secondary | ICD-10-CM | POA: Diagnosis not present

## 2023-11-21 DIAGNOSIS — J4520 Mild intermittent asthma, uncomplicated: Secondary | ICD-10-CM | POA: Diagnosis not present

## 2023-11-21 DIAGNOSIS — I5032 Chronic diastolic (congestive) heart failure: Secondary | ICD-10-CM | POA: Diagnosis not present

## 2023-11-24 ENCOUNTER — Encounter: Payer: Self-pay | Admitting: Radiology

## 2023-12-04 DIAGNOSIS — I5032 Chronic diastolic (congestive) heart failure: Secondary | ICD-10-CM | POA: Diagnosis not present

## 2023-12-04 DIAGNOSIS — I10 Essential (primary) hypertension: Secondary | ICD-10-CM | POA: Diagnosis not present

## 2023-12-04 DIAGNOSIS — I4891 Unspecified atrial fibrillation: Secondary | ICD-10-CM | POA: Diagnosis not present

## 2023-12-07 ENCOUNTER — Other Ambulatory Visit: Payer: Self-pay | Admitting: Cardiology

## 2023-12-09 ENCOUNTER — Encounter: Payer: Self-pay | Admitting: Podiatry

## 2023-12-09 ENCOUNTER — Ambulatory Visit: Admitting: Podiatry

## 2023-12-09 DIAGNOSIS — M79676 Pain in unspecified toe(s): Secondary | ICD-10-CM

## 2023-12-09 DIAGNOSIS — D689 Coagulation defect, unspecified: Secondary | ICD-10-CM

## 2023-12-09 DIAGNOSIS — D2372 Other benign neoplasm of skin of left lower limb, including hip: Secondary | ICD-10-CM | POA: Diagnosis not present

## 2023-12-09 DIAGNOSIS — B351 Tinea unguium: Secondary | ICD-10-CM | POA: Diagnosis not present

## 2023-12-09 DIAGNOSIS — D2371 Other benign neoplasm of skin of right lower limb, including hip: Secondary | ICD-10-CM | POA: Diagnosis not present

## 2023-12-09 NOTE — Progress Notes (Signed)
 She presents today chief complaint of painful elongated toenails.  Objective: Toenails are long thick yellow dystrophic clinically mycotic multiple benign skin lesions plantar aspect of the bilateral foot with strong palpable pulses.  Assessment: Currently on long-term blood thinner painful mycotic nails and painful benign skin lesions.  Plan: Debrided benign skin lesions debrided nails 1 through 5 bilateral.  Follow-up with her in 3 months

## 2023-12-10 DIAGNOSIS — I495 Sick sinus syndrome: Secondary | ICD-10-CM | POA: Diagnosis not present

## 2023-12-10 DIAGNOSIS — I5032 Chronic diastolic (congestive) heart failure: Secondary | ICD-10-CM | POA: Diagnosis not present

## 2023-12-10 DIAGNOSIS — I10 Essential (primary) hypertension: Secondary | ICD-10-CM | POA: Diagnosis not present

## 2023-12-10 DIAGNOSIS — J9611 Chronic respiratory failure with hypoxia: Secondary | ICD-10-CM | POA: Diagnosis not present

## 2023-12-10 DIAGNOSIS — Z7901 Long term (current) use of anticoagulants: Secondary | ICD-10-CM | POA: Diagnosis not present

## 2023-12-10 DIAGNOSIS — J310 Chronic rhinitis: Secondary | ICD-10-CM | POA: Diagnosis not present

## 2023-12-10 DIAGNOSIS — G4733 Obstructive sleep apnea (adult) (pediatric): Secondary | ICD-10-CM | POA: Diagnosis not present

## 2023-12-10 DIAGNOSIS — R7303 Prediabetes: Secondary | ICD-10-CM | POA: Diagnosis not present

## 2023-12-10 DIAGNOSIS — D6869 Other thrombophilia: Secondary | ICD-10-CM | POA: Diagnosis not present

## 2023-12-10 DIAGNOSIS — E7800 Pure hypercholesterolemia, unspecified: Secondary | ICD-10-CM | POA: Diagnosis not present

## 2023-12-10 DIAGNOSIS — I480 Paroxysmal atrial fibrillation: Secondary | ICD-10-CM | POA: Diagnosis not present

## 2023-12-11 ENCOUNTER — Ambulatory Visit: Admitting: Orthopaedic Surgery

## 2023-12-21 DIAGNOSIS — J4520 Mild intermittent asthma, uncomplicated: Secondary | ICD-10-CM | POA: Diagnosis not present

## 2023-12-21 DIAGNOSIS — I10 Essential (primary) hypertension: Secondary | ICD-10-CM | POA: Diagnosis not present

## 2023-12-21 DIAGNOSIS — J4540 Moderate persistent asthma, uncomplicated: Secondary | ICD-10-CM | POA: Diagnosis not present

## 2023-12-21 DIAGNOSIS — I5032 Chronic diastolic (congestive) heart failure: Secondary | ICD-10-CM | POA: Diagnosis not present

## 2023-12-21 DIAGNOSIS — I4891 Unspecified atrial fibrillation: Secondary | ICD-10-CM | POA: Diagnosis not present

## 2024-01-05 ENCOUNTER — Ambulatory Visit: Admitting: Orthopaedic Surgery

## 2024-01-05 VITALS — Wt 245.0 lb

## 2024-01-05 DIAGNOSIS — G8929 Other chronic pain: Secondary | ICD-10-CM

## 2024-01-05 DIAGNOSIS — M170 Bilateral primary osteoarthritis of knee: Secondary | ICD-10-CM

## 2024-01-05 DIAGNOSIS — M1711 Unilateral primary osteoarthritis, right knee: Secondary | ICD-10-CM

## 2024-01-05 DIAGNOSIS — M1712 Unilateral primary osteoarthritis, left knee: Secondary | ICD-10-CM

## 2024-01-05 DIAGNOSIS — M25562 Pain in left knee: Secondary | ICD-10-CM

## 2024-01-05 DIAGNOSIS — M25561 Pain in right knee: Secondary | ICD-10-CM

## 2024-01-05 MED ORDER — METHYLPREDNISOLONE ACETATE 40 MG/ML IJ SUSP
40.0000 mg | INTRAMUSCULAR | Status: AC | PRN
  Administered 2024-01-05: 14:00:00 40 mg via INTRA_ARTICULAR

## 2024-01-05 MED ORDER — LIDOCAINE HCL 1 % IJ SOLN
3.0000 mL | INTRAMUSCULAR | Status: AC | PRN
  Administered 2024-01-05: 14:00:00 3 mL

## 2024-01-05 NOTE — Progress Notes (Signed)
 The patient is a 73 year old female well-known to us .  She has debilitating end-stage arthritis in both of her knees.  Her last x-rays were in 2021 showing complete loss of the joint space in all 3 compartments with the right knee being slight valgus on the left knee being slight varus.  Her BMI is down to 38.95.  She is requesting steroid injections in her knees today.  She has had these in the past.  She has waited 4 months between injections on this go around.  She has had no acute change in her medical status.  She does ambulate using a walker as well.  On exam both knees have a smaller soft tissue envelope in the past showing that she has lost weight.  Both knees have global tenderness and patellofemoral crepitation with known arthritis in both knees.  I did place a steroid injection of both knees today which she tolerated well.  We talked about the potential knee replacement surgery at some point she still not ready for this she states.  We can provide injections again in 3 to 4 months if needed.    Procedure Note  Patient: Rhonda Acevedo             Date of Birth: [birth year]           MRN: [id number]             Visit Date: 01/05/2024  Procedures: Visit Diagnoses:  1. Chronic pain of left knee   2. Chronic pain of right knee   3. Unilateral primary osteoarthritis, right knee   4. Unilateral primary osteoarthritis, left knee     Large Joint Inj: R knee on 01/05/2024 1:37 PM Indications: diagnostic evaluation and pain Details: 22 G 1.5 in needle, superolateral approach  Arthrogram: No  Medications: 3 mL lidocaine  1 %; 40 mg methylPREDNISolone  acetate 40 MG/ML Outcome: tolerated well, no immediate complications Procedure, treatment alternatives, risks and benefits explained, specific risks discussed. Consent was given by the patient. Immediately prior to procedure a time out was called to verify the correct patient, procedure, equipment, support staff and site/side marked as  required. Patient was prepped and draped in the usual sterile fashion.    Large Joint Inj: L knee on 01/05/2024 1:37 PM Indications: diagnostic evaluation and pain Details: 22 G 1.5 in needle, superolateral approach  Arthrogram: No  Medications: 3 mL lidocaine  1 %; 40 mg methylPREDNISolone  acetate 40 MG/ML Outcome: tolerated well, no immediate complications Procedure, treatment alternatives, risks and benefits explained, specific risks discussed. Consent was given by the patient. Immediately prior to procedure a time out was called to verify the correct patient, procedure, equipment, support staff and site/side marked as required. Patient was prepped and draped in the usual sterile fashion.

## 2024-01-07 ENCOUNTER — Encounter: Payer: Self-pay | Admitting: Cardiology

## 2024-01-08 ENCOUNTER — Other Ambulatory Visit: Payer: Self-pay

## 2024-01-08 DIAGNOSIS — E785 Hyperlipidemia, unspecified: Secondary | ICD-10-CM

## 2024-01-16 ENCOUNTER — Ambulatory Visit: Payer: PPO

## 2024-01-16 DIAGNOSIS — I495 Sick sinus syndrome: Secondary | ICD-10-CM | POA: Diagnosis not present

## 2024-01-18 ENCOUNTER — Ambulatory Visit: Payer: Self-pay | Admitting: Student in an Organized Health Care Education/Training Program

## 2024-01-18 LAB — CUP PACEART REMOTE DEVICE CHECK
Battery Remaining Longevity: 144 mo
Battery Voltage: 3.04 V
Brady Statistic AP VP Percent: 0.06 %
Brady Statistic AP VS Percent: 91.05 %
Brady Statistic AS VP Percent: 0 %
Brady Statistic AS VS Percent: 8.89 %
Brady Statistic RA Percent Paced: 91.14 %
Brady Statistic RV Percent Paced: 0.06 %
Date Time Interrogation Session: 20251225182742
Implantable Lead Connection Status: 753985
Implantable Lead Connection Status: 753985
Implantable Lead Implant Date: 20240626
Implantable Lead Implant Date: 20240626
Implantable Lead Location: 753859
Implantable Lead Location: 753860
Implantable Lead Model: 5076
Implantable Lead Model: 5076
Implantable Pulse Generator Implant Date: 20240626
Lead Channel Impedance Value: 361 Ohm
Lead Channel Impedance Value: 380 Ohm
Lead Channel Impedance Value: 456 Ohm
Lead Channel Impedance Value: 513 Ohm
Lead Channel Pacing Threshold Amplitude: 0.5 V
Lead Channel Pacing Threshold Amplitude: 1 V
Lead Channel Pacing Threshold Pulse Width: 0.4 ms
Lead Channel Pacing Threshold Pulse Width: 0.4 ms
Lead Channel Sensing Intrinsic Amplitude: 13.875 mV
Lead Channel Sensing Intrinsic Amplitude: 13.875 mV
Lead Channel Sensing Intrinsic Amplitude: 3 mV
Lead Channel Sensing Intrinsic Amplitude: 3 mV
Lead Channel Setting Pacing Amplitude: 1.5 V
Lead Channel Setting Pacing Amplitude: 2 V
Lead Channel Setting Pacing Pulse Width: 0.4 ms
Lead Channel Setting Sensing Sensitivity: 1.2 mV
Zone Setting Status: 755011

## 2024-01-19 NOTE — Progress Notes (Signed)
 Remote PPM Transmission

## 2024-01-31 ENCOUNTER — Encounter: Payer: Self-pay | Admitting: Cardiology

## 2024-02-12 ENCOUNTER — Ambulatory Visit (HOSPITAL_COMMUNITY)
Admission: RE | Admit: 2024-02-12 | Discharge: 2024-02-12 | Disposition: A | Discharge status: Home / Self Care | Payer: Self-pay | Source: Ambulatory Visit | Attending: Cardiology | Admitting: Cardiology

## 2024-02-12 DIAGNOSIS — E785 Hyperlipidemia, unspecified: Secondary | ICD-10-CM | POA: Insufficient documentation

## 2024-02-13 ENCOUNTER — Ambulatory Visit: Payer: Self-pay | Admitting: Cardiology

## 2024-02-13 DIAGNOSIS — R9439 Abnormal result of other cardiovascular function study: Secondary | ICD-10-CM

## 2024-02-13 DIAGNOSIS — Z79899 Other long term (current) drug therapy: Secondary | ICD-10-CM

## 2024-02-13 DIAGNOSIS — R9389 Abnormal findings on diagnostic imaging of other specified body structures: Secondary | ICD-10-CM

## 2024-02-19 ENCOUNTER — Encounter: Payer: Self-pay | Admitting: Cardiology

## 2024-02-20 NOTE — Telephone Encounter (Signed)
-----   Message from Jayson Sierras, MD sent at 02/16/2024  4:55 PM EST ----- See also radiology over-read of abdominal findings (noncardiac and most likely benign based on prior available imaging). Please send copy of complete results to PCP for follow-up.

## 2024-02-20 NOTE — Telephone Encounter (Signed)
 Results discussed with patient, agrees to lexiscan. Instructions for test discussed with her to include NPO 6 hours before test. She will take am meds with a sip of water only  She will get repeat Lipid at [facility]   CT results sent to Dr.Husain

## 2024-02-25 ENCOUNTER — Encounter (HOSPITAL_COMMUNITY): Payer: Self-pay

## 2024-02-26 ENCOUNTER — Encounter (HOSPITAL_COMMUNITY)

## 2024-02-26 ENCOUNTER — Inpatient Hospital Stay (HOSPITAL_COMMUNITY): Admission: RE | Admit: 2024-02-26

## 2024-03-04 ENCOUNTER — Other Ambulatory Visit (HOSPITAL_COMMUNITY)

## 2024-03-04 ENCOUNTER — Encounter (HOSPITAL_COMMUNITY)

## 2024-03-09 ENCOUNTER — Ambulatory Visit: Admitting: Podiatry

## 2024-03-29 ENCOUNTER — Ambulatory Visit: Admitting: Orthopaedic Surgery

## 2024-04-27 ENCOUNTER — Ambulatory Visit: Admitting: Cardiology

## 2024-05-28 ENCOUNTER — Ambulatory Visit: Admitting: Student in an Organized Health Care Education/Training Program
